# Patient Record
Sex: Male | Born: 1965
Health system: Southern US, Community
[De-identification: ages and names within clinical notes are randomized; demographics above are authoritative.]

## PROBLEM LIST (undated history)

## (undated) DIAGNOSIS — Z972 Presence of dental prosthetic device (complete) (partial): Secondary | ICD-10-CM

## (undated) DIAGNOSIS — E119 Type 2 diabetes mellitus without complications: Secondary | ICD-10-CM

## (undated) DIAGNOSIS — M7501 Adhesive capsulitis of right shoulder: Secondary | ICD-10-CM

## (undated) DIAGNOSIS — E785 Hyperlipidemia, unspecified: Secondary | ICD-10-CM

## (undated) DIAGNOSIS — I10 Essential (primary) hypertension: Secondary | ICD-10-CM

## (undated) HISTORY — PX: TONSILLECTOMY: SUR1361

---

## 1999-01-25 ENCOUNTER — Emergency Department (HOSPITAL_COMMUNITY): Admission: EM | Admit: 1999-01-25 | Discharge: 1999-01-25 | Payer: Self-pay | Admitting: Emergency Medicine

## 2013-12-18 ENCOUNTER — Other Ambulatory Visit: Payer: Self-pay | Admitting: Orthopedic Surgery

## 2014-01-14 ENCOUNTER — Encounter (HOSPITAL_BASED_OUTPATIENT_CLINIC_OR_DEPARTMENT_OTHER): Payer: Self-pay | Admitting: *Deleted

## 2014-01-14 NOTE — Progress Notes (Signed)
To come in for labs ekg-diabetic

## 2014-01-16 ENCOUNTER — Encounter (HOSPITAL_BASED_OUTPATIENT_CLINIC_OR_DEPARTMENT_OTHER)
Admission: RE | Admit: 2014-01-16 | Discharge: 2014-01-16 | Disposition: A | Discharge status: Home / Self Care | Payer: 59 | Source: Ambulatory Visit | Attending: Orthopedic Surgery | Admitting: Orthopedic Surgery

## 2014-01-16 ENCOUNTER — Other Ambulatory Visit: Payer: Self-pay

## 2014-01-16 DIAGNOSIS — I10 Essential (primary) hypertension: Secondary | ICD-10-CM | POA: Diagnosis not present

## 2014-01-16 DIAGNOSIS — M7501 Adhesive capsulitis of right shoulder: Secondary | ICD-10-CM | POA: Diagnosis not present

## 2014-01-16 DIAGNOSIS — Z880 Allergy status to penicillin: Secondary | ICD-10-CM | POA: Diagnosis not present

## 2014-01-16 DIAGNOSIS — Z794 Long term (current) use of insulin: Secondary | ICD-10-CM | POA: Diagnosis not present

## 2014-01-16 DIAGNOSIS — E785 Hyperlipidemia, unspecified: Secondary | ICD-10-CM | POA: Diagnosis not present

## 2014-01-16 DIAGNOSIS — E119 Type 2 diabetes mellitus without complications: Secondary | ICD-10-CM | POA: Diagnosis not present

## 2014-01-16 DIAGNOSIS — F17210 Nicotine dependence, cigarettes, uncomplicated: Secondary | ICD-10-CM | POA: Diagnosis not present

## 2014-01-16 LAB — BASIC METABOLIC PANEL
ANION GAP: 14 (ref 5–15)
BUN: 12 mg/dL (ref 6–23)
CALCIUM: 9.8 mg/dL (ref 8.4–10.5)
CO2: 25 meq/L (ref 19–32)
Chloride: 102 mEq/L (ref 96–112)
Creatinine, Ser: 0.85 mg/dL (ref 0.50–1.35)
GFR calc Af Amer: >90 mL/min (ref >90)
GFR calc non Af Amer: >90 mL/min (ref >90)
Glucose, Bld: 66 mg/dL — ABNORMAL LOW (ref 70–99)
POTASSIUM: 4.2 meq/L (ref 3.7–5.3)
Sodium: 141 mEq/L (ref 137–147)

## 2014-01-16 NOTE — Progress Notes (Deleted)
Dr Al Corpus spoke with pt concerning abnormal ekg.  Dr Al Corpus wants cardiac work up prior to surgery. Pt is very agreeable and voices understanding.  Pt requested Dr Pernell Dupre, unable to get appointment with him till Jan so pt accepted appointment with Dr Jenkins Rouge at 400 pm on the 11-13 15.  Dr. Louanne Skye notified of cardiac work up before surgery performed, understands. He will contact us concerning surgery if postponement is needed.

## 2014-01-17 ENCOUNTER — Ambulatory Visit (HOSPITAL_BASED_OUTPATIENT_CLINIC_OR_DEPARTMENT_OTHER): Payer: 59 | Admitting: Certified Registered"

## 2014-01-17 ENCOUNTER — Ambulatory Visit (HOSPITAL_BASED_OUTPATIENT_CLINIC_OR_DEPARTMENT_OTHER)
Admission: RE | Admit: 2014-01-17 | Discharge: 2014-01-17 | Disposition: A | Discharge status: Home / Self Care | Payer: 59 | Source: Ambulatory Visit | Attending: Orthopedic Surgery | Admitting: Orthopedic Surgery

## 2014-01-17 ENCOUNTER — Encounter (HOSPITAL_BASED_OUTPATIENT_CLINIC_OR_DEPARTMENT_OTHER)
Admission: RE | Disposition: A | Discharge status: Home / Self Care | Payer: Self-pay | Source: Ambulatory Visit | Attending: Orthopedic Surgery

## 2014-01-17 ENCOUNTER — Encounter (HOSPITAL_BASED_OUTPATIENT_CLINIC_OR_DEPARTMENT_OTHER): Payer: Self-pay | Admitting: Certified Registered"

## 2014-01-17 DIAGNOSIS — Z880 Allergy status to penicillin: Secondary | ICD-10-CM | POA: Insufficient documentation

## 2014-01-17 DIAGNOSIS — E785 Hyperlipidemia, unspecified: Secondary | ICD-10-CM | POA: Insufficient documentation

## 2014-01-17 DIAGNOSIS — F17210 Nicotine dependence, cigarettes, uncomplicated: Secondary | ICD-10-CM | POA: Insufficient documentation

## 2014-01-17 DIAGNOSIS — M7501 Adhesive capsulitis of right shoulder: Secondary | ICD-10-CM

## 2014-01-17 DIAGNOSIS — Z794 Long term (current) use of insulin: Secondary | ICD-10-CM | POA: Insufficient documentation

## 2014-01-17 DIAGNOSIS — I10 Essential (primary) hypertension: Secondary | ICD-10-CM | POA: Insufficient documentation

## 2014-01-17 DIAGNOSIS — E119 Type 2 diabetes mellitus without complications: Secondary | ICD-10-CM | POA: Insufficient documentation

## 2014-01-17 HISTORY — DX: Type 2 diabetes mellitus without complications: E11.9

## 2014-01-17 HISTORY — DX: Adhesive capsulitis of right shoulder: M75.01

## 2014-01-17 HISTORY — DX: Essential (primary) hypertension: I10

## 2014-01-17 HISTORY — DX: Hyperlipidemia, unspecified: E78.5

## 2014-01-17 HISTORY — DX: Presence of dental prosthetic device (complete) (partial): Z97.2

## 2014-01-17 HISTORY — PX: CLOSED MANIPULATION SHOULDER WITH STERIOD INJECTION: SHX5611

## 2014-01-17 LAB — GLUCOSE, CAPILLARY
GLUCOSE-CAPILLARY: 224 mg/dL — AB (ref 70–99)
Glucose-Capillary: 224 mg/dL — ABNORMAL HIGH (ref 70–99)

## 2014-01-17 LAB — POCT HEMOGLOBIN-HEMACUE: Hemoglobin: 16.9 g/dL (ref 13.0–17.0)

## 2014-01-17 SURGERY — CLOSED MANIPULATION SHOULDER WITH STEROID INJECTION
Anesthesia: Monitor Anesthesia Care | Site: Shoulder | Laterality: Right

## 2014-01-17 MED ORDER — MIDAZOLAM HCL 2 MG/2ML IJ SOLN
1.0000 mg | INTRAMUSCULAR | Status: DC | PRN
  Administered 2014-01-17: 2 mg via INTRAVENOUS

## 2014-01-17 MED ORDER — PROPOFOL 10 MG/ML IV BOLUS
INTRAVENOUS | Status: DC | PRN
  Administered 2014-01-17: 200 mg via INTRAVENOUS

## 2014-01-17 MED ORDER — HYDROMORPHONE HCL 1 MG/ML IJ SOLN: 0.2500 mg | INTRAMUSCULAR | Status: DC | PRN

## 2014-01-17 MED ORDER — METHYLPREDNISOLONE ACETATE 40 MG/ML IJ SUSP
INTRAMUSCULAR | Status: AC
  Filled 2014-01-17: qty 1

## 2014-01-17 MED ORDER — LIDOCAINE HCL (CARDIAC) 20 MG/ML IV SOLN
INTRAVENOUS | Status: DC | PRN
  Administered 2014-01-17: 30 mg via INTRAVENOUS

## 2014-01-17 MED ORDER — MIDAZOLAM HCL 2 MG/2ML IJ SOLN
INTRAMUSCULAR | Status: AC
  Filled 2014-01-17: qty 2

## 2014-01-17 MED ORDER — BUPIVACAINE HCL (PF) 0.5 % IJ SOLN
INTRAMUSCULAR | Status: DC | PRN
  Administered 2014-01-17: 4 mL

## 2014-01-17 MED ORDER — ONDANSETRON HCL 4 MG/2ML IJ SOLN: 4.0000 mg | Freq: Once | INTRAMUSCULAR | Status: DC | PRN

## 2014-01-17 MED ORDER — OXYCODONE HCL 5 MG/5ML PO SOLN: 5.0000 mg | Freq: Once | ORAL | Status: DC | PRN

## 2014-01-17 MED ORDER — BUPIVACAINE HCL (PF) 0.5 % IJ SOLN
INTRAMUSCULAR | Status: AC
  Filled 2014-01-17: qty 30

## 2014-01-17 MED ORDER — MIDAZOLAM HCL 5 MG/5ML IJ SOLN
INTRAMUSCULAR | Status: DC | PRN
  Administered 2014-01-17: 2 mg via INTRAVENOUS

## 2014-01-17 MED ORDER — ONDANSETRON HCL 4 MG PO TABS: 4.0000 mg | ORAL_TABLET | Freq: Three times a day (TID) | ORAL | Status: DC | PRN

## 2014-01-17 MED ORDER — FENTANYL CITRATE 0.05 MG/ML IJ SOLN
INTRAMUSCULAR | Status: AC
  Filled 2014-01-17: qty 2

## 2014-01-17 MED ORDER — FENTANYL CITRATE 0.05 MG/ML IJ SOLN
INTRAMUSCULAR | Status: AC
Start: 2014-01-17 — End: 2014-01-17
  Filled 2014-01-17: qty 2

## 2014-01-17 MED ORDER — FENTANYL CITRATE 0.05 MG/ML IJ SOLN
50.0000 ug | INTRAMUSCULAR | Status: DC | PRN
  Administered 2014-01-17: 100 ug via INTRAVENOUS

## 2014-01-17 MED ORDER — OXYCODONE-ACETAMINOPHEN 5-325 MG PO TABS: 1.0000 | ORAL_TABLET | Freq: Four times a day (QID) | ORAL | Status: DC | PRN

## 2014-01-17 MED ORDER — METHYLPREDNISOLONE ACETATE 80 MG/ML IJ SUSP
INTRAMUSCULAR | Status: DC | PRN
  Administered 2014-01-17: 80 mg

## 2014-01-17 MED ORDER — ONDANSETRON HCL 4 MG/2ML IJ SOLN
INTRAMUSCULAR | Status: DC | PRN
  Administered 2014-01-17: 4 mg via INTRAVENOUS

## 2014-01-17 MED ORDER — BACLOFEN 10 MG PO TABS: 10.0000 mg | ORAL_TABLET | Freq: Three times a day (TID) | ORAL | Status: DC

## 2014-01-17 MED ORDER — OXYCODONE HCL 5 MG PO TABS: 5.0000 mg | ORAL_TABLET | Freq: Once | ORAL | Status: DC | PRN

## 2014-01-17 MED ORDER — SENNA-DOCUSATE SODIUM 8.6-50 MG PO TABS: 2.0000 | ORAL_TABLET | Freq: Every day | ORAL | Status: DC

## 2014-01-17 MED ORDER — LACTATED RINGERS IV SOLN
INTRAVENOUS | Status: DC
  Administered 2014-01-17: 10:00:00 via INTRAVENOUS

## 2014-01-17 MED ORDER — METHYLPREDNISOLONE ACETATE 80 MG/ML IJ SUSP
INTRAMUSCULAR | Status: AC
  Filled 2014-01-17: qty 1

## 2014-01-17 SURGICAL SUPPLY — 17 items
BLADE 11 SAFETY STRL DISP (BLADE) ×1 IMPLANT
BLADE VORTEX 6.0 (BLADE) IMPLANT
GLOVE BIOGEL PI IND STRL 8 (GLOVE) ×1 IMPLANT
GLOVE BIOGEL PI INDICATOR 8 (GLOVE)
GLOVE ORTHO TXT STRL SZ7.5 (GLOVE) ×3 IMPLANT
GOWN STRL REUS W/ TWL LRG LVL3 (GOWN DISPOSABLE) ×1 IMPLANT
GOWN STRL REUS W/ TWL XL LVL3 (GOWN DISPOSABLE) ×2 IMPLANT
GOWN STRL REUS W/TWL LRG LVL3 (GOWN DISPOSABLE)
GOWN STRL REUS W/TWL XL LVL3 (GOWN DISPOSABLE)
NDL SAFETY ECLIPSE 18X1.5 (NEEDLE) ×1 IMPLANT
NDL SPNL 22GX3.5 QUINCKE BK (NEEDLE) ×1 IMPLANT
NEEDLE HYPO 18GX1.5 SHARP (NEEDLE) ×3
NEEDLE HYPO 22GX1.5 SAFETY (NEEDLE) ×2 IMPLANT
NEEDLE SPNL 22GX3.5 QUINCKE BK (NEEDLE) IMPLANT
PAD ALCOHOL SWAB (MISCELLANEOUS) ×6 IMPLANT
SYR 20CC LL (SYRINGE) ×1 IMPLANT
SYR CONTROL 10ML LL (SYRINGE) ×2 IMPLANT

## 2014-01-17 NOTE — Transfer of Care (Signed)
Immediate Anesthesia Transfer of Care Note  Patient: Michael Fritz  Procedure(s) Performed: Procedure(s): RIGHT SHOULDER MANIPULATION WITH CORTISONE INJECTION (Right)  Patient Location: PACU  Anesthesia Type:MAC  Level of Consciousness: awake, alert , oriented and patient cooperative  Airway & Oxygen Therapy: Patient Spontanous Breathing and Patient connected to face mask oxygen  Post-op Assessment: Report given to PACU RN and Post -op Vital signs reviewed and stable  Post vital signs: Reviewed and stable  Complications: No apparent anesthesia complications

## 2014-01-17 NOTE — Anesthesia Procedure Notes (Addendum)
Anesthesia Regional Block:  Interscalene brachial plexus block  Pre-Anesthetic Checklist: ,, timeout performed, Correct Patient, Correct Site, Correct Laterality, Correct Procedure, Correct Position, site marked, Risks and benefits discussed,  Surgical consent,  Pre-op evaluation,  At surgeon's request and post-op pain management  Laterality: Right  Prep: chloraprep       Needles:  Injection technique: Single-shot  Needle Type: Echogenic Stimulator Needle     Needle Length: 9cm 9 cm Needle Gauge: 22 and 22 G    Additional Needles:  Procedures: ultrasound guided (picture in chart) Interscalene brachial plexus block Narrative:  Start time: 01/17/2014 9:55 AM End time: 01/17/2014 10:00 AM Injection made incrementally with aspirations every 5 mL.  Performed by: Personally   Additional Notes: 25 cc 0.5% marcaine with 1:200 Epi injected easily   Procedure Name: MAC Date/Time: 01/17/2014 10:00 AM Performed by: BLOCKER, TIMOTHY Pre-anesthesia Checklist: Patient identified, Emergency Drugs available, Suction available and Timeout performed Patient Re-evaluated:Patient Re-evaluated prior to inductionOxygen Delivery Method: Circle system utilized

## 2014-01-17 NOTE — Anesthesia Preprocedure Evaluation (Addendum)
Anesthesia Evaluation  Patient identified by MRN, date of birth, ID band Patient awake    Reviewed: Allergy & Precautions, NPO status   Airway Mallampati: II  TM Distance: >3 FB Neck ROM: Full    Dental  (+) Edentulous Upper   Pulmonary Current Smoker,  breath sounds clear to auscultation        Cardiovascular hypertension, Pt. on medications Rhythm:Regular Rate:Normal     Neuro/Psych    GI/Hepatic   Endo/Other  diabetes  Renal/GU      Musculoskeletal   Abdominal   Peds  Hematology   Anesthesia Other Findings   Reproductive/Obstetrics                            Anesthesia Physical Anesthesia Plan  ASA: III  Anesthesia Plan: MAC   Post-op Pain Management: MAC Combined w/ Regional for Post-op pain   Induction:   Airway Management Planned: Natural Airway and Simple Face Mask  Additional Equipment:   Intra-op Plan:   Post-operative Plan:   Informed Consent: I have reviewed the patients History and Physical, chart, labs and discussed the procedure including the risks, benefits and alternatives for the proposed anesthesia with the patient or authorized representative who has indicated his/her understanding and acceptance.     Plan Discussed with: CRNA and Anesthesiologist  Anesthesia Plan Comments:         Anesthesia Quick Evaluation

## 2014-01-17 NOTE — Op Note (Signed)
01/17/2014  10:23 AM  PATIENT:  Michael Fritz    PRE-OPERATIVE DIAGNOSIS:  adhesive capsulitis right shoulder  POST-OPERATIVE DIAGNOSIS:  Same  PROCEDURE:  RIGHT SHOULDER MANIPULATION WITH CORTISONE INJECTION  SURGEON:  Johnny Bridge, MD  PHYSICIAN ASSISTANT: Joya Gaskins, OPA-C  ANESTHESIA:   General  PREOPERATIVE INDICATIONS:  Michael Fritz is a  48 y.o. male with a diagnosis of adhesive capsulitis right shoulder who failed conservative measures and elected for surgical management.    The risks benefits and alternatives were discussed with the patient preoperatively including but not limited to the risks of infection, bleeding, nerve injury, cardiopulmonary complications, the need for revision surgery, among others, and the patient was willing to proceed.  OPERATIVE IMPLANTS: none  OPERATIVE FINDINGS: ROM 0-90 FF, Abd 0-60, ER 0.  Post op 0-170, ER 60, Abd 120  OPERATIVE PROCEDURE: The patient was brought to the OR under general anesthesia and a time out performed, manipulation performed yielding significant lysis of adhesions.  The above named motion was achieved, and the patient was injected with 80mg  depo and 4 mg marcaine from anterior portal.  Tolerated well.  No complications.

## 2014-01-17 NOTE — Discharge Instructions (Signed)
Diet: As you were doing prior to hospitalization   Shower:  Ok to shower.  Activity:  Increase activity slowly as tolerated, but follow the weight bearing instructions below.  OK to use arm to tolerance.  Weight Bearing:   As tolerated.    To prevent constipation: you may use a stool softener such as -  Colace (over the counter) 100 mg by mouth twice a day  Drink plenty of fluids (prune juice may be helpful) and high fiber foods Miralax (over the counter) for constipation as needed.    Itching:  If you experience itching with your medications, try taking only a single pain pill, or even half a pain pill at a time.  You may take up to 10 pain pills per day, and you can also use benadryl over the counter for itching or also to help with sleep.   Precautions:  If you experience chest pain or shortness of breath - call 911 immediately for transfer to the hospital emergency department!!  If you develop a fever greater that 101 F, purulent drainage from wound, increased redness or drainage from wound, or calf pain -- Call the office at [phone]                                                Follow- Up Appointment:  Please call for an appointment to be seen in 2 weeks [place] - [phone]   Regional Anesthesia Blocks  1. Numbness or the inability to move the "blocked" extremity may last from 3-48 hours after placement. The length of time depends on the medication injected and your individual response to the medication. If the numbness is not going away after 48 hours, call your surgeon.  2. The extremity that is blocked will need to be protected until the numbness is gone and the  Strength has returned. Because you cannot feel it, you will need to take extra care to avoid injury. Because it may be weak, you may have difficulty moving it or using it. You may not know what position it is in without looking at it while the block is in effect.  3. For blocks in the legs and feet, returning  to weight bearing and walking needs to be done carefully. You will need to wait until the numbness is entirely gone and the strength has returned. You should be able to move your leg and foot normally before you try and bear weight or walk. You will need someone to be with you when you first try to ensure you do not fall and possibly risk injury.  4. Bruising and tenderness at the needle site are common side effects and will resolve in a few days.  5. Persistent numbness or new problems with movement should be communicated to the surgeon or the [place] [phone] [place] [phone]).    Post Anesthesia Home Care Instructions  Activity: Get plenty of rest for the remainder of the day. A responsible adult should stay with you for 24 hours following the procedure.  For the next 24 hours, DO NOT: -Drive a car -Paediatric nurse -Drink alcoholic beverages -Take any medication unless instructed by your physician -Make any legal decisions or sign important papers.  Meals: Start with liquid foods such as gelatin or soup. Progress to regular foods as tolerated. Avoid greasy, spicy,  heavy foods. If nausea and/or vomiting occur, drink only clear liquids until the nausea and/or vomiting subsides. Call your physician if vomiting continues.  Special Instructions/Symptoms: Your throat may feel dry or sore from the anesthesia or the breathing tube placed in your throat during surgery. If this causes discomfort, gargle with warm salt water. The discomfort should disappear within 24 hours.

## 2014-01-17 NOTE — Anesthesia Postprocedure Evaluation (Signed)
  Anesthesia Post-op Note  Patient: Michael Fritz  Procedure(s) Performed: Procedure(s): RIGHT SHOULDER MANIPULATION WITH CORTISONE INJECTION (Right)  Patient Location: PACU  Anesthesia Type:MAC and MAC combined with regional for post-op pain  Level of Consciousness: awake, alert  and oriented  Airway and Oxygen Therapy: Patient Spontanous Breathing  Post-op Pain: none  Post-op Assessment: Post-op Vital signs reviewed, Patient's Cardiovascular Status Stable, Respiratory Function Stable, Patent Airway, No signs of Nausea or vomiting and Pain level controlled  Post-op Vital Signs: stable  Last Vitals:  Filed Vitals:   01/17/14 1152  BP: 131/57  Pulse: 78  Temp: 36.6 C  Resp: 16    Complications: No apparent anesthesia complications

## 2014-01-17 NOTE — Anesthesia Postprocedure Evaluation (Signed)
  Anesthesia Post-op Note  Patient: Michael Fritz  Procedure(s) Performed: Procedure(s): RIGHT SHOULDER MANIPULATION WITH CORTISONE INJECTION (Right)  Patient Location: PACU  Anesthesia Type: MAC with block  Level of Consciousness: awake and alert   Airway and Oxygen Therapy: Patient Spontanous Breathing  Post-op Pain: none  Post-op Assessment: Post-op Vital signs reviewed, Patient's Cardiovascular Status Stable and Respiratory Function Stable  Post-op Vital Signs: Reviewed  Filed Vitals:   01/17/14 1115  BP: 116/71  Pulse: 81  Temp:   Resp: 18    Complications: No apparent anesthesia complications

## 2014-01-17 NOTE — H&P (Signed)
PREOPERATIVE H&P  Chief Complaint:Right shoulder stiffness and pain  HPI: Michael Fritz is a 48 y.o. male who presents for preoperative history and physical with a diagnosis of ADHESIVE CAPSULITIS OF RIGHT SHOULDER. Symptoms are rated as moderate to severe, and have been worsening.  This is significantly impairing activities of daily living.  He has elected for surgical management. He has failed injections, physical therapy, activity modification, and extended time.  Past Medical History  Diagnosis Date  . Hypertension   . Diabetes mellitus without complication   . Hyperlipemia   . Wears dentures     top   Past Surgical History  Procedure Laterality Date  . Tonsillectomy     History   Social History  . Marital Status: Married    Spouse Name: N/A    Number of Children: N/A  . Years of Education: N/A   Social History Main Topics  . Smoking status: Current Every Day Smoker -- 1.00 packs/day  . Smokeless tobacco: None  . Alcohol Use: Yes     Comment: occ  . Drug Use: No  . Sexual Activity: None   Other Topics Concern  . None   Social History Narrative   History reviewed. No pertinent family history. Allergies  Allergen Reactions  . Penicillins    Prior to Admission medications   Medication Sig Start Date End Date Taking? Authorizing Provider  Canagliflozin-Metformin HCl (INVOKAMET) [phone] MG TABS Take by mouth every morning.   Yes Historical Provider, MD  cephALEXin (KEFLEX) 500 MG capsule Take 500 mg by mouth 2 (two) times daily. Started 01-15-2014  For dental   Yes Historical Provider, MD  HYDROcodone-acetaminophen (NORCO/VICODIN) 5-325 MG per tablet Take 1 tablet by mouth every 6 (six) hours as needed for moderate pain.   Yes Historical Provider, MD  insulin detemir (LEVEMIR) 100 UNIT/ML injection Inject 50 Units into the skin daily.   Yes Historical Provider, MD  losartan (COZAAR) 50 MG tablet Take 50 mg by mouth daily.   Yes Historical Provider, MD   simvastatin (ZOCOR) 40 MG tablet Take 40 mg by mouth daily.   Yes Historical Provider, MD     Positive ROS: All other systems have been reviewed and were otherwise negative with the exception of those mentioned in the HPI and as above.  Physical Exam: General: Alert, no acute distress Cardiovascular: No pedal edema Respiratory: No cyanosis, no use of accessory musculature GI: No organomegaly, abdomen is soft and non-tender Skin: No lesions in the area of chief complaint Neurologic: Sensation intact distally Psychiatric: Patient is competent for consent with normal mood and affect Lymphatic: No axillary or cervical lymphadenopathy  MUSCULOSKELETAL: Right shoulder active forward flexion is 0-95. External rotation is to 0. Rotator cuff strength is limited secondary to pain.  MRI demonstrates evidence for normal rotator cuff musculature with adhesive capsulitis.  Assessment: ADHESIVE CAPSULITIS OF RIGHT SHOULDER/PRIMARY OSTEOARTHRITIS/RIGHT SHOULDER  Plan: Plan for Procedure(s): RIGHT SHOULDER MANIPULATION WITH CORTISONE INJECTION  The risks benefits and alternatives were discussed with the patient including but not limited to the risks of nonoperative treatment, versus surgical intervention including infection, bleeding, nerve injury,  blood clots, cardiopulmonary complications, morbidity, mortality, among others, and they were willing to proceed. We will see discussed the risks for incomplete relief of symptoms, persistent symptoms symptoms, persistent stiffness, as well as the need for future arthroscopic intervention if he does not respond.  Johnny Bridge, MD Cell [phone]   01/17/2014 8:11 AM

## 2014-01-17 NOTE — Progress Notes (Signed)
Assisted Dr. Linna Caprice with right, ultrasound guided, interscalene  block. Side rails up, monitors on throughout procedure. See vital signs in flow sheet. Tolerated Procedure well.

## 2014-01-20 ENCOUNTER — Encounter (HOSPITAL_BASED_OUTPATIENT_CLINIC_OR_DEPARTMENT_OTHER): Payer: Self-pay | Admitting: Orthopedic Surgery

## 2015-12-24 ENCOUNTER — Ambulatory Visit (INDEPENDENT_AMBULATORY_CARE_PROVIDER_SITE_OTHER): Payer: 59 | Admitting: Family Medicine

## 2015-12-24 ENCOUNTER — Encounter: Payer: Self-pay | Admitting: Family Medicine

## 2015-12-24 ENCOUNTER — Other Ambulatory Visit: Payer: Self-pay | Admitting: *Deleted

## 2015-12-24 ENCOUNTER — Ambulatory Visit: Payer: Self-pay | Admitting: Family Medicine

## 2015-12-24 VITALS — BP 138/80 | Ht 72.0 in | Wt 152.5 lb

## 2015-12-24 DIAGNOSIS — E119 Type 2 diabetes mellitus without complications: Secondary | ICD-10-CM

## 2015-12-24 DIAGNOSIS — N528 Other male erectile dysfunction: Secondary | ICD-10-CM | POA: Insufficient documentation

## 2015-12-24 DIAGNOSIS — E7849 Other hyperlipidemia: Secondary | ICD-10-CM

## 2015-12-24 DIAGNOSIS — Z720 Tobacco use: Secondary | ICD-10-CM | POA: Insufficient documentation

## 2015-12-24 DIAGNOSIS — F17200 Nicotine dependence, unspecified, uncomplicated: Secondary | ICD-10-CM | POA: Diagnosis not present

## 2015-12-24 DIAGNOSIS — E784 Other hyperlipidemia: Secondary | ICD-10-CM

## 2015-12-24 DIAGNOSIS — E1169 Type 2 diabetes mellitus with other specified complication: Secondary | ICD-10-CM | POA: Insufficient documentation

## 2015-12-24 DIAGNOSIS — E785 Hyperlipidemia, unspecified: Secondary | ICD-10-CM | POA: Insufficient documentation

## 2015-12-24 LAB — POCT GLYCOSYLATED HEMOGLOBIN (HGB A1C): HEMOGLOBIN A1C: 8.7

## 2015-12-24 MED ORDER — INSULIN DETEMIR 100 UNIT/ML ~~LOC~~ SOLN: SUBCUTANEOUS | 6 refills | Status: DC

## 2015-12-24 MED ORDER — VARENICLINE TARTRATE 1 MG PO TABS: 1.0000 mg | ORAL_TABLET | Freq: Two times a day (BID) | ORAL | 1 refills | Status: DC

## 2015-12-24 MED ORDER — LOSARTAN POTASSIUM 50 MG PO TABS: 50.0000 mg | ORAL_TABLET | Freq: Every day | ORAL | 6 refills | Status: DC

## 2015-12-24 MED ORDER — EMPAGLIFLOZIN 25 MG PO TABS: 25.0000 mg | ORAL_TABLET | Freq: Every day | ORAL | 6 refills | Status: DC

## 2015-12-24 MED ORDER — SIMVASTATIN 40 MG PO TABS: 40.0000 mg | ORAL_TABLET | Freq: Every day | ORAL | 6 refills | Status: DC

## 2015-12-24 MED ORDER — SILDENAFIL CITRATE 20 MG PO TABS: ORAL_TABLET | ORAL | 12 refills | Status: DC

## 2015-12-24 MED ORDER — METFORMIN HCL 500 MG PO TABS: 500.0000 mg | ORAL_TABLET | Freq: Two times a day (BID) | ORAL | 6 refills | Status: DC

## 2015-12-24 MED ORDER — VARENICLINE TARTRATE 0.5 MG X 11 & 1 MG X 42 PO MISC: ORAL | 0 refills | Status: DC

## 2015-12-24 NOTE — Progress Notes (Signed)
   Subjective:    Patient ID: Michael Fritz, male    DOB: [birth year], 50 y.o.   MRN: [id number]  Diabetes  He presents for his follow-up diabetic visit. He has type 2 diabetes mellitus. There are no hypoglycemic associated symptoms. Pertinent negatives for hypoglycemia include no confusion. There are no diabetic associated symptoms. Pertinent negatives for diabetes include no chest pain, no fatigue, no polydipsia, no polyphagia and no weakness. There are no hypoglycemic complications. There are no diabetic complications. There are no known risk factors for coronary artery disease. Current diabetic treatment includes insulin injections. He is compliant with treatment all of the time.   Results for orders placed or performed in visit on 12/24/15  POCT glycosylated hemoglobin (Hb A1C)  Result Value Ref Range   Hemoglobin A1C 8.7    Patient had a diabetic eye exam about 1 1/2 years ago. Patient does take close are tender help protect his kidneys He does smoke we talked at length about the importance of quitting smoking He has tried Wellbutrin in the past one time it did not work another time it did work Patient states that he has no concerns at this time.  Patient states he's had 2 separate times he been admitted into the hospital because his sugars were really high he thinks he was in DKA but he does not know he currently is already on Invokana on metformin combination states it's done well for him he has been on short acting insulin before but he had some low spells so he stopped taking it he is only on left long-acting currently he does take cholesterol medicine on a regular basis.  Review of Systems  Constitutional: Negative for activity change, appetite change and fatigue.  HENT: Negative for congestion.   Respiratory: Negative for cough.   Cardiovascular: Negative for chest pain.  Gastrointestinal: Negative for abdominal pain.  Endocrine: Negative for polydipsia and polyphagia.    Neurological: Negative for weakness.  Psychiatric/Behavioral: Negative for confusion.       Objective:   Physical Exam  Constitutional: He appears well-nourished. No distress.  Cardiovascular: Normal rate, regular rhythm and normal heart sounds.   No murmur heard. Pulmonary/Chest: Effort normal and breath sounds normal. No respiratory distress.  Musculoskeletal: He exhibits no edema.  Lymphadenopathy:    He has no cervical adenopathy.  Neurological: He is alert.  Psychiatric: His behavior is normal.  Vitals reviewed.   Diabetic foot exam completed without difficulty normal Warning signs regarding Chantix side effects discussed with the patient he understands he will stop the medicine if depressed severe dreams or suicidal ideation he will immediately seek help     Assessment & Plan:  Diabetes-this patient more than likely has a pancreas that is no longer producing insulin we will check a C-peptide level. He will continue long-acting insulin. I recommend that he check his sugars more often send [place] some readings every few weeks and we can make adjustments  Hyperlipidemia according to the patient had lab work done about a month ago and it was good we will get the results of that continue current medication  Blood pressure good control  Smoking-I've encouraged patient to quit smoking. We will send in a prescription for Chantix for him  Erectile dysfunction sildenafil prescribed he gets this through a local pharmacy  Stop Invokana. We will use Jardiance

## 2015-12-24 NOTE — Patient Instructions (Signed)
Diabetes Mellitus and Food It is important for you to manage your blood sugar (glucose) level. Your blood glucose level can be greatly affected by what you eat. Eating healthier foods in the appropriate amounts throughout the day at about the same time each day will help you control your blood glucose level. It can also help slow or prevent worsening of your diabetes mellitus. Healthy eating may even help you improve the level of your blood pressure and reach or maintain a healthy weight.  General recommendations for healthful eating and cooking habits include:  Eating meals and snacks regularly. Avoid going long periods of time without eating to lose weight.  Eating a diet that consists mainly of plant-based foods, such as fruits, vegetables, nuts, legumes, and whole grains.  Using low-heat cooking methods, such as baking, instead of high-heat cooking methods, such as deep frying. Work with your dietitian to make sure you understand how to use the Nutrition Facts information on food labels. HOW CAN FOOD AFFECT ME? Carbohydrates Carbohydrates affect your blood glucose level more than any other type of food. Your dietitian will help you determine how many carbohydrates to eat at each meal and teach you how to count carbohydrates. Counting carbohydrates is important to keep your blood glucose at a healthy level, especially if you are using insulin or taking certain medicines for diabetes mellitus. Alcohol Alcohol can cause sudden decreases in blood glucose (hypoglycemia), especially if you use insulin or take certain medicines for diabetes mellitus. Hypoglycemia can be a life-threatening condition. Symptoms of hypoglycemia (sleepiness, dizziness, and disorientation) are similar to symptoms of having too much alcohol.  If your health care provider has given you approval to drink alcohol, do so in moderation and use the following guidelines:  Women should not have more than one drink per day, and men  should not have more than two drinks per day. One drink is equal to:  12 oz of beer.  5 oz of wine.  1 oz of hard liquor.  Do not drink on an empty stomach.  Keep yourself hydrated. Have water, diet soda, or unsweetened iced tea.  Regular soda, juice, and other mixers might contain a lot of carbohydrates and should be counted. WHAT FOODS ARE NOT RECOMMENDED? As you make food choices, it is important to remember that all foods are not the same. Some foods have fewer nutrients per serving than other foods, even though they might have the same number of calories or carbohydrates. It is difficult to get your body what it needs when you eat foods with fewer nutrients. Examples of foods that you should avoid that are high in calories and carbohydrates but low in nutrients include:  Trans fats (most processed foods list trans fats on the Nutrition Facts label).  Regular soda.  Juice.  Candy.  Sweets, such as cake, pie, doughnuts, and cookies.  Fried foods. WHAT FOODS CAN I EAT? Eat nutrient-rich foods, which will nourish your body and keep you healthy. The food you should eat also will depend on several factors, including:  The calories you need.  The medicines you take.  Your weight.  Your blood glucose level.  Your blood pressure level.  Your cholesterol level. You should eat a variety of foods, including:  Protein.  Lean cuts of meat.  Proteins low in saturated fats, such as fish, egg whites, and beans. Avoid processed meats.  Fruits and vegetables.  Fruits and vegetables that may help control blood glucose levels, such as apples, mangoes, and   yams.  Dairy products.  Choose fat-free or low-fat dairy products, such as milk, yogurt, and cheese.  Grains, bread, pasta, and rice.  Choose whole grain products, such as multigrain bread, whole oats, and brown rice. These foods may help control blood pressure.  Fats.  Foods containing healthful fats, such as nuts,  avocado, olive oil, canola oil, and fish. DOES EVERYONE WITH DIABETES MELLITUS HAVE THE SAME MEAL PLAN? Because every person with diabetes mellitus is different, there is not one meal plan that works for everyone. It is very important that you meet with a dietitian who will help you create a meal plan that is just right for you.   This information is not intended to replace advice given to you by your health care provider. Make sure you discuss any questions you have with your health care provider.   Document Released: 11/18/2004 Document Revised: 03/14/2014 Document Reviewed: 01/18/2013 Elsevier Interactive Patient Education 2016 [employer]  

## 2015-12-29 ENCOUNTER — Encounter: Payer: Self-pay | Admitting: Family Medicine

## 2016-01-01 ENCOUNTER — Other Ambulatory Visit: Payer: Self-pay | Admitting: *Deleted

## 2016-01-01 ENCOUNTER — Telehealth: Payer: Self-pay | Admitting: *Deleted

## 2016-01-01 MED ORDER — INSULIN DETEMIR 100 UNIT/ML FLEXPEN: 64.0000 [IU] | PEN_INJECTOR | Freq: Every day | SUBCUTANEOUS | 5 refills | Status: DC

## 2016-01-01 NOTE — Telephone Encounter (Signed)
Discussed with pt

## 2016-01-01 NOTE — Telephone Encounter (Signed)
Left message to return call 

## 2016-01-01 NOTE — Telephone Encounter (Signed)
Left message to return call. Working on [place] for sildenafil. Insurance will not cover any ED meds. If pt wants he needs to pay out of pocket.   Still working on [employer].

## 2016-01-01 NOTE — Telephone Encounter (Signed)
Per Dr Nicki Reaper: Please call patient to remind him to have his lab work drawn that was ordered at his recent office visit

## 2016-01-01 NOTE — Telephone Encounter (Signed)
Pt states he will do bloodwork on tues

## 2016-01-04 ENCOUNTER — Telehealth: Payer: Self-pay | Admitting: Family Medicine

## 2016-01-04 NOTE — Telephone Encounter (Signed)
Left message to return call 

## 2016-01-04 NOTE — Telephone Encounter (Signed)
The patient will have to choose if he once to pay for this out of pocket or not

## 2016-01-04 NOTE — Telephone Encounter (Signed)
Patient's Chantix was denied through patients insurance. Please advise?

## 2016-01-06 NOTE — Telephone Encounter (Signed)
Discussed with pt. Pt states med is over $400 and he cannot afford

## 2016-04-13 ENCOUNTER — Other Ambulatory Visit: Payer: Self-pay | Admitting: Family Medicine

## 2016-06-28 ENCOUNTER — Other Ambulatory Visit: Payer: Self-pay | Admitting: Family Medicine

## 2016-09-22 ENCOUNTER — Telehealth: Payer: Self-pay | Admitting: Family Medicine

## 2016-09-22 DIAGNOSIS — E7849 Other hyperlipidemia: Secondary | ICD-10-CM

## 2016-09-22 DIAGNOSIS — Z79899 Other long term (current) drug therapy: Secondary | ICD-10-CM

## 2016-09-22 DIAGNOSIS — E119 Type 2 diabetes mellitus without complications: Secondary | ICD-10-CM

## 2016-09-22 DIAGNOSIS — Z125 Encounter for screening for malignant neoplasm of prostate: Secondary | ICD-10-CM

## 2016-09-22 NOTE — Telephone Encounter (Signed)
Blood work ordered in EPIC. Patient notified. 

## 2016-09-22 NOTE — Telephone Encounter (Signed)
c-peptide and hgb A1c 12/2015- no other labs

## 2016-09-22 NOTE — Telephone Encounter (Signed)
Lip liv m7 A1c psa urine protein

## 2016-09-22 NOTE — Telephone Encounter (Signed)
Patient has an appointment on 10/07/16 with Dr. Nicki Reaper.  He wants to know if he needs to do labs beforehand?

## 2016-10-01 DIAGNOSIS — E784 Other hyperlipidemia: Secondary | ICD-10-CM | POA: Diagnosis not present

## 2016-10-01 DIAGNOSIS — Z79899 Other long term (current) drug therapy: Secondary | ICD-10-CM | POA: Diagnosis not present

## 2016-10-01 DIAGNOSIS — E119 Type 2 diabetes mellitus without complications: Secondary | ICD-10-CM | POA: Diagnosis not present

## 2016-10-03 LAB — HEPATIC FUNCTION PANEL
ALK PHOS: 73 IU/L (ref 39–117)
ALT: 27 IU/L (ref 0–44)
AST: 32 IU/L (ref 0–40)
Albumin: 4.4 g/dL (ref 3.5–5.5)
Bilirubin Total: 0.5 mg/dL (ref 0.0–1.2)
Bilirubin, Direct: 0.15 mg/dL (ref 0.00–0.40)
Total Protein: 6.8 g/dL (ref 6.0–8.5)

## 2016-10-03 LAB — BASIC METABOLIC PANEL
BUN / CREAT RATIO: 18 (ref 9–20)
BUN: 21 mg/dL (ref 6–24)
CHLORIDE: 100 mmol/L (ref 96–106)
CO2: 19 mmol/L — ABNORMAL LOW (ref 20–29)
Calcium: 9.6 mg/dL (ref 8.7–10.2)
Creatinine, Ser: 1.14 mg/dL (ref 0.76–1.27)
GFR calc Af Amer: 85 mL/min/{1.73_m2} (ref >59)
GFR, EST NON AFRICAN AMERICAN: 74 mL/min/{1.73_m2} (ref >59)
GLUCOSE: 428 mg/dL — AB (ref 65–99)
Potassium: 5.8 mmol/L — ABNORMAL HIGH (ref 3.5–5.2)
Sodium: 139 mmol/L (ref 134–144)

## 2016-10-03 LAB — MICROALBUMIN / CREATININE URINE RATIO
Creatinine, Urine: 20.5 mg/dL
Microalb/Creat Ratio: <14.6 mg/g creat (ref 0.0–30.0)
Microalbumin, Urine: <3 ug/mL

## 2016-10-03 LAB — LIPID PANEL
CHOLESTEROL TOTAL: 200 mg/dL — AB (ref 100–199)
Chol/HDL Ratio: 3.2 ratio (ref 0.0–5.0)
HDL: 62 mg/dL (ref >39)
LDL Calculated: 84 mg/dL (ref 0–99)
Triglycerides: 268 mg/dL — ABNORMAL HIGH (ref 0–149)
VLDL CHOLESTEROL CAL: 54 mg/dL — AB (ref 5–40)

## 2016-10-03 LAB — HEMOGLOBIN A1C
Est. average glucose Bld gHb Est-mCnc: 280 mg/dL
HEMOGLOBIN A1C: 11.4 % — AB (ref 4.8–5.6)

## 2016-10-03 LAB — PSA: Prostate Specific Ag, Serum: 0.2 ng/mL (ref 0.0–4.0)

## 2016-10-07 ENCOUNTER — Encounter: Payer: Self-pay | Admitting: Family Medicine

## 2016-10-07 ENCOUNTER — Ambulatory Visit (INDEPENDENT_AMBULATORY_CARE_PROVIDER_SITE_OTHER): Payer: 59 | Admitting: Family Medicine

## 2016-10-07 VITALS — BP 130/78 | Ht 72.0 in | Wt 152.0 lb

## 2016-10-07 DIAGNOSIS — Z79899 Other long term (current) drug therapy: Secondary | ICD-10-CM | POA: Diagnosis not present

## 2016-10-07 DIAGNOSIS — E784 Other hyperlipidemia: Secondary | ICD-10-CM

## 2016-10-07 DIAGNOSIS — E7849 Other hyperlipidemia: Secondary | ICD-10-CM

## 2016-10-07 DIAGNOSIS — E1165 Type 2 diabetes mellitus with hyperglycemia: Secondary | ICD-10-CM

## 2016-10-07 MED ORDER — SIMVASTATIN 40 MG PO TABS: 40.0000 mg | ORAL_TABLET | Freq: Every day | ORAL | 6 refills | Status: DC

## 2016-10-07 MED ORDER — INSULIN ASPART 100 UNIT/ML FLEXPEN: PEN_INJECTOR | SUBCUTANEOUS | 5 refills | Status: DC

## 2016-10-07 MED ORDER — METFORMIN HCL 500 MG PO TABS: 500.0000 mg | ORAL_TABLET | Freq: Two times a day (BID) | ORAL | 6 refills | Status: DC

## 2016-10-07 MED ORDER — LOSARTAN POTASSIUM 50 MG PO TABS: 50.0000 mg | ORAL_TABLET | Freq: Every day | ORAL | 6 refills | Status: DC

## 2016-10-07 MED ORDER — EMPAGLIFLOZIN 25 MG PO TABS: 25.0000 mg | ORAL_TABLET | Freq: Every day | ORAL | 6 refills | Status: DC

## 2016-10-07 MED ORDER — BUPROPION HCL ER (SR) 150 MG PO TB12: 150.0000 mg | ORAL_TABLET | Freq: Two times a day (BID) | ORAL | 4 refills | Status: DC

## 2016-10-07 NOTE — Progress Notes (Signed)
   Subjective:    Patient ID: Michael Fritz, male    DOB: Nov 16, 1965, 51 y.o.   MRN: [id number]  Diabetes  He presents for his follow-up diabetic visit. He has type 2 diabetes mellitus. Pertinent negatives for hypoglycemia include no confusion. Pertinent negatives for diabetes include no chest pain, no fatigue, no polydipsia, no polyphagia and no weakness. He is following a diabetic diet. Exercise: walking. Home blood sugar record trend: up and down. He does not see a podiatrist.Eye exam is not current.  A1C done on bloodwork 6 days ago. 11.4 Patient smokes he knows he needs to quit he's been counseled to do so Patient does relate feeling stressed anxious at times denies being depressed not suicidal. Pay states he has taken Wellbutrin in the past to help with anxiety Pt states no concerns today.  Patient does take his cholesterol medicine watch diet Takes blood pressure medicine watch his diet Uses his diabetes medicine but states he has not titrated up on his long-acting insulin He states he failed to follow-up as planned   Review of Systems  Constitutional: Negative for activity change, appetite change and fatigue.  HENT: Negative for congestion.   Respiratory: Negative for cough.   Cardiovascular: Negative for chest pain.  Gastrointestinal: Negative for abdominal pain.  Endocrine: Negative for polydipsia and polyphagia.  Neurological: Negative for weakness.  Psychiatric/Behavioral: Negative for confusion.       Objective:   Physical Exam  Constitutional: He appears well-nourished. No distress.  Cardiovascular: Normal rate, regular rhythm and normal heart sounds.   No murmur heard. Pulmonary/Chest: Effort normal and breath sounds normal. No respiratory distress.  Musculoskeletal: He exhibits no edema.  Lymphadenopathy:    He has no cervical adenopathy.  Neurological: He is alert.  Psychiatric: His behavior is normal.  Vitals reviewed.   Patient does not want to be on  control nerve pills      Assessment & Plan:  HTN good control Diabetes poor control continue medications titrate accordingly start short acting insulin Monitor diet stay active Patient encouraged quit smoking Mild anxiety stress-related issues try Wellbutrin 150 mg twice a day for the next few months E how this does   Wellness exam along with diabetes on next visit

## 2016-10-07 NOTE — Patient Instructions (Signed)
Diabetes Mellitus and Food It is important for you to manage your blood sugar (glucose) level. Your blood glucose level can be greatly affected by what you eat. Eating healthier foods in the appropriate amounts throughout the day at about the same time each day will help you control your blood glucose level. It can also help slow or prevent worsening of your diabetes mellitus. Healthy eating may even help you improve the level of your blood pressure and reach or maintain a healthy weight. General recommendations for healthful eating and cooking habits include:  Eating meals and snacks regularly. Avoid going long periods of time without eating to lose weight.  Eating a diet that consists mainly of plant-based foods, such as fruits, vegetables, nuts, legumes, and whole grains.  Using low-heat cooking methods, such as baking, instead of high-heat cooking methods, such as deep frying.  Work with your dietitian to make sure you understand how to use the Nutrition Facts information on food labels. How can food affect me? Carbohydrates Carbohydrates affect your blood glucose level more than any other type of food. Your dietitian will help you determine how many carbohydrates to eat at each meal and teach you how to count carbohydrates. Counting carbohydrates is important to keep your blood glucose at a healthy level, especially if you are using insulin or taking certain medicines for diabetes mellitus. Alcohol Alcohol can cause sudden decreases in blood glucose (hypoglycemia), especially if you use insulin or take certain medicines for diabetes mellitus. Hypoglycemia can be a life-threatening condition. Symptoms of hypoglycemia (sleepiness, dizziness, and disorientation) are similar to symptoms of having too much alcohol. If your health care provider has given you approval to drink alcohol, do so in moderation and use the following guidelines:  Women should not have more than one drink per day, and men  should not have more than two drinks per day. One drink is equal to: ? 12 oz of beer. ? 5 oz of wine. ? 1 oz of hard liquor.  Do not drink on an empty stomach.  Keep yourself hydrated. Have water, diet soda, or unsweetened iced tea.  Regular soda, juice, and other mixers might contain a lot of carbohydrates and should be counted.  What foods are not recommended? As you make food choices, it is important to remember that all foods are not the same. Some foods have fewer nutrients per serving than other foods, even though they might have the same number of calories or carbohydrates. It is difficult to get your body what it needs when you eat foods with fewer nutrients. Examples of foods that you should avoid that are high in calories and carbohydrates but low in nutrients include:  Trans fats (most processed foods list trans fats on the Nutrition Facts label).  Regular soda.  Juice.  Candy.  Sweets, such as cake, pie, doughnuts, and cookies.  Fried foods.  What foods can I eat? Eat nutrient-rich foods, which will nourish your body and keep you healthy. The food you should eat also will depend on several factors, including:  The calories you need.  The medicines you take.  Your weight.  Your blood glucose level.  Your blood pressure level.  Your cholesterol level.  You should eat a variety of foods, including:  Protein. ? Lean cuts of meat. ? Proteins low in saturated fats, such as fish, egg whites, and beans. Avoid processed meats.  Fruits and vegetables. ? Fruits and vegetables that may help control blood glucose levels, such as apples,   mangoes, and yams.  Dairy products. ? Choose fat-free or low-fat dairy products, such as milk, yogurt, and cheese.  Grains, bread, pasta, and rice. ? Choose whole grain products, such as multigrain bread, whole oats, and brown rice. These foods may help control blood pressure.  Fats. ? Foods containing healthful fats, such as  nuts, avocado, olive oil, canola oil, and fish.  Does everyone with diabetes mellitus have the same meal plan? Because every person with diabetes mellitus is different, there is not one meal plan that works for everyone. It is very important that you meet with a dietitian who will help you create a meal plan that is just right for you. This information is not intended to replace advice given to you by your health care provider. Make sure you discuss any questions you have with your health care provider. Document Released: 11/18/2004 Document Revised: 07/30/2015 Document Reviewed: 01/18/2013 Elsevier Interactive Patient Education  2017 Elsevier Inc.  

## 2016-10-13 ENCOUNTER — Telehealth: Payer: Self-pay | Admitting: Family Medicine

## 2016-10-13 NOTE — Telephone Encounter (Signed)
Left message to return call 

## 2016-10-13 NOTE — Telephone Encounter (Signed)
Clarification - is the med Jardiance?

## 2016-10-13 NOTE — Telephone Encounter (Signed)
Nurse's-I had called the patient this past weekend left a message on his phone recommending stopping Giardia and's. This is because I don't feel it will benefit him plus also there are other medicines we can use for his diabetes if necessary. Combination of Girardi and's with short acting and long-acting insulin increases her risk of diabetic ketoacidosis. Therefore stop Girardi and's. DC it from medicine list. The patient should do a follow-up met 7 nonfasting through [facility] in approximately 2-3 weeks. Also please mail the patient glucose sheets that he can fill in some of his readings and send back to me in several weeks so we can adjust insulin

## 2016-10-13 NOTE — Telephone Encounter (Signed)
Yes Jardiance

## 2016-10-17 NOTE — Telephone Encounter (Signed)
Spoke with patient and informed him per Dr.Scott Luking- Need to stop Jaridance and repeat a BMET in 2-3 weeks. Patient verbalized understanding.

## 2016-10-17 NOTE — Telephone Encounter (Signed)
Left message return call 10/17/16

## 2016-10-20 ENCOUNTER — Other Ambulatory Visit: Payer: Self-pay | Admitting: Family Medicine

## 2016-11-14 DIAGNOSIS — E1165 Type 2 diabetes mellitus with hyperglycemia: Secondary | ICD-10-CM | POA: Diagnosis not present

## 2016-11-14 DIAGNOSIS — Z79899 Other long term (current) drug therapy: Secondary | ICD-10-CM | POA: Diagnosis not present

## 2016-11-15 ENCOUNTER — Other Ambulatory Visit: Payer: Self-pay | Admitting: Family Medicine

## 2016-11-15 LAB — BASIC METABOLIC PANEL
BUN / CREAT RATIO: 16 (ref 9–20)
BUN: 17 mg/dL (ref 6–24)
CO2: 24 mmol/L (ref 20–29)
Calcium: 9.4 mg/dL (ref 8.7–10.2)
Chloride: 98 mmol/L (ref 96–106)
Creatinine, Ser: 1.06 mg/dL (ref 0.76–1.27)
GFR calc Af Amer: 93 mL/min/{1.73_m2} (ref >59)
GFR, EST NON AFRICAN AMERICAN: 80 mL/min/{1.73_m2} (ref >59)
GLUCOSE: 421 mg/dL — AB (ref 65–99)
Potassium: 4.8 mmol/L (ref 3.5–5.2)
Sodium: 138 mmol/L (ref 134–144)

## 2016-11-15 LAB — HEMOGLOBIN A1C
Est. average glucose Bld gHb Est-mCnc: 269 mg/dL
Hgb A1c MFr Bld: 11 % — ABNORMAL HIGH (ref 4.8–5.6)

## 2016-12-01 ENCOUNTER — Other Ambulatory Visit: Payer: Self-pay | Admitting: Family Medicine

## 2016-12-01 ENCOUNTER — Telehealth: Payer: Self-pay | Admitting: Family Medicine

## 2016-12-01 NOTE — Telephone Encounter (Signed)
Needs test strips for One Touch Ulta Glucose Meter.  [facility] on [address].

## 2016-12-01 NOTE — Telephone Encounter (Signed)
Left message to return call. Need to ask pt how many times a day he is testing and write it on the script.

## 2016-12-01 NOTE — Telephone Encounter (Signed)
Patient called back stating that he test blood sugars 3 times per day. Script faxed to pharmacy.

## 2016-12-06 ENCOUNTER — Other Ambulatory Visit: Payer: Self-pay | Admitting: Family Medicine

## 2016-12-06 NOTE — Telephone Encounter (Signed)
Patient last seen 10/07/16

## 2017-02-09 ENCOUNTER — Ambulatory Visit: Payer: 59 | Admitting: Family Medicine

## 2017-02-09 ENCOUNTER — Encounter: Payer: Self-pay | Admitting: Family Medicine

## 2017-02-09 VITALS — BP 162/82 | Ht 72.0 in | Wt 163.0 lb

## 2017-02-09 DIAGNOSIS — E7849 Other hyperlipidemia: Secondary | ICD-10-CM

## 2017-02-09 DIAGNOSIS — Z1159 Encounter for screening for other viral diseases: Secondary | ICD-10-CM | POA: Diagnosis not present

## 2017-02-09 DIAGNOSIS — Z114 Encounter for screening for human immunodeficiency virus [HIV]: Secondary | ICD-10-CM | POA: Diagnosis not present

## 2017-02-09 DIAGNOSIS — Z23 Encounter for immunization: Secondary | ICD-10-CM | POA: Diagnosis not present

## 2017-02-09 DIAGNOSIS — E119 Type 2 diabetes mellitus without complications: Secondary | ICD-10-CM | POA: Diagnosis not present

## 2017-02-09 LAB — POCT GLYCOSYLATED HEMOGLOBIN (HGB A1C): Hemoglobin A1C: 8.8

## 2017-02-09 NOTE — Progress Notes (Signed)
   Subjective:    Patient ID: Michael Fritz, male    DOB: [birth year], 51 y.o.   MRN: [id number]  HPI Patient is here today to follow up on Dm. He Is currently on Levemir 56 u QD, Novolog prn,Metformin 500 mg one BID. He does not eat very healthy and does get some exercise at work.He does not see a podiatrist and is due to see his eye soon and plans on giving them a call to set up. Patient with significant diabetes he has been using his long-acting insulin but not using a short acting insulin his morning numbers are very high during the day they are in the mid 100s he denies any low spells tries to eat fair healthy but states he does eat a lot of other stuff  Review of Systems  Constitutional: Negative for activity change, appetite change and fever.  HENT: Negative for congestion and rhinorrhea.   Eyes: Negative for discharge.  Respiratory: Negative for cough and wheezing.   Cardiovascular: Negative for chest pain.  Gastrointestinal: Negative for abdominal pain, blood in stool and vomiting.  Genitourinary: Negative for difficulty urinating and frequency.  Musculoskeletal: Negative for neck pain.  Skin: Negative for rash.  Allergic/Immunologic: Negative for environmental allergies and food allergies.  Neurological: Negative for weakness and headaches.  Psychiatric/Behavioral: Negative for agitation.       Results for orders placed or performed in visit on 02/09/17  POCT glycosylated hemoglobin (Hb A1C)  Result Value Ref Range   Hemoglobin A1C 8.8     Objective:   Physical Exam  Constitutional: He appears well-nourished. No distress.  Cardiovascular: Normal rate, regular rhythm and normal heart sounds.  No murmur heard. Pulmonary/Chest: Effort normal and breath sounds normal. No respiratory distress.  Musculoskeletal: He exhibits no edema.  Lymphadenopathy:    He has no cervical adenopathy.  Neurological: He is alert.  Psychiatric: His behavior is normal.  Vitals  reviewed.  Diabetic foot exam normal patient was encouraged to get his eyes checked with ophthalmology       Assessment & Plan:  Diabetes some improvement is noted compared to where we have been encouraged patient to titrate long-acting insulin to get morning sugars down around 100 also encourage patient to use short acting insulin to keep his numbers from going high but it is likely he will not use a short acting insulin during the day we will do comprehensive lab work before the next visit in 4 months

## 2017-02-09 NOTE — Patient Instructions (Signed)
Diabetes Mellitus and Food It is important for you to manage your blood sugar (glucose) level. Your blood glucose level can be greatly affected by what you eat. Eating healthier foods in the appropriate amounts throughout the day at about the same time each day will help you control your blood glucose level. It can also help slow or prevent worsening of your diabetes mellitus. Healthy eating may even help you improve the level of your blood pressure and reach or maintain a healthy weight. General recommendations for healthful eating and cooking habits include:  Eating meals and snacks regularly. Avoid going long periods of time without eating to lose weight.  Eating a diet that consists mainly of plant-based foods, such as fruits, vegetables, nuts, legumes, and whole grains.  Using low-heat cooking methods, such as baking, instead of high-heat cooking methods, such as deep frying.  Work with your dietitian to make sure you understand how to use the Nutrition Facts information on food labels. How can food affect me? Carbohydrates Carbohydrates affect your blood glucose level more than any other type of food. Your dietitian will help you determine how many carbohydrates to eat at each meal and teach you how to count carbohydrates. Counting carbohydrates is important to keep your blood glucose at a healthy level, especially if you are using insulin or taking certain medicines for diabetes mellitus. Alcohol Alcohol can cause sudden decreases in blood glucose (hypoglycemia), especially if you use insulin or take certain medicines for diabetes mellitus. Hypoglycemia can be a life-threatening condition. Symptoms of hypoglycemia (sleepiness, dizziness, and disorientation) are similar to symptoms of having too much alcohol. If your health care provider has given you approval to drink alcohol, do so in moderation and use the following guidelines:  Women should not have more than one drink per day, and men  should not have more than two drinks per day. One drink is equal to: ? 12 oz of beer. ? 5 oz of wine. ? 1 oz of hard liquor.  Do not drink on an empty stomach.  Keep yourself hydrated. Have water, diet soda, or unsweetened iced tea.  Regular soda, juice, and other mixers might contain a lot of carbohydrates and should be counted.  What foods are not recommended? As you make food choices, it is important to remember that all foods are not the same. Some foods have fewer nutrients per serving than other foods, even though they might have the same number of calories or carbohydrates. It is difficult to get your body what it needs when you eat foods with fewer nutrients. Examples of foods that you should avoid that are high in calories and carbohydrates but low in nutrients include:  Trans fats (most processed foods list trans fats on the Nutrition Facts label).  Regular soda.  Juice.  Candy.  Sweets, such as cake, pie, doughnuts, and cookies.  Fried foods.  What foods can I eat? Eat nutrient-rich foods, which will nourish your body and keep you healthy. The food you should eat also will depend on several factors, including:  The calories you need.  The medicines you take.  Your weight.  Your blood glucose level.  Your blood pressure level.  Your cholesterol level.  You should eat a variety of foods, including:  Protein. ? Lean cuts of meat. ? Proteins low in saturated fats, such as fish, egg whites, and beans. Avoid processed meats.  Fruits and vegetables. ? Fruits and vegetables that may help control blood glucose levels, such as apples,   mangoes, and yams.  Dairy products. ? Choose fat-free or low-fat dairy products, such as milk, yogurt, and cheese.  Grains, bread, pasta, and rice. ? Choose whole grain products, such as multigrain bread, whole oats, and brown rice. These foods may help control blood pressure.  Fats. ? Foods containing healthful fats, such as  nuts, avocado, olive oil, canola oil, and fish.  Does everyone with diabetes mellitus have the same meal plan? Because every person with diabetes mellitus is different, there is not one meal plan that works for everyone. It is very important that you meet with a dietitian who will help you create a meal plan that is just right for you. This information is not intended to replace advice given to you by your health care provider. Make sure you discuss any questions you have with your health care provider. Document Released: 11/18/2004 Document Revised: 07/30/2015 Document Reviewed: 01/18/2013 Elsevier Interactive Patient Education  2017 Elsevier Inc.  

## 2017-04-11 ENCOUNTER — Telehealth: Payer: Self-pay | Admitting: Family Medicine

## 2017-04-11 NOTE — Telephone Encounter (Signed)
Please advise.Thanks,CS

## 2017-04-11 NOTE — Telephone Encounter (Signed)
Pt's Levemir copay went way up ($230.00/month)  Pharmacist called to ask if we would be willing to change pt to Basaglar ($25.00/month copay)  Please advise    [facility]

## 2017-04-12 MED ORDER — BASAGLAR KWIKPEN 100 UNIT/ML ~~LOC~~ SOPN: 64.0000 [IU] | PEN_INJECTOR | Freq: Every day | SUBCUTANEOUS | 5 refills | Status: DC

## 2017-04-12 NOTE — Telephone Encounter (Signed)
Prescription was sent electronically to pharmacy. Patient notified and verbalized understanding.

## 2017-04-12 NOTE — Telephone Encounter (Signed)
It would be fine to transition over to [place] please have the pharmacist educate the patient to utilize the same dosing.  Also I assume this comes in a pen form-please make sure pharmacy also gives the patient any other necessary equipment that may be needed for this insulin.  Cancel the other insulin.  He may have 5 refills on the new insulin.  Please have the pharmacy tell the patient the following-the patient should report to [place] if any difficulties with his sugar readings

## 2017-04-12 NOTE — Telephone Encounter (Signed)
Per Estill Bamberg Pharmacist with [facility] is a generic form of Lantus. The pt was previously on Levemir 64 u QHS. She wants to know if you want the pt to take 64 units per night of this (Bagsalar).Please advise of dose and frequency as she will not take the instructions previously given Needs more detail. Thank you.

## 2017-04-12 NOTE — Telephone Encounter (Signed)
Bagsalar64 u qhs may tittrate up to 75 units, 1 month supply 5 refills

## 2017-05-05 ENCOUNTER — Other Ambulatory Visit: Payer: Self-pay | Admitting: Family Medicine

## 2017-06-06 ENCOUNTER — Encounter: Payer: Self-pay | Admitting: Family Medicine

## 2017-06-07 ENCOUNTER — Other Ambulatory Visit: Payer: Self-pay | Admitting: Family Medicine

## 2017-06-07 DIAGNOSIS — Z1159 Encounter for screening for other viral diseases: Secondary | ICD-10-CM

## 2017-06-07 DIAGNOSIS — Z125 Encounter for screening for malignant neoplasm of prostate: Secondary | ICD-10-CM

## 2017-06-07 DIAGNOSIS — E119 Type 2 diabetes mellitus without complications: Secondary | ICD-10-CM

## 2017-06-07 DIAGNOSIS — I10 Essential (primary) hypertension: Secondary | ICD-10-CM

## 2017-06-07 DIAGNOSIS — Z0000 Encounter for general adult medical examination without abnormal findings: Secondary | ICD-10-CM

## 2017-06-07 DIAGNOSIS — E7849 Other hyperlipidemia: Secondary | ICD-10-CM

## 2017-06-07 NOTE — Telephone Encounter (Signed)
Lipid, liver, metabolic 7, hemoglobin U3B, urine ACR, PSA, CBC, TSH, hepatitis C antibody, HIV antibody-hypertension hyperlipidemia diabetes screening

## 2017-06-07 NOTE — Telephone Encounter (Signed)
Labs placed; left message on patients voicemail

## 2017-06-10 DIAGNOSIS — Z0000 Encounter for general adult medical examination without abnormal findings: Secondary | ICD-10-CM | POA: Diagnosis not present

## 2017-06-10 DIAGNOSIS — E119 Type 2 diabetes mellitus without complications: Secondary | ICD-10-CM | POA: Diagnosis not present

## 2017-06-10 DIAGNOSIS — Z1159 Encounter for screening for other viral diseases: Secondary | ICD-10-CM | POA: Diagnosis not present

## 2017-06-10 DIAGNOSIS — I10 Essential (primary) hypertension: Secondary | ICD-10-CM | POA: Diagnosis not present

## 2017-06-12 LAB — PSA: Prostate Specific Ag, Serum: 0.3 ng/mL (ref 0.0–4.0)

## 2017-06-12 LAB — BASIC METABOLIC PANEL
BUN/Creatinine Ratio: 11 (ref 9–20)
BUN: 13 mg/dL (ref 6–24)
CO2: 23 mmol/L (ref 20–29)
CREATININE: 1.19 mg/dL (ref 0.76–1.27)
Calcium: 9.5 mg/dL (ref 8.7–10.2)
Chloride: 103 mmol/L (ref 96–106)
GFR calc Af Amer: 80 mL/min/{1.73_m2} (ref >59)
GFR calc non Af Amer: 69 mL/min/{1.73_m2} (ref >59)
GLUCOSE: 163 mg/dL — AB (ref 65–99)
Potassium: 4.9 mmol/L (ref 3.5–5.2)
SODIUM: 139 mmol/L (ref 134–144)

## 2017-06-12 LAB — HEPATITIS C ANTIBODY

## 2017-06-12 LAB — HEPATIC FUNCTION PANEL
ALBUMIN: 4.1 g/dL (ref 3.5–5.5)
ALK PHOS: 52 IU/L (ref 39–117)
ALT: 23 IU/L (ref 0–44)
AST: 20 IU/L (ref 0–40)
BILIRUBIN, DIRECT: 0.12 mg/dL (ref 0.00–0.40)
Bilirubin Total: 0.3 mg/dL (ref 0.0–1.2)
TOTAL PROTEIN: 6 g/dL (ref 6.0–8.5)

## 2017-06-12 LAB — CBC WITH DIFFERENTIAL/PLATELET
BASOS: 1 %
Basophils Absolute: 0.1 10*3/uL (ref 0.0–0.2)
EOS (ABSOLUTE): 0.2 10*3/uL (ref 0.0–0.4)
Eos: 3 %
Hematocrit: 41.7 % (ref 37.5–51.0)
Hemoglobin: 13.7 g/dL (ref 13.0–17.7)
Immature Grans (Abs): 0 10*3/uL (ref 0.0–0.1)
Immature Granulocytes: 0 %
Lymphocytes Absolute: 1.5 10*3/uL (ref 0.7–3.1)
Lymphs: 25 %
MCH: 31.7 pg (ref 26.6–33.0)
MCHC: 32.9 g/dL (ref 31.5–35.7)
MCV: 97 fL (ref 79–97)
MONOS ABS: 0.7 10*3/uL (ref 0.1–0.9)
Monocytes: 11 %
NEUTROS ABS: 3.6 10*3/uL (ref 1.4–7.0)
Neutrophils: 60 %
PLATELETS: 267 10*3/uL (ref 150–379)
RBC: 4.32 x10E6/uL (ref 4.14–5.80)
RDW: 13.8 % (ref 12.3–15.4)
WBC: 6 10*3/uL (ref 3.4–10.8)

## 2017-06-12 LAB — MICROALBUMIN / CREATININE URINE RATIO
Creatinine, Urine: 65.9 mg/dL
Microalb/Creat Ratio: 10.2 mg/g creat (ref 0.0–30.0)
Microalbumin, Urine: 6.7 ug/mL

## 2017-06-12 LAB — LIPID PANEL
CHOL/HDL RATIO: 2.5 ratio (ref 0.0–5.0)
Cholesterol, Total: 125 mg/dL (ref 100–199)
HDL: 50 mg/dL (ref >39)
LDL CALC: 59 mg/dL (ref 0–99)
TRIGLYCERIDES: 78 mg/dL (ref 0–149)
VLDL Cholesterol Cal: 16 mg/dL (ref 5–40)

## 2017-06-12 LAB — TSH: TSH: 1.36 u[IU]/mL (ref 0.450–4.500)

## 2017-06-12 LAB — HEMOGLOBIN A1C
Est. average glucose Bld gHb Est-mCnc: 235 mg/dL
Hgb A1c MFr Bld: 9.8 % — ABNORMAL HIGH (ref 4.8–5.6)

## 2017-06-12 LAB — HIV ANTIBODY (ROUTINE TESTING W REFLEX): HIV SCREEN 4TH GENERATION: NONREACTIVE

## 2017-06-13 ENCOUNTER — Encounter: Payer: Self-pay | Admitting: Family Medicine

## 2017-06-13 ENCOUNTER — Ambulatory Visit (INDEPENDENT_AMBULATORY_CARE_PROVIDER_SITE_OTHER): Payer: 59 | Admitting: Family Medicine

## 2017-06-13 VITALS — BP 130/78 | Ht 72.0 in | Wt 175.2 lb

## 2017-06-13 DIAGNOSIS — E119 Type 2 diabetes mellitus without complications: Secondary | ICD-10-CM | POA: Diagnosis not present

## 2017-06-13 DIAGNOSIS — F17200 Nicotine dependence, unspecified, uncomplicated: Secondary | ICD-10-CM

## 2017-06-13 DIAGNOSIS — E7849 Other hyperlipidemia: Secondary | ICD-10-CM

## 2017-06-13 MED ORDER — LOSARTAN POTASSIUM 50 MG PO TABS: 50.0000 mg | ORAL_TABLET | Freq: Every day | ORAL | 6 refills | Status: DC

## 2017-06-13 MED ORDER — SIMVASTATIN 40 MG PO TABS: 40.0000 mg | ORAL_TABLET | Freq: Every day | ORAL | 6 refills | Status: DC

## 2017-06-13 MED ORDER — BUPROPION HCL ER (SR) 150 MG PO TB12: 150.0000 mg | ORAL_TABLET | Freq: Two times a day (BID) | ORAL | 5 refills | Status: DC

## 2017-06-13 MED ORDER — INSULIN ASPART 100 UNIT/ML FLEXPEN: PEN_INJECTOR | SUBCUTANEOUS | 5 refills | Status: DC

## 2017-06-13 MED ORDER — METFORMIN HCL 500 MG PO TABS: 500.0000 mg | ORAL_TABLET | Freq: Two times a day (BID) | ORAL | 6 refills | Status: DC

## 2017-06-13 NOTE — Progress Notes (Signed)
Subjective:    Patient ID: Michael Fritz, male    DOB: [birth year], 52 y.o.   MRN: [id number]  Diabetes  He presents for his follow-up diabetic visit. He has type 2 diabetes mellitus. Pertinent negatives for hypoglycemia include no confusion or headaches. Pertinent negatives for diabetes include no chest pain, no fatigue, no polydipsia, no polyphagia and no weakness. Risk factors for coronary artery disease include diabetes mellitus, hypertension and dyslipidemia. Current diabetic treatment includes insulin injections and oral agent (monotherapy). He is compliant with treatment all of the time. His weight is stable. He is following a diabetic diet. He has not had a previous visit with a dietitian. He does not see a podiatrist.Eye exam is not current.   Results for orders placed or performed in visit on 06/07/17  HIV antibody (with reflex)  Result Value Ref Range   HIV Screen 4th Generation wRfx Non Reactive Non Reactive  Hepatitis C Antibody  Result Value Ref Range   Hep C Virus Ab <0.1 0.0 - 0.9 s/co ratio  TSH  Result Value Ref Range   TSH 1.360 0.450 - 4.500 uIU/mL  CBC with Differential  Result Value Ref Range   WBC 6.0 3.4 - 10.8 x10E3/uL   RBC 4.32 4.14 - 5.80 x10E6/uL   Hemoglobin 13.7 13.0 - 17.7 g/dL   Hematocrit 41.7 37.5 - 51.0 %   MCV 97 79 - 97 fL   MCH 31.7 26.6 - 33.0 pg   MCHC 32.9 31.5 - 35.7 g/dL   RDW 13.8 12.3 - 15.4 %   Platelets 267 150 - 379 x10E3/uL   Neutrophils 60 Not Estab. %   Lymphs 25 Not Estab. %   Monocytes 11 Not Estab. %   Eos 3 Not Estab. %   Basos 1 Not Estab. %   Neutrophils Absolute 3.6 1.4 - 7.0 x10E3/uL   Lymphocytes Absolute 1.5 0.7 - 3.1 x10E3/uL   Monocytes Absolute 0.7 0.1 - 0.9 x10E3/uL   EOS (ABSOLUTE) 0.2 0.0 - 0.4 x10E3/uL   Basophils Absolute 0.1 0.0 - 0.2 x10E3/uL   Immature Granulocytes 0 Not Estab. %   Immature Grans (Abs) 0.0 0.0 - 0.1 x10E3/uL  PSA  Result Value Ref Range   Prostate Specific Ag, Serum 0.3 0.0 - 4.0  ng/mL  Urine Microalbumin w/creat. ratio  Result Value Ref Range   Creatinine, Urine 65.9 Not Estab. mg/dL   Microalbumin, Urine 6.7 Not Estab. ug/mL   Microalb/Creat Ratio 10.2 0.0 - 30.0 mg/g creat  HgB A1c  Result Value Ref Range   Hgb A1c MFr Bld 9.8 (H) 4.8 - 5.6 %   Est. average glucose Bld gHb Est-mCnc 235 mg/dL  Basic Metabolic Panel (BMET)  Result Value Ref Range   Glucose 163 (H) 65 - 99 mg/dL   BUN 13 6 - 24 mg/dL   Creatinine, Ser 1.19 0.76 - 1.27 mg/dL   GFR calc non Af Amer 69 >59 mL/min/1.73   GFR calc Af Amer 80 >59 mL/min/1.73   BUN/Creatinine Ratio 11 9 - 20   Sodium 139 134 - 144 mmol/L   Potassium 4.9 3.5 - 5.2 mmol/L   Chloride 103 96 - 106 mmol/L   CO2 23 20 - 29 mmol/L   Calcium 9.5 8.7 - 10.2 mg/dL  Hepatic function panel  Result Value Ref Range   Total Protein 6.0 6.0 - 8.5 g/dL   Albumin 4.1 3.5 - 5.5 g/dL   Bilirubin Total 0.3 0.0 - 1.2 mg/dL   Bilirubin, Direct  0.12 0.00 - 0.40 mg/dL   Alkaline Phosphatase 52 39 - 117 IU/L   AST 20 0 - 40 IU/L   ALT 23 0 - 44 IU/L  Lipid Profile  Result Value Ref Range   Cholesterol, Total 125 100 - 199 mg/dL   Triglycerides 78 0 - 149 mg/dL   HDL 50 >39 mg/dL   VLDL Cholesterol Cal 16 5 - 40 mg/dL   LDL Calculated 59 0 - 99 mg/dL   Chol/HDL Ratio 2.5 0.0 - 5.0 ratio   Patient here for follow-up regarding cholesterol.  Patient does try to maintain a reasonable diet.  Patient does take the medication on a regular basis.  Denies missing a dose.  The patient denies any obvious side effects.  Prior blood work results reviewed with the patient.  The patient is aware of his cholesterol goals and the need to keep it under good control to lessen the risk of disease.  Poorly controlled diabetes A1c above where we want to be Patient states when sugar levels are down in the 100 range he does not tolerated He states he feels best when his sugars are running mildly elevated.  It should be noted that his A1c is significantly  improved compared to where it was Patient does not want to start on any additional medications currently he would like to work hard on diet he does agree to trying low-dose regular insulin 1 meal per day in addition to his long-acting insulin  We discussed at length about the importance of quitting smoking unfortunately it is unlikely he will quit but he is willing to try it again Patient does use tobacco products.  Patient knows they should quit.  Patient is aware that smoking/in use of tobacco products increases their risk of heart disease, cancer, and COPD- lung issues.  Patient has been counseled to quit smoking/tobacco products.  Mild erectile dysfunction uses sildenafil denies any problems with it  Patient under a lot of stress at work but denies being depressed states Wellbutrin does help relates compliance with taking medicine no side effects not suicidal    Review of Systems  Constitutional: Negative for activity change, appetite change and fatigue.  HENT: Negative for congestion and rhinorrhea.   Respiratory: Negative for cough, chest tightness and shortness of breath.   Cardiovascular: Negative for chest pain and leg swelling.  Gastrointestinal: Negative for abdominal pain, diarrhea and nausea.  Endocrine: Negative for polydipsia and polyphagia.  Genitourinary: Negative for dysuria and hematuria.  Neurological: Negative for weakness and headaches.  Psychiatric/Behavioral: Negative for confusion and dysphoric mood.       Objective:   Physical Exam  Constitutional: He appears well-nourished. No distress.  HENT:  Head: Normocephalic and atraumatic.  Eyes: Right eye exhibits no discharge. Left eye exhibits no discharge.  Neck: No tracheal deviation present.  Cardiovascular: Normal rate, regular rhythm and normal heart sounds.  No murmur heard. Pulmonary/Chest: Effort normal and breath sounds normal. No respiratory distress. He has no wheezes.  Musculoskeletal: He exhibits no  edema.  Lymphadenopathy:    He has no cervical adenopathy.  Neurological: He is alert.  Skin: Skin is warm. No rash noted.  Psychiatric: His behavior is normal.  Vitals reviewed.  Diabetic foot exam normal  25 minutes was spent with the patient.  This statement verifies that 25 minutes was indeed spent with the patient. Greater than half the time was spent in discussion, counseling and answering questions  regarding the issues that the patient came in  for today as reflected in the diagnosis (s) please refer to documentation for further details. Part of this was education regarding diet as well as education regarding insulin shots along with target zones to try to reach     Assessment & Plan:  Subpar controlled diabetes he is at risk of complications he understands this he will initiate short acting insulin 1 meal per day otherwise he will do the long-acting in the evening he will send [place] readings periodically  Patient will follow-up after some summer vacations he will follow-up in September lab work before this  Patient is a smoker he has been counseled to quit smoking cut back  Hyperlipidemia under decent control continue current medications watch diet  Patient should do colonoscopy sometime this year

## 2017-06-14 ENCOUNTER — Telehealth: Payer: Self-pay | Admitting: *Deleted

## 2017-06-14 NOTE — Telephone Encounter (Signed)
Called [employer] and they state only short acting covered was humalog vial or [employer]

## 2017-06-14 NOTE — Telephone Encounter (Signed)
I am more interested in what does [employer] cover for short acting insulin for their patients?  Any choices?

## 2017-06-14 NOTE — Telephone Encounter (Signed)
Fax from [employer] [facility]. [employer] [facility] cannot perform prior auth on novolog inj flexpen because there is no coverage criteria to review and apply. If the treating prhysician would like to discuss this coverage decision with the physician reviewer please call [phone].

## 2017-06-18 NOTE — Telephone Encounter (Signed)
Kwikpen 2 to 4 units with largest meal of the day, 1 month supply, 6 refills

## 2017-06-19 ENCOUNTER — Other Ambulatory Visit: Payer: Self-pay | Admitting: *Deleted

## 2017-06-19 NOTE — Telephone Encounter (Signed)
Left message to return call to let pt know of change to and clarify [facility]. Form was from [employer] but [employer] is not in pt's chart. [facility].

## 2017-06-20 ENCOUNTER — Other Ambulatory Visit: Payer: Self-pay | Admitting: *Deleted

## 2017-06-20 MED ORDER — INSULIN LISPRO 100 UNIT/ML (KWIKPEN): PEN_INJECTOR | SUBCUTANEOUS | 5 refills | Status: DC

## 2017-06-20 NOTE — Telephone Encounter (Signed)
Discussed with pt. Pt verbalized understanding. Pt wants med sent to [facility]. Med sent to pharm.

## 2017-06-20 NOTE — Telephone Encounter (Signed)
Let message to return call 

## 2017-11-21 ENCOUNTER — Ambulatory Visit: Payer: 59 | Admitting: Family Medicine

## 2017-11-27 ENCOUNTER — Encounter: Payer: Self-pay | Admitting: Family Medicine

## 2017-11-27 ENCOUNTER — Ambulatory Visit: Payer: 59 | Admitting: Family Medicine

## 2017-11-27 VITALS — BP 142/86 | Ht 72.0 in | Wt 166.0 lb

## 2017-11-27 DIAGNOSIS — E119 Type 2 diabetes mellitus without complications: Secondary | ICD-10-CM | POA: Diagnosis not present

## 2017-11-27 DIAGNOSIS — Z79899 Other long term (current) drug therapy: Secondary | ICD-10-CM

## 2017-11-27 DIAGNOSIS — E7849 Other hyperlipidemia: Secondary | ICD-10-CM | POA: Diagnosis not present

## 2017-11-27 DIAGNOSIS — I10 Essential (primary) hypertension: Secondary | ICD-10-CM | POA: Diagnosis not present

## 2017-11-27 DIAGNOSIS — Z23 Encounter for immunization: Secondary | ICD-10-CM | POA: Diagnosis not present

## 2017-11-27 DIAGNOSIS — Z1211 Encounter for screening for malignant neoplasm of colon: Secondary | ICD-10-CM

## 2017-11-27 LAB — POCT GLYCOSYLATED HEMOGLOBIN (HGB A1C): Hemoglobin A1C: 8 % — AB (ref 4.0–5.6)

## 2017-11-27 MED ORDER — LOSARTAN POTASSIUM 100 MG PO TABS: 100.0000 mg | ORAL_TABLET | Freq: Every day | ORAL | 6 refills | Status: DC

## 2017-11-27 MED ORDER — SIMVASTATIN 40 MG PO TABS: 40.0000 mg | ORAL_TABLET | Freq: Every day | ORAL | 6 refills | Status: DC

## 2017-11-27 MED ORDER — METFORMIN HCL 500 MG PO TABS: 500.0000 mg | ORAL_TABLET | Freq: Two times a day (BID) | ORAL | 6 refills | Status: DC

## 2017-11-27 NOTE — Progress Notes (Signed)
   Subjective:    Patient ID: Michael Fritz, male    DOB: 01/04/1966, 52 y.o.   MRN: [id number]  Diabetes  He presents for his follow-up diabetic visit. He has type 2 diabetes mellitus. Pertinent negatives for hypoglycemia include no confusion, dizziness or headaches. Pertinent negatives for diabetes include no chest pain and no fatigue. Risk factors for coronary artery disease include dyslipidemia and hypertension. Current diabetic treatment includes insulin injections and oral agent (monotherapy). He is compliant with treatment all of the time. His weight is stable. He is following a diabetic diet. He has not had a previous visit with a dietitian. He does not see a podiatrist.Eye exam is not current.   Patient for blood pressure check up.  The patient does have hypertension.  The patient is on medication.  Patient relates compliance with meds. Todays BP reviewed with the patient. Patient denies issues with medication. Patient relates reasonable diet. Patient tries to minimize salt. Patient aware of BP goals.  Patient here for follow-up regarding cholesterol.  The patient does have hyperlipidemia.  Patient does try to maintain a reasonable diet.  Patient does take the medication on a regular basis.  Denies missing a dose.  The patient denies any obvious side effects.  Prior blood work results reviewed with the patient.  The patient is aware of his cholesterol goals and the need to keep it under good control to lessen the risk of disease.    Review of Systems  Constitutional: Negative for diaphoresis and fatigue.  HENT: Negative for congestion and rhinorrhea.   Respiratory: Negative for cough and shortness of breath.   Cardiovascular: Negative for chest pain and leg swelling.  Gastrointestinal: Negative for abdominal pain and diarrhea.  Skin: Negative for color change and rash.  Neurological: Negative for dizziness and headaches.  Psychiatric/Behavioral: Negative for behavioral problems and  confusion.       Objective:   Physical Exam  Constitutional: He appears well-nourished. No distress.  HENT:  Head: Normocephalic and atraumatic.  Eyes: Right eye exhibits no discharge. Left eye exhibits no discharge.  Neck: No tracheal deviation present.  Cardiovascular: Normal rate, regular rhythm and normal heart sounds.  No murmur heard. Pulmonary/Chest: Effort normal and breath sounds normal. No respiratory distress.  Musculoskeletal: He exhibits no edema.  Lymphadenopathy:    He has no cervical adenopathy.  Neurological: He is alert. Coordination normal.  Skin: Skin is warm and dry.  Psychiatric: He has a normal mood and affect. His behavior is normal.  Vitals reviewed.         Assessment & Plan:  The patient was seen today as part of a comprehensive visit for diabetes. The importance of keeping her A1c at or below 7 was discussed.  Importance of regular physical activity was discussed.   The importance of adherence to medication as well as a controlled low starch/sugar diet was also discussed.  Standard follow-up visit recommended.  Also patient aware failure to keep diabetes under control increases the risk of complications. A1c not at goal but patient can work harder on diet has had some low sugar spells at this point time does not want to be on additional dosing of medication patient to follow-up in 6 months he will send [place] readings intermittently  Hyperlipidemia good control with medication previous labs looked at more labs before next visit  HTN good control overall watch diet continue medication

## 2017-11-27 NOTE — Addendum Note (Signed)
Addended by: Karle Barr on: 11/27/2017 12:59 PM   Modules accepted: Orders

## 2017-11-27 NOTE — Progress Notes (Signed)
Referral placed in Epic. I called and left a message to r/c.

## 2017-11-28 ENCOUNTER — Encounter: Payer: Self-pay | Admitting: Family Medicine

## 2017-11-28 ENCOUNTER — Encounter (INDEPENDENT_AMBULATORY_CARE_PROVIDER_SITE_OTHER): Payer: Self-pay | Admitting: *Deleted

## 2017-11-29 NOTE — Progress Notes (Signed)
Left message to return call 

## 2018-01-08 ENCOUNTER — Telehealth: Payer: Self-pay | Admitting: Family Medicine

## 2018-01-08 NOTE — Telephone Encounter (Signed)
Script written and awaiting signature. Sensors need to switched out ever 14 days; put #24 to be dispensed for 12 months.

## 2018-01-08 NOTE — Telephone Encounter (Signed)
Prescription signed and faxed to [facility] on [address]. Left message for patient to return call.

## 2018-01-08 NOTE — Telephone Encounter (Signed)
Please go ahead and give a prescription that the patient may have refills for 12 months plus also the patient is a insulin dependent diabetic and has to check his sugars at least 4 times daily  If the pharmacy needs something else to be documented please let me know

## 2018-01-08 NOTE — Telephone Encounter (Signed)
Pharmacy fax stating that patient brought a prescription for just the freestyle libre reader with no accompanying prescription for the sensors with quanitity/refills. Please advise.

## 2018-01-15 NOTE — Telephone Encounter (Signed)
I called and left a message to r/c. 

## 2018-01-18 NOTE — Telephone Encounter (Signed)
Pharmacy stated patient picked up the meter and sensor last week.

## 2018-03-20 ENCOUNTER — Encounter: Payer: Self-pay | Admitting: Family Medicine

## 2018-03-20 DIAGNOSIS — N529 Male erectile dysfunction, unspecified: Secondary | ICD-10-CM

## 2018-03-21 NOTE — Telephone Encounter (Signed)
Please help set up the patient with [facility] erectile dysfunction  Please send the patient notification that you have started the referral based upon my recommendation

## 2018-03-21 NOTE — Addendum Note (Signed)
Addended by: Carmelina Noun on: 03/21/2018 03:16 PM   Modules accepted: Orders

## 2018-03-24 ENCOUNTER — Other Ambulatory Visit: Payer: Self-pay | Admitting: Family Medicine

## 2018-03-30 ENCOUNTER — Encounter: Payer: Self-pay | Admitting: Family Medicine

## 2018-04-10 ENCOUNTER — Encounter: Payer: Self-pay | Admitting: Family Medicine

## 2018-04-10 NOTE — Telephone Encounter (Signed)
Please send in refills on this may have as needed refills enough for a whole year

## 2018-04-12 ENCOUNTER — Other Ambulatory Visit: Payer: Self-pay

## 2018-04-12 MED ORDER — BASAGLAR KWIKPEN 100 UNIT/ML ~~LOC~~ SOPN: PEN_INJECTOR | SUBCUTANEOUS | 1 refills | Status: DC

## 2018-05-05 ENCOUNTER — Other Ambulatory Visit: Payer: Self-pay | Admitting: Family Medicine

## 2018-05-07 NOTE — Telephone Encounter (Signed)
This +2 refills needs office visit

## 2018-06-11 DIAGNOSIS — E7849 Other hyperlipidemia: Secondary | ICD-10-CM | POA: Diagnosis not present

## 2018-06-11 DIAGNOSIS — E119 Type 2 diabetes mellitus without complications: Secondary | ICD-10-CM | POA: Diagnosis not present

## 2018-06-11 DIAGNOSIS — Z79899 Other long term (current) drug therapy: Secondary | ICD-10-CM | POA: Diagnosis not present

## 2018-06-12 LAB — HEPATIC FUNCTION PANEL
ALT: 22 IU/L (ref 0–44)
AST: 18 IU/L (ref 0–40)
Albumin: 4.4 g/dL (ref 3.8–4.9)
Alkaline Phosphatase: 63 IU/L (ref 39–117)
BILIRUBIN, DIRECT: 0.1 mg/dL (ref 0.00–0.40)
Bilirubin Total: 0.4 mg/dL (ref 0.0–1.2)
TOTAL PROTEIN: 6.9 g/dL (ref 6.0–8.5)

## 2018-06-12 LAB — BASIC METABOLIC PANEL
BUN / CREAT RATIO: 12 (ref 9–20)
BUN: 15 mg/dL (ref 6–24)
CALCIUM: 10.4 mg/dL — AB (ref 8.7–10.2)
CO2: 25 mmol/L (ref 20–29)
CREATININE: 1.22 mg/dL (ref 0.76–1.27)
Chloride: 100 mmol/L (ref 96–106)
GFR calc Af Amer: 77 mL/min/{1.73_m2} (ref >59)
GFR calc non Af Amer: 67 mL/min/{1.73_m2} (ref >59)
GLUCOSE: 63 mg/dL — AB (ref 65–99)
Potassium: 5.3 mmol/L — ABNORMAL HIGH (ref 3.5–5.2)
Sodium: 141 mmol/L (ref 134–144)

## 2018-06-12 LAB — LIPID PANEL
CHOL/HDL RATIO: 3.5 ratio (ref 0.0–5.0)
Cholesterol, Total: 202 mg/dL — ABNORMAL HIGH (ref 100–199)
HDL: 57 mg/dL (ref >39)
LDL CALC: 116 mg/dL — AB (ref 0–99)
Triglycerides: 146 mg/dL (ref 0–149)
VLDL Cholesterol Cal: 29 mg/dL (ref 5–40)

## 2018-06-15 ENCOUNTER — Encounter: Payer: Self-pay | Admitting: Family Medicine

## 2018-06-15 NOTE — Telephone Encounter (Signed)
Virtual visit was scheduled with Dr Nicki Reaper next week

## 2018-06-19 ENCOUNTER — Encounter: Payer: Self-pay | Admitting: Family Medicine

## 2018-06-19 ENCOUNTER — Ambulatory Visit (INDEPENDENT_AMBULATORY_CARE_PROVIDER_SITE_OTHER): Payer: 59 | Admitting: Family Medicine

## 2018-06-19 ENCOUNTER — Other Ambulatory Visit: Payer: Self-pay | Admitting: Family Medicine

## 2018-06-19 ENCOUNTER — Other Ambulatory Visit: Payer: Self-pay

## 2018-06-19 VITALS — Wt 170.0 lb

## 2018-06-19 DIAGNOSIS — E119 Type 2 diabetes mellitus without complications: Secondary | ICD-10-CM

## 2018-06-19 DIAGNOSIS — E7849 Other hyperlipidemia: Secondary | ICD-10-CM | POA: Diagnosis not present

## 2018-06-19 DIAGNOSIS — Z79899 Other long term (current) drug therapy: Secondary | ICD-10-CM

## 2018-06-19 DIAGNOSIS — I10 Essential (primary) hypertension: Secondary | ICD-10-CM

## 2018-06-19 MED ORDER — ATORVASTATIN CALCIUM 40 MG PO TABS: 40.0000 mg | ORAL_TABLET | Freq: Every day | ORAL | 1 refills | Status: DC

## 2018-06-19 NOTE — Progress Notes (Signed)
   Subjective:    Patient ID: Michael Fritz, male    DOB: [birth year], 53 y.o.   MRN: [id number] Video visit Diabetes Coronavirus outbreak  HPI Pt here for med check and to discuss labs. Pt states everything has been going ok. Pt states no other concerns at this time.  Patient states he has been doing well taking his medications on a regular basis denies any major setbacks.  States his sugars have been all over the place but he feels like it is doing a little bit better than what was He is currently on metformin and insulin He does try to follow diet as best he can Not having to work on a regular basis He still smokes he knows he needs to quit smoking.  Denies any major setbacks  Virtual Visit via Video Note  I connected with Michael Fritz on 06/19/18 at 11:00 AM EDT by a video enabled telemedicine application and verified that I am speaking with the correct person using two identifiers.   I discussed the limitations of evaluation and management by telemedicine and the availability of in person appointments. The patient expressed understanding and agreed to proceed.  History of Present Illness:    Observations/Objective:   Assessment and Plan:   Follow Up Instructions:    I discussed the assessment and treatment plan with the patient. The patient was provided an opportunity to ask questions and all were answered. The patient agreed with the plan and demonstrated an understanding of the instructions.   The patient was advised to call back or seek an in-person evaluation if the symptoms worsen or if the condition fails to improve as anticipated.  I provided 15 minutes of non-face-to-face time during this encounter.   Vicente Males, LPN    Review of Systems     Objective:   Physical Exam        Assessment & Plan:  This patient will come by to check an A1c here follow-up later this week we will do this as an outpatient  Hyperlipidemia subpar control work  toward getting it under better control change medications to Lipitor 40 mg daily previous labs reviewed new labs will be ordered in approximately 5 to 6 months  Diabetes fair control continue current measures.  Follow-up if progressive troubles or worse  Hyperlipidemia we will change over to Lipitor 40 mg daily to get things under better control lab work by follow-up in late summer early fall

## 2018-06-19 NOTE — Progress Notes (Signed)
Labs mailed to patient with instructions to have labs done in September or early October

## 2018-06-20 ENCOUNTER — Ambulatory Visit: Payer: 59 | Admitting: Family Medicine

## 2018-06-25 NOTE — Progress Notes (Signed)
Pt did not come by and have A1C done on 06/20/2018. Please advise. Thank you

## 2018-07-02 ENCOUNTER — Telehealth: Payer: Self-pay | Admitting: Family Medicine

## 2018-07-02 NOTE — Telephone Encounter (Signed)
Please advise. Thank you

## 2018-07-02 NOTE — Telephone Encounter (Signed)
He can do the A1c as a nurse visit either Tuesday Wednesday or Thursday in the afternoon before he goes to work

## 2018-07-02 NOTE — Telephone Encounter (Signed)
Patient states forgot appointment last with and wanting  To get his A1C done. He is on second shift this week,he has to be at work at 1 pm. Please advise

## 2018-07-03 NOTE — Telephone Encounter (Signed)
LVM to return call and schedule A1C

## 2018-07-19 DIAGNOSIS — N486 Induration penis plastica: Secondary | ICD-10-CM | POA: Diagnosis not present

## 2018-07-27 ENCOUNTER — Other Ambulatory Visit: Payer: Self-pay | Admitting: Family Medicine

## 2018-08-06 ENCOUNTER — Other Ambulatory Visit: Payer: Self-pay | Admitting: Family Medicine

## 2018-09-02 ENCOUNTER — Other Ambulatory Visit: Payer: Self-pay | Admitting: Family Medicine

## 2018-09-03 ENCOUNTER — Other Ambulatory Visit: Payer: Self-pay | Admitting: Family Medicine

## 2018-09-04 NOTE — Telephone Encounter (Signed)
This +3 refills

## 2018-09-04 NOTE — Telephone Encounter (Signed)
This +3 refills of each

## 2018-10-13 ENCOUNTER — Other Ambulatory Visit: Payer: Self-pay | Admitting: Family Medicine

## 2018-10-15 NOTE — Telephone Encounter (Signed)
Pt is completely out of metformin. Wife would like to know if we can send to [facility] in [place] so she can pick up when she gets off work.

## 2018-10-15 NOTE — Telephone Encounter (Signed)
Pt's wife calling back and would like to just keep it at [facility].

## 2018-11-25 ENCOUNTER — Other Ambulatory Visit: Payer: Self-pay | Admitting: Family Medicine

## 2018-12-30 ENCOUNTER — Other Ambulatory Visit: Payer: Self-pay | Admitting: Family Medicine

## 2019-01-21 ENCOUNTER — Other Ambulatory Visit: Payer: Self-pay | Admitting: Family Medicine

## 2019-01-21 NOTE — Telephone Encounter (Signed)
Please send patient my chart message needs to do follow-up visit within 30 days

## 2019-01-22 NOTE — Telephone Encounter (Signed)
MyChart message sent to patient.

## 2019-03-25 ENCOUNTER — Other Ambulatory Visit: Payer: Self-pay | Admitting: Family Medicine

## 2019-03-25 NOTE — Telephone Encounter (Signed)
Please contact patient to set up appt; then may route back to nurses. Thank you

## 2019-03-25 NOTE — Telephone Encounter (Signed)
Let message to schedule follow up

## 2019-03-27 NOTE — Telephone Encounter (Signed)
lvm to schedule virtual   

## 2019-04-05 NOTE — Telephone Encounter (Signed)
Patient made appointment for 2/8 for his medication follow up. Please send in refill today completely out of medication per  patient

## 2019-04-15 ENCOUNTER — Ambulatory Visit (INDEPENDENT_AMBULATORY_CARE_PROVIDER_SITE_OTHER): Payer: 59 | Admitting: Family Medicine

## 2019-04-15 ENCOUNTER — Other Ambulatory Visit: Payer: Self-pay

## 2019-04-15 ENCOUNTER — Encounter: Payer: Self-pay | Admitting: Family Medicine

## 2019-04-15 DIAGNOSIS — Z125 Encounter for screening for malignant neoplasm of prostate: Secondary | ICD-10-CM

## 2019-04-15 DIAGNOSIS — E119 Type 2 diabetes mellitus without complications: Secondary | ICD-10-CM | POA: Diagnosis not present

## 2019-04-15 DIAGNOSIS — F17200 Nicotine dependence, unspecified, uncomplicated: Secondary | ICD-10-CM

## 2019-04-15 DIAGNOSIS — Z1211 Encounter for screening for malignant neoplasm of colon: Secondary | ICD-10-CM

## 2019-04-15 DIAGNOSIS — I10 Essential (primary) hypertension: Secondary | ICD-10-CM | POA: Diagnosis not present

## 2019-04-15 DIAGNOSIS — E7849 Other hyperlipidemia: Secondary | ICD-10-CM | POA: Diagnosis not present

## 2019-04-15 DIAGNOSIS — N486 Induration penis plastica: Secondary | ICD-10-CM

## 2019-04-15 HISTORY — DX: Induration penis plastica: N48.6

## 2019-04-15 MED ORDER — METFORMIN HCL 500 MG PO TABS: ORAL_TABLET | ORAL | 5 refills | Status: DC

## 2019-04-15 MED ORDER — LOSARTAN POTASSIUM 100 MG PO TABS: 100.0000 mg | ORAL_TABLET | Freq: Every day | ORAL | 5 refills | Status: DC

## 2019-04-15 MED ORDER — LANTUS SOLOSTAR 100 UNIT/ML ~~LOC~~ SOPN: PEN_INJECTOR | SUBCUTANEOUS | 5 refills | Status: DC

## 2019-04-15 MED ORDER — ATORVASTATIN CALCIUM 40 MG PO TABS: 40.0000 mg | ORAL_TABLET | Freq: Every day | ORAL | 1 refills | Status: DC

## 2019-04-15 NOTE — Progress Notes (Signed)
Subjective:    Patient ID: Michael Fritz, male    DOB: [birth year], 54 y.o.   MRN: [id number]  Diabetes He presents for his follow-up diabetic visit. He has type 2 diabetes mellitus. His disease course has been improving. Pertinent negatives for hypoglycemia include no confusion, dizziness or headaches. Pertinent negatives for diabetes include no chest pain and no fatigue. He is compliant with treatment all of the time (metformin, lantus. takes every day but works swing shifts so has to switch the times he takes meds). He rarely (only work, plays golf here and there) participates in exercise. Home blood sugar record trend: sugar readings for last 30 days are over 200. Eye exam is not current.   Pt had atorvastatin and simvastatin on med list. He states he is taking simvastatin. There was a note on med list to stop simvastatin when atorvastatin was sent over in April. Then simvastatin was refilled in June. Pt wants to clarify which one he is suppose to take.   Virtual Visit via Telephone Note  I connected with Michael Fritz on 04/15/19 at  9:00 AM EST by telephone and verified that I am speaking with the correct person using two identifiers.  Location: Patient: home Provider: office   I discussed the limitations, risks, security and privacy concerns of performing an evaluation and management service by telephone and the availability of in person appointments. I also discussed with the patient that there may be a patient responsible charge related to this service. The patient expressed understanding and agreed to proceed.   History of Present Illness:    Observations/Objective:   Assessment and Plan:   Follow Up Instructions:    I discussed the assessment and treatment plan with the patient. The patient was provided an opportunity to ask questions and all were answered. The patient agreed with the plan and demonstrated an understanding of the instructions.   The patient was  advised to call back or seek an in-person evaluation if the symptoms worsen or if the condition fails to improve as anticipated.  I provided 17 minutes of non-face-to-face time during this encounter.       Virtual Visit via Telephone Note  I connected with Michael Fritz on 04/15/19 at  9:00 AM EST by telephone and verified that I am speaking with the correct person using two identifiers.  Location: Patient: home Provider: office   I discussed the limitations, risks, security and privacy concerns of performing an evaluation and management service by telephone and the availability of in person appointments. I also discussed with the patient that there may be a patient responsible charge related to this service. The patient expressed understanding and agreed to proceed.   History of Present Illness:    Observations/Objective:   Assessment and Plan:   Follow Up Instructions:    I discussed the assessment and treatment plan with the patient. The patient was provided an opportunity to ask questions and all were answered. The patient agreed with the plan and demonstrated an understanding of the instructions.   The patient was advised to call back or seek an in-person evaluation if the symptoms worsen or if the condition fails to improve as anticipated.  I provided 18 minutes of non-face-to-face time during this encounter.     Review of Systems  Constitutional: Negative for diaphoresis and fatigue.  HENT: Negative for congestion and rhinorrhea.   Respiratory: Negative for cough and shortness of breath.   Cardiovascular: Negative for chest pain and  leg swelling.  Gastrointestinal: Negative for abdominal pain and diarrhea.  Skin: Negative for color change and rash.  Neurological: Negative for dizziness and headaches.  Psychiatric/Behavioral: Negative for behavioral problems and confusion.       Objective:   Physical Exam   Today's visit was via telephone Physical  exam was not possible for this visit      Assessment & Plan:  1. Other hyperlipidemia Extremely important for patient doing good job of healthy eating regular activity and take a atorvastatin daily - Lipid panel - Hepatic function panel  2. Type 2 diabetes mellitus without complication, unspecified whether long term insulin use ([place]) He states his sugars have been doing well he will do lab work in the near future.  He does not do short acting insulin he states it causes him to have hypoglycemia he only does long-acting currently doing 55 units of Lantus daily - Hepatic function panel - Microalbumin / creatinine urine ratio - Hemoglobin A1c  3. Essential hypertension He relates blood pressure doing okay takes his medicine watches diet - Hepatic function panel - Basic metabolic panel  4. Smoker He does smoke he knows he needs to quit I encouraged him strongly to quit - Hepatic function panel  5. Peyronie's disease Patient being followed by urology for this - Hepatic function panel  6. Screening for prostate cancer Screening tests ordered - PSA

## 2019-04-16 ENCOUNTER — Encounter: Payer: Self-pay | Admitting: Family Medicine

## 2019-04-16 NOTE — Addendum Note (Signed)
Addended by: Vicente Males on: 04/16/2019 11:53 AM   Modules accepted: Orders

## 2019-04-16 NOTE — Progress Notes (Signed)
Referral to Dr.Rehman placed in Epic

## 2019-04-17 ENCOUNTER — Encounter (INDEPENDENT_AMBULATORY_CARE_PROVIDER_SITE_OTHER): Payer: Self-pay | Admitting: *Deleted

## 2019-12-09 ENCOUNTER — Telehealth: Payer: Self-pay

## 2019-12-09 DIAGNOSIS — I10 Essential (primary) hypertension: Secondary | ICD-10-CM

## 2019-12-09 DIAGNOSIS — Z79899 Other long term (current) drug therapy: Secondary | ICD-10-CM

## 2019-12-09 DIAGNOSIS — Z125 Encounter for screening for malignant neoplasm of prostate: Secondary | ICD-10-CM

## 2019-12-09 DIAGNOSIS — E7849 Other hyperlipidemia: Secondary | ICD-10-CM

## 2019-12-09 DIAGNOSIS — E119 Type 2 diabetes mellitus without complications: Secondary | ICD-10-CM

## 2019-12-09 NOTE — Telephone Encounter (Signed)
Lipid, liver, metabolic 7, urine ACR, U4R, PSA Diabetes hyperlipidemia hypertension screening

## 2019-12-09 NOTE — Telephone Encounter (Signed)
Pt has follow up on 11/8 and would like lab work done before appt.

## 2019-12-09 NOTE — Telephone Encounter (Signed)
Last labs 06/2018: Lipid, liver, Met 7 HgbA1c, Urine Micro protien, PSA

## 2019-12-10 NOTE — Telephone Encounter (Signed)
Lab orders placed. Left message to return call  

## 2019-12-19 LAB — BASIC METABOLIC PANEL
BUN/Creatinine Ratio: 9 (ref 9–20)
BUN: 11 mg/dL (ref 6–24)
CO2: 24 mmol/L (ref 20–29)
Calcium: 9.6 mg/dL (ref 8.7–10.2)
Chloride: 102 mmol/L (ref 96–106)
Creatinine, Ser: 1.17 mg/dL (ref 0.76–1.27)
GFR calc Af Amer: 81 mL/min/{1.73_m2} (ref >59)
GFR calc non Af Amer: 70 mL/min/{1.73_m2} (ref >59)
Glucose: 137 mg/dL — ABNORMAL HIGH (ref 65–99)
Potassium: 5 mmol/L (ref 3.5–5.2)
Sodium: 139 mmol/L (ref 134–144)

## 2019-12-19 LAB — LIPID PANEL
Chol/HDL Ratio: 2.2 ratio (ref 0.0–5.0)
Cholesterol, Total: 154 mg/dL (ref 100–199)
HDL: 70 mg/dL (ref >39)
LDL Chol Calc (NIH): 74 mg/dL (ref 0–99)
Triglycerides: 45 mg/dL (ref 0–149)
VLDL Cholesterol Cal: 10 mg/dL (ref 5–40)

## 2019-12-19 LAB — HEMOGLOBIN A1C
Est. average glucose Bld gHb Est-mCnc: 255 mg/dL
Hgb A1c MFr Bld: 10.5 % — ABNORMAL HIGH (ref 4.8–5.6)

## 2019-12-19 LAB — MICROALBUMIN / CREATININE URINE RATIO
Creatinine, Urine: 136.2 mg/dL
Microalb/Creat Ratio: 367 mg/g creat — ABNORMAL HIGH (ref 0–29)
Microalbumin, Urine: 500.5 ug/mL

## 2019-12-19 LAB — HEPATIC FUNCTION PANEL
ALT: 15 IU/L (ref 0–44)
AST: 17 IU/L (ref 0–40)
Albumin: 4.3 g/dL (ref 3.8–4.9)
Alkaline Phosphatase: 60 IU/L (ref 44–121)
Bilirubin Total: 0.4 mg/dL (ref 0.0–1.2)
Bilirubin, Direct: 0.17 mg/dL (ref 0.00–0.40)
Total Protein: 6.6 g/dL (ref 6.0–8.5)

## 2019-12-19 LAB — PSA: Prostate Specific Ag, Serum: 0.3 ng/mL (ref 0.0–4.0)

## 2020-01-14 ENCOUNTER — Encounter: Payer: Self-pay | Admitting: Family Medicine

## 2020-01-14 ENCOUNTER — Ambulatory Visit (INDEPENDENT_AMBULATORY_CARE_PROVIDER_SITE_OTHER): Payer: 59 | Admitting: Family Medicine

## 2020-01-14 ENCOUNTER — Other Ambulatory Visit: Payer: Self-pay

## 2020-01-14 VITALS — BP 138/82 | HR 83 | Temp 94.9°F | Ht 72.0 in | Wt 174.4 lb

## 2020-01-14 DIAGNOSIS — R809 Proteinuria, unspecified: Secondary | ICD-10-CM | POA: Insufficient documentation

## 2020-01-14 DIAGNOSIS — I10 Essential (primary) hypertension: Secondary | ICD-10-CM

## 2020-01-14 DIAGNOSIS — Z23 Encounter for immunization: Secondary | ICD-10-CM | POA: Diagnosis not present

## 2020-01-14 DIAGNOSIS — E1129 Type 2 diabetes mellitus with other diabetic kidney complication: Secondary | ICD-10-CM

## 2020-01-14 DIAGNOSIS — E119 Type 2 diabetes mellitus without complications: Secondary | ICD-10-CM | POA: Diagnosis not present

## 2020-01-14 MED ORDER — METFORMIN HCL 500 MG PO TABS
ORAL_TABLET | ORAL | 1 refills | Status: DC
Start: 2020-01-14 — End: 2020-08-31

## 2020-01-14 MED ORDER — ATORVASTATIN CALCIUM 40 MG PO TABS
40.0000 mg | ORAL_TABLET | Freq: Every day | ORAL | 1 refills | Status: DC
Start: 2020-01-14 — End: 2020-12-22

## 2020-01-14 MED ORDER — LANTUS SOLOSTAR 100 UNIT/ML ~~LOC~~ SOPN: PEN_INJECTOR | SUBCUTANEOUS | 5 refills | Status: DC

## 2020-01-14 MED ORDER — LOSARTAN POTASSIUM 100 MG PO TABS
100.0000 mg | ORAL_TABLET | Freq: Every day | ORAL | 1 refills | Status: DC
Start: 2020-01-14 — End: 2020-12-22

## 2020-01-14 NOTE — Progress Notes (Signed)
Subjective:    Patient ID: Michael Fritz, male    DOB: 07-Jun-1965, 54 y.o.   MRN: [id number]  Diabetes He presents for his follow-up diabetic visit. He has type 2 diabetes mellitus. There are no hypoglycemic associated symptoms. Pertinent negatives for hypoglycemia include no confusion, dizziness or headaches. There are no diabetic associated symptoms. Pertinent negatives for diabetes include no chest pain and no fatigue. There are no hypoglycemic complications. There are no diabetic complications. He does not see a podiatrist.Eye exam current: HAS APPT THURSDAY.   Results for orders placed or performed in visit on 12/09/19  Lipid Profile  Result Value Ref Range   Cholesterol, Total 154 100 - 199 mg/dL   Triglycerides 45 0 - 149 mg/dL   HDL 70 >39 mg/dL   VLDL Cholesterol Cal 10 5 - 40 mg/dL   LDL Chol Calc (NIH) 74 0 - 99 mg/dL   Chol/HDL Ratio 2.2 0.0 - 5.0 ratio  Hepatic function panel  Result Value Ref Range   Total Protein 6.6 6.0 - 8.5 g/dL   Albumin 4.3 3.8 - 4.9 g/dL   Bilirubin Total 0.4 0.0 - 1.2 mg/dL   Bilirubin, Direct 0.17 0.00 - 0.40 mg/dL   Alkaline Phosphatase 60 44 - 121 IU/L   AST 17 0 - 40 IU/L   ALT 15 0 - 44 IU/L  Basic Metabolic Panel (BMET)  Result Value Ref Range   Glucose 137 (H) 65 - 99 mg/dL   BUN 11 6 - 24 mg/dL   Creatinine, Ser 1.17 0.76 - 1.27 mg/dL   GFR calc non Af Amer 70 >59 mL/min/1.73   GFR calc Af Amer 81 >59 mL/min/1.73   BUN/Creatinine Ratio 9 9 - 20   Sodium 139 134 - 144 mmol/L   Potassium 5.0 3.5 - 5.2 mmol/L   Chloride 102 96 - 106 mmol/L   CO2 24 20 - 29 mmol/L   Calcium 9.6 8.7 - 10.2 mg/dL  Urine Microalbumin w/creat. ratio  Result Value Ref Range   Creatinine, Urine 136.2 Not Estab. mg/dL   Microalbumin, Urine 500.5 Not Estab. ug/mL   Microalb/Creat Ratio 367 (H) 0 - 29 mg/g creat  Hemoglobin A1c  Result Value Ref Range   Hgb A1c MFr Bld 10.5 (H) 4.8 - 5.6 %   Est. average glucose Bld gHb Est-mCnc 255 mg/dL  PSA    Result Value Ref Range   Prostate Specific Ag, Serum 0.3 0.0 - 4.0 ng/mL    Need for vaccination - Plan: Flu Vaccine QUAD 6+ mos PF IM (Fluarix Quad PF)  Essential hypertension - Plan: Hemoglobin I9S, Basic Metabolic Panel (BMET)  Type 2 diabetes mellitus without complication, unspecified whether long term insulin use ([place]) - Plan: Hemoglobin W5I, Basic Metabolic Panel (BMET)  Proteinuria due to type 2 diabetes mellitus (H[facility])    Review of Systems  Constitutional: Negative for diaphoresis and fatigue.  HENT: Negative for congestion and rhinorrhea.   Respiratory: Negative for cough and shortness of breath.   Cardiovascular: Negative for chest pain and leg swelling.  Gastrointestinal: Negative for abdominal pain and diarrhea.  Skin: Negative for color change and rash.  Neurological: Negative for dizziness and headaches.  Psychiatric/Behavioral: Negative for behavioral problems and confusion.       Objective:   Physical Exam Vitals reviewed.  Constitutional:      General: He is not in acute distress. HENT:     Head: Normocephalic and atraumatic.  Eyes:     General:  Right eye: No discharge.        Left eye: No discharge.  Neck:     Trachea: No tracheal deviation.  Cardiovascular:     Rate and Rhythm: Normal rate and regular rhythm.     Heart sounds: Normal heart sounds. No murmur heard.   Pulmonary:     Effort: Pulmonary effort is normal. No respiratory distress.     Breath sounds: Normal breath sounds.  Lymphadenopathy:     Cervical: No cervical adenopathy.  Skin:    General: Skin is warm and dry.  Neurological:     Mental Status: He is alert.     Coordination: Coordination normal.  Psychiatric:        Behavior: Behavior normal.      Patient defers on prostate exam today     Assessment & Plan:  1. Need for vaccination Flu shot today - Flu Vaccine QUAD 6+ mos PF IM (Fluarix Quad PF)  2. Essential hypertension Blood pressure good control continue  current measures - Hemoglobin A3E - Basic Metabolic Panel (BMET)  3. Type 2 diabetes mellitus without complication, unspecified whether long term insulin use ([facility]) Diabetes subpar control recheck A1c in approximately 3 months patient is a send [place] some readings for [place] to review encourage patient to get the A1c down to 7 to lessen the risk of severe complications patient in the past states that mealtime insulin causes dysfunction so therefore we will try to manipulate the long-acting insulin to keep things under control - Hemoglobin N4M - Basic Metabolic Panel (BMET)  4. Proteinuria due to type 2 diabetes mellitus ([place]) Continue losartan watch diet monitor closely try to get the A1c down  Patient encouraged to quit smoking

## 2020-01-14 NOTE — Patient Instructions (Signed)
Steps to Quit Smoking Smoking tobacco is the leading cause of preventable death. It can affect almost every organ in the body. Smoking puts you and people around you at risk for many serious, long-lasting (chronic) diseases. Quitting smoking can be hard, but it is one of the best things that you can do for your health. It is never too late to quit. How do I get ready to quit? When you decide to quit smoking, make a plan to help you succeed. Before you quit:  Pick a date to quit. Set a date within the next 2 weeks to give you time to prepare.  Write down the reasons why you are quitting. Keep this list in places where you will see it often.  Tell your family, friends, and co-workers that you are quitting. Their support is important.  Talk with your doctor about the choices that may help you quit.  Find out if your health insurance will pay for these treatments.  Know the people, places, things, and activities that make you want to smoke (triggers). Avoid them. What first steps can I take to quit smoking?  Throw away all cigarettes at home, at work, and in your car.  Throw away the things that you use when you smoke, such as ashtrays and lighters.  Clean your car. Make sure to empty the ashtray.  Clean your home, including curtains and carpets. What can I do to help me quit smoking? Talk with your doctor about taking medicines and seeing a counselor at the same time. You are more likely to succeed when you do both.  If you are pregnant or breastfeeding, talk with your doctor about counseling or other ways to quit smoking. Do not take medicine to help you quit smoking unless your doctor tells you to do so. To quit smoking: Quit right away  Quit smoking totally, instead of slowly cutting back on how much you smoke over a period of time.  Go to counseling. You are more likely to quit if you go to counseling sessions regularly. Take medicine You may take medicines to help you quit. Some  medicines need a prescription, and some you can buy over-the-counter. Some medicines may contain a drug called nicotine to replace the nicotine in cigarettes. Medicines may:  Help you to stop having the desire to smoke (cravings).  Help to stop the problems that come when you stop smoking (withdrawal symptoms). Your doctor may ask you to use:  Nicotine patches, gum, or lozenges.  Nicotine inhalers or sprays.  Non-nicotine medicine that is taken by mouth. Find resources Find resources and other ways to help you quit smoking and remain smoke-free after you quit. These resources are most helpful when you use them often. They include:  Online chats with a Social worker.  Phone quitlines.  Printed Furniture conservator/restorer.  Support groups or group counseling.  Text messaging programs.  Mobile phone apps. Use apps on your mobile phone or tablet that can help you stick to your quit plan. There are many free apps for mobile phones and tablets as well as websites. Examples include Quit Guide from the [employer] and smokefree.gov  What things can I do to make it easier to quit?   Talk to your family and friends. Ask them to support and encourage you.  Call a phone quitline (1-800-QUIT-NOW), reach out to support groups, or work with a Social worker.  Ask people who smoke to not smoke around you.  Avoid places that make you want to smoke,  such as: ? Bars. ? Parties. ? Smoke-break areas at work.  Spend time with people who do not smoke.  Lower the stress in your life. Stress can make you want to smoke. Try these things to help your stress: ? Getting regular exercise. ? Doing deep-breathing exercises. ? Doing yoga. ? Meditating. ? Doing a body scan. To do this, close your eyes, focus on one area of your body at a time from head to toe. Notice which parts of your body are tense. Try to relax the muscles in those areas. How will I feel when I quit smoking? Day 1 to 3 weeks Within the first 24 hours,  you may start to have some problems that come from quitting tobacco. These problems are very bad 2-3 days after you quit, but they do not often last for more than 2-3 weeks. You may get these symptoms:  Mood swings.  Feeling restless, nervous, angry, or annoyed.  Trouble concentrating.  Dizziness.  Strong desire for high-sugar foods and nicotine.  Weight gain.  Trouble pooping (constipation).  Feeling like you may vomit (nausea).  Coughing or a sore throat.  Changes in how the medicines that you take for other issues work in your body.  Depression.  Trouble sleeping (insomnia). Week 3 and afterward After the first 2-3 weeks of quitting, you may start to notice more positive results, such as:  Better sense of smell and taste.  Less coughing and sore throat.  Slower heart rate.  Lower blood pressure.  Clearer skin.  Better breathing.  Fewer sick days. Quitting smoking can be hard. Do not give up if you fail the first time. Some people need to try a few times before they succeed. Do your best to stick to your quit plan, and talk with your doctor if you have any questions or concerns. Summary  Smoking tobacco is the leading cause of preventable death. Quitting smoking can be hard, but it is one of the best things that you can do for your health.  When you decide to quit smoking, make a plan to help you succeed.  Quit smoking right away, not slowly over a period of time.  When you start quitting, seek help from your doctor, family, or friends. This information is not intended to replace advice given to you by your health care provider. Make sure you discuss any questions you have with your health care provider. Document Revised: 11/16/2018 Document Reviewed: 05/12/2018 Elsevier Patient Education  [place]. Diabetes Mellitus and Nutrition, Adult When you have diabetes (diabetes mellitus), it is very important to have healthy eating habits because your blood  sugar (glucose) levels are greatly affected by what you eat and drink. Eating healthy foods in the appropriate amounts, at about the same times every day, can help you:  Control your blood glucose.  Lower your risk of heart disease.  Improve your blood pressure.  Reach or maintain a healthy weight. Every person with diabetes is different, and each person has different needs for a meal plan. Your health care provider may recommend that you work with a diet and nutrition specialist (dietitian) to make a meal plan that is best for you. Your meal plan may vary depending on factors such as:  The calories you need.  The medicines you take.  Your weight.  Your blood glucose, blood pressure, and cholesterol levels.  Your activity level.  Other health conditions you have, such as heart or kidney disease. How do carbohydrates affect me?  Carbohydrates, also called carbs, affect your blood glucose level more than any other type of food. Eating carbs naturally raises the amount of glucose in your blood. Carb counting is a method for keeping track of how many carbs you eat. Counting carbs is important to keep your blood glucose at a healthy level, especially if you use insulin or take certain oral diabetes medicines. It is important to know how many carbs you can safely have in each meal. This is different for every person. Your dietitian can help you calculate how many carbs you should have at each meal and for each snack. Foods that contain carbs include:  Bread, cereal, rice, pasta, and crackers.  Potatoes and corn.  Peas, beans, and lentils.  Milk and yogurt.  Fruit and juice.  Desserts, such as cakes, cookies, ice cream, and candy. How does alcohol affect me? Alcohol can cause a sudden decrease in blood glucose (hypoglycemia), especially if you use insulin or take certain oral diabetes medicines. Hypoglycemia can be a life-threatening condition. Symptoms of hypoglycemia (sleepiness,  dizziness, and confusion) are similar to symptoms of having too much alcohol. If your health care provider says that alcohol is safe for you, follow these guidelines:  Limit alcohol intake to no more than 1 drink per day for nonpregnant women and 2 drinks per day for men. One drink equals 12 oz of beer, 5 oz of wine, or 1 oz of hard liquor.  Do not drink on an empty stomach.  Keep yourself hydrated with water, diet soda, or unsweetened iced tea.  Keep in mind that regular soda, juice, and other mixers may contain a lot of sugar and must be counted as carbs. What are tips for following this plan?  Reading food labels  Start by checking the serving size on the "Nutrition Facts" label of packaged foods and drinks. The amount of calories, carbs, fats, and other nutrients listed on the label is based on one serving of the item. Many items contain more than one serving per package.  Check the total grams (g) of carbs in one serving. You can calculate the number of servings of carbs in one serving by dividing the total carbs by 15. For example, if a food has 30 g of total carbs, it would be equal to 2 servings of carbs.  Check the number of grams (g) of saturated and trans fats in one serving. Choose foods that have low or no amount of these fats.  Check the number of milligrams (mg) of salt (sodium) in one serving. Most people should limit total sodium intake to less than 2,300 mg per day.  Always check the nutrition information of foods labeled as "low-fat" or "nonfat". These foods may be higher in added sugar or refined carbs and should be avoided.  Talk to your dietitian to identify your daily goals for nutrients listed on the label. Shopping  Avoid buying canned, premade, or processed foods. These foods tend to be high in fat, sodium, and added sugar.  Shop around the outside edge of the grocery store. This includes fresh fruits and vegetables, bulk grains, fresh meats, and fresh  dairy. Cooking  Use low-heat cooking methods, such as baking, instead of high-heat cooking methods like deep frying.  Cook using healthy oils, such as olive, canola, or sunflower oil.  Avoid cooking with butter, cream, or high-fat meats. Meal planning  Eat meals and snacks regularly, preferably at the same times every day. Avoid going long periods of time without  eating.  Eat foods high in fiber, such as fresh fruits, vegetables, beans, and whole grains. Talk to your dietitian about how many servings of carbs you can eat at each meal.  Eat 4-6 ounces (oz) of lean protein each day, such as lean meat, chicken, fish, eggs, or tofu. One oz of lean protein is equal to: ? 1 oz of meat, chicken, or fish. ? 1 egg. ?  cup of tofu.  Eat some foods each day that contain healthy fats, such as avocado, nuts, seeds, and fish. Lifestyle  Check your blood glucose regularly.  Exercise regularly as told by your health care provider. This may include: ? 150 minutes of moderate-intensity or vigorous-intensity exercise each week. This could be brisk walking, biking, or water aerobics. ? Stretching and doing strength exercises, such as yoga or weightlifting, at least 2 times a week.  Take medicines as told by your health care provider.  Do not use any products that contain nicotine or tobacco, such as cigarettes and e-cigarettes. If you need help quitting, ask your health care provider.  Work with a Social worker or diabetes educator to identify strategies to manage stress and any emotional and social challenges. Questions to ask a health care provider  Do I need to meet with a diabetes educator?  Do I need to meet with a dietitian?  What number can I call if I have questions?  When are the best times to check my blood glucose? Where to find more information:  American Diabetes Association: diabetes.org  Academy of Nutrition and Dietetics: www.eatright.[employer] of Diabetes and  Digestive and Kidney Diseases (NIH): [employer] Summary  A healthy meal plan will help you control your blood glucose and maintain a healthy lifestyle.  Working with a diet and nutrition specialist (dietitian) can help you make a meal plan that is best for you.  Keep in mind that carbohydrates (carbs) and alcohol have immediate effects on your blood glucose levels. It is important to count carbs and to use alcohol carefully. This information is not intended to replace advice given to you by your health care provider. Make sure you discuss any questions you have with your health care provider. Document Revised: 02/03/2017 Document Reviewed: 03/28/2016 Elsevier Patient Education  2020 [employer].

## 2020-01-15 ENCOUNTER — Other Ambulatory Visit: Payer: Self-pay | Admitting: *Deleted

## 2020-01-15 DIAGNOSIS — Z1211 Encounter for screening for malignant neoplasm of colon: Secondary | ICD-10-CM

## 2020-01-16 ENCOUNTER — Encounter (INDEPENDENT_AMBULATORY_CARE_PROVIDER_SITE_OTHER): Payer: Self-pay | Admitting: *Deleted

## 2020-01-16 LAB — HM DIABETES EYE EXAM

## 2020-03-06 ENCOUNTER — Other Ambulatory Visit: Payer: Self-pay | Admitting: Family Medicine

## 2020-08-05 ENCOUNTER — Other Ambulatory Visit: Payer: Self-pay

## 2020-08-05 ENCOUNTER — Telehealth: Payer: Self-pay

## 2020-08-05 DIAGNOSIS — E119 Type 2 diabetes mellitus without complications: Secondary | ICD-10-CM

## 2020-08-05 DIAGNOSIS — E1129 Type 2 diabetes mellitus with other diabetic kidney complication: Secondary | ICD-10-CM

## 2020-08-05 DIAGNOSIS — I10 Essential (primary) hypertension: Secondary | ICD-10-CM

## 2020-08-05 NOTE — Telephone Encounter (Signed)
A1c, urine micro protein, CMP, lipid  It is not time for the patient to get PSA at this point-not due on PSA until in October

## 2020-08-05 NOTE — Telephone Encounter (Signed)
Pt needs blood work before June 27 and wants to know if it can be ordered by Monday the 6th   Pt call back [phone]

## 2020-08-05 NOTE — Telephone Encounter (Signed)
Last labs 10/21: Lipid, Liver, Met 7, urine micro, HgbA1c, PSA

## 2020-08-05 NOTE — Telephone Encounter (Signed)
Spoke with patient in regards to lab orders.

## 2020-08-11 LAB — LIPID PANEL
Chol/HDL Ratio: 2.6 ratio (ref 0.0–5.0)
Cholesterol, Total: 192 mg/dL (ref 100–199)
HDL: 75 mg/dL (ref >39)
LDL Chol Calc (NIH): 103 mg/dL — ABNORMAL HIGH (ref 0–99)
Triglycerides: 80 mg/dL (ref 0–149)
VLDL Cholesterol Cal: 14 mg/dL (ref 5–40)

## 2020-08-11 LAB — COMPREHENSIVE METABOLIC PANEL
ALT: 14 IU/L (ref 0–44)
AST: 12 IU/L (ref 0–40)
Albumin/Globulin Ratio: 1.5 (ref 1.2–2.2)
Albumin: 4.1 g/dL (ref 3.8–4.9)
Alkaline Phosphatase: 76 IU/L (ref 44–121)
BUN/Creatinine Ratio: 13 (ref 9–20)
BUN: 16 mg/dL (ref 6–24)
Bilirubin Total: 0.3 mg/dL (ref 0.0–1.2)
CO2: 25 mmol/L (ref 20–29)
Calcium: 9.8 mg/dL (ref 8.7–10.2)
Chloride: 102 mmol/L (ref 96–106)
Creatinine, Ser: 1.25 mg/dL (ref 0.76–1.27)
Globulin, Total: 2.7 g/dL (ref 1.5–4.5)
Glucose: 66 mg/dL (ref 65–99)
Potassium: 5.6 mmol/L — ABNORMAL HIGH (ref 3.5–5.2)
Sodium: 140 mmol/L (ref 134–144)
Total Protein: 6.8 g/dL (ref 6.0–8.5)
eGFR: 68 mL/min/{1.73_m2} (ref >59)

## 2020-08-11 LAB — MICROALBUMIN / CREATININE URINE RATIO
Creatinine, Urine: 73.3 mg/dL
Microalb/Creat Ratio: 603 mg/g creat — ABNORMAL HIGH (ref 0–29)
Microalbumin, Urine: 441.8 ug/mL

## 2020-08-11 LAB — HEMOGLOBIN A1C
Est. average glucose Bld gHb Est-mCnc: 280 mg/dL
Hgb A1c MFr Bld: 11.4 % — ABNORMAL HIGH (ref 4.8–5.6)

## 2020-08-31 ENCOUNTER — Ambulatory Visit: Payer: 59 | Admitting: Family Medicine

## 2020-08-31 ENCOUNTER — Encounter: Payer: Self-pay | Admitting: Family Medicine

## 2020-08-31 ENCOUNTER — Other Ambulatory Visit (HOSPITAL_COMMUNITY): Payer: Self-pay | Admitting: Family Medicine

## 2020-08-31 ENCOUNTER — Other Ambulatory Visit: Payer: Self-pay

## 2020-08-31 VITALS — BP 140/68 | HR 89 | Temp 97.7°F | Ht 72.0 in | Wt 167.0 lb

## 2020-08-31 DIAGNOSIS — E7849 Other hyperlipidemia: Secondary | ICD-10-CM

## 2020-08-31 DIAGNOSIS — R809 Proteinuria, unspecified: Secondary | ICD-10-CM

## 2020-08-31 DIAGNOSIS — Z79899 Other long term (current) drug therapy: Secondary | ICD-10-CM

## 2020-08-31 DIAGNOSIS — M79671 Pain in right foot: Secondary | ICD-10-CM

## 2020-08-31 DIAGNOSIS — E1129 Type 2 diabetes mellitus with other diabetic kidney complication: Secondary | ICD-10-CM | POA: Diagnosis not present

## 2020-08-31 DIAGNOSIS — E119 Type 2 diabetes mellitus without complications: Secondary | ICD-10-CM | POA: Diagnosis not present

## 2020-08-31 DIAGNOSIS — Z125 Encounter for screening for malignant neoplasm of prostate: Secondary | ICD-10-CM

## 2020-08-31 DIAGNOSIS — I10 Essential (primary) hypertension: Secondary | ICD-10-CM

## 2020-08-31 MED ORDER — METFORMIN HCL ER 500 MG PO TB24: ORAL_TABLET | ORAL | 5 refills | Status: DC

## 2020-08-31 NOTE — Patient Instructions (Signed)
Steps to Quit Smoking Smoking tobacco is the leading cause of preventable death. It can affect almost every organ in the body. Smoking puts you and people around you at risk for many serious, long-lasting (chronic) diseases. Quitting smoking can be hard, but it is one of the best things that you can do for your health. It is never too late to quit. How do I get ready to quit? When you decide to quit smoking, make a plan to help you succeed. Before you quit: Pick a date to quit. Set a date within the next 2 weeks to give you time to prepare. Write down the reasons why you are quitting. Keep this list in places where you will see it often. Tell your family, friends, and co-workers that you are quitting. Their support is important. Talk with your doctor about the choices that may help you quit. Find out if your health insurance will pay for these treatments. Know the people, places, things, and activities that make you want to smoke (triggers). Avoid them. What first steps can I take to quit smoking? Throw away all cigarettes at home, at work, and in your car. Throw away the things that you use when you smoke, such as ashtrays and lighters. Clean your car. Make sure to empty the ashtray. Clean your home, including curtains and carpets. What can I do to help me quit smoking? Talk with your doctor about taking medicines and seeing a counselor at the same time. You are more likely to succeed when you do both. If you are pregnant or breastfeeding, talk with your doctor about counseling or other ways to quit smoking. Do not take medicine to help you quit smoking unless your doctor tells you to do so. To quit smoking: Quit right away Quit smoking totally, instead of slowly cutting back on how much you smoke over a period of time. Go to counseling. You are more likely to quit if you go to counseling sessions regularly. Take medicine You may take medicines to help you quit. Some medicines need a  prescription, and some you can buy over-the-counter. Some medicines may contain a drug called nicotine to replace the nicotine in cigarettes. Medicines may: Help you to stop having the desire to smoke (cravings). Help to stop the problems that come when you stop smoking (withdrawal symptoms). Your doctor may ask you to use: Nicotine patches, gum, or lozenges. Nicotine inhalers or sprays. Non-nicotine medicine that is taken by mouth. Find resources Find resources and other ways to help you quit smoking and remain smoke-free after you quit. These resources are most helpful when you use them often. They include: Online chats with a counselor. Phone quitlines. Printed self-help materials. Support groups or group counseling. Text messaging programs. Mobile phone apps. Use apps on your mobile phone or tablet that can help you stick to your quit plan. There are many free apps for mobile phones and tablets as well as websites. Examples include Quit Guide from the CDC and smokefree.gov  What things can I do to make it easier to quit?  Talk to your family and friends. Ask them to support and encourage you. Call a phone quitline (1-800-QUIT-NOW), reach out to support groups, or work with a counselor. Ask people who smoke to not smoke around you. Avoid places that make you want to smoke, such as: Bars. Parties. Smoke-break areas at work. Spend time with people who do not smoke. Lower the stress in your life. Stress can make you want to   smoke. Try these things to help your stress: Getting regular exercise. Doing deep-breathing exercises. Doing yoga. Meditating. Doing a body scan. To do this, close your eyes, focus on one area of your body at a time from head to toe. Notice which parts of your body are tense. Try to relax the muscles in those areas. How will I feel when I quit smoking? Day 1 to 3 weeks Within the first 24 hours, you may start to have some problems that come from quitting tobacco.  These problems are very bad 2-3 days after you quit, but they do not often last for more than 2-3 weeks. You may get these symptoms: Mood swings. Feeling restless, nervous, angry, or annoyed. Trouble concentrating. Dizziness. Strong desire for high-sugar foods and nicotine. Weight gain. Trouble pooping (constipation). Feeling like you may vomit (nausea). Coughing or a sore throat. Changes in how the medicines that you take for other issues work in your body. Depression. Trouble sleeping (insomnia). Week 3 and afterward After the first 2-3 weeks of quitting, you may start to notice more positive results, such as: Better sense of smell and taste. Less coughing and sore throat. Slower heart rate. Lower blood pressure. Clearer skin. Better breathing. Fewer sick days. Quitting smoking can be hard. Do not give up if you fail the first time. Some people need to try a few times before they succeed. Do your best to stick to your quit plan, and talk with your doctor if you have any questions or concerns. Summary Smoking tobacco is the leading cause of preventable death. Quitting smoking can be hard, but it is one of the best things that you can do for your health. When you decide to quit smoking, make a plan to help you succeed. Quit smoking right away, not slowly over a period of time. When you start quitting, seek help from your doctor, family, or friends. This information is not intended to replace advice given to you by your health care provider. Make sure you discuss any questions you have with your health care provider. Document Revised: 11/16/2018 Document Reviewed: 05/12/2018 Elsevier Patient Education  2022 Else[employer] Inc.  

## 2020-08-31 NOTE — Progress Notes (Signed)
   Subjective:    Patient ID: Michael Fritz, male    DOB: 1965/05/20, 55 y.o.   MRN: [id number]  Diabetes He has type 2 diabetes mellitus. Current diabetic treatments: lantus, metformin.  Patient states sugars run anywhere between 130 and all the way up to 200 currently just using long-acting insulin not using short acting insulin states his eating habits are erratic because of his work schedule  Callous on bottom R foot - worsened in last few months- referral to podiatry states this area has become more sore tender painful along with some blistering he does have significant neuropathy of the feet where he has burning and numbness in the lower portions of his ankle and feet Review of Systems     Objective:   Physical Exam General-in no acute distress Eyes-no discharge Lungs-respiratory rate normal, CTA CV-no murmurs,RRR Extremities skin warm dry no edema Neuro grossly normal Behavior normal, alert  Diabetic foot exam he has a large callus that is been undermined with bleeding on the right side we will refer to podiatry diminished pulses we will check ABI      Assessment & Plan:  Patient has been counseled to quit smoking currently not quitting but we may try tapering he states he cannot afford Chantix  1. Other hyperlipidemia Continue cholesterol medicine watch diet stay active  2. Proteinuria due to type 2 diabetes mellitus ([place]) Significant proteinuria very important to get diabetes under better control to get the A1c near 7 the best its been was 8 last year he states that he will work harder on diet I told him to bump up his long-acting insulin to 58 to 60 units patient does not want to be on short acting currently recheck A1c in approximately 3 to 4 months with follow-up visit  3. Right foot pain Referral to podiatry has what appears to be a developing callus with the possible ulcerative area underneath diminished pulses will check ABI - Ambulatory referral to Podiatry -  US ARTERIAL ABI (SCREENING LOWER EXTREMITY)  4. Type 2 diabetes mellitus without complication, unspecified whether long term insulin use (HCC) A1c way too high very important do better with diet and recheck A1c in several months increase insulin as stated above - Hemoglobin F2T - Basic metabolic panel  5. Essential hypertension Blood pressure fair continue medication eat healthy drink plenty of fluids - Basic metabolic panel  6. Screening for prostate cancer Screening PSA wellness on next visit with diabetic check - PSA  7. High risk medication use Wellness next visit diabetic check - Basic metabolic panel

## 2020-09-02 ENCOUNTER — Ambulatory Visit: Payer: 59 | Admitting: Podiatry

## 2020-09-02 ENCOUNTER — Other Ambulatory Visit: Payer: Self-pay

## 2020-09-02 ENCOUNTER — Ambulatory Visit (INDEPENDENT_AMBULATORY_CARE_PROVIDER_SITE_OTHER): Payer: 59

## 2020-09-02 DIAGNOSIS — L97512 Non-pressure chronic ulcer of other part of right foot with fat layer exposed: Secondary | ICD-10-CM

## 2020-09-02 DIAGNOSIS — E0843 Diabetes mellitus due to underlying condition with diabetic autonomic (poly)neuropathy: Secondary | ICD-10-CM | POA: Diagnosis not present

## 2020-09-02 DIAGNOSIS — M2041 Other hammer toe(s) (acquired), right foot: Secondary | ICD-10-CM

## 2020-09-02 MED ORDER — DOXYCYCLINE HYCLATE 100 MG PO TABS: 100.0000 mg | ORAL_TABLET | Freq: Two times a day (BID) | ORAL | 0 refills | Status: DC

## 2020-09-02 NOTE — Progress Notes (Signed)
Subjective:  55 y.o. 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  Patient states that he has had a recurrent ulcer and callus formed to the subsecond MTPJ for several years.  Approximately 15 years ago he was seen by podiatrist who recommended surgery, however he was not in a position to do so.  He would like to pursue surgery at this time to permanently alleviate the recurrent callus since he is diabetic and these recurrent wounds to this area have a high likelihood of complication.  He presents for further treatment and evaluation   Past Medical History:  Diagnosis Date   Adhesive capsulitis of right shoulder 01/17/2014   Diabetes mellitus without complication ([place])    Hyperlipemia    Hypertension    Peyronie's disease 04/15/2019   Dr. Alyson Ingles urology   Wears dentures    top      Objective/Physical Exam General: The patient is alert and oriented x3 in no acute distress.  Dermatology:  Wound #1 noted to the plantar aspect of the fifth MTPJ measuring 1.0 x 1.0 x 0.3 cm (LxWxD).   To the noted ulceration(s), there is no eschar. There is a moderate amount of slough, fibrin, and necrotic tissue noted. Granulation tissue and wound base is red. There is a minimal amount of serosanguineous drainage noted. There is no exposed bone muscle-tendon ligament or joint. There is no malodor. Periwound integrity is intact. Skin is warm, dry and supple bilateral lower extremities.  Vascular: Palpable pedal pulses bilaterally. No edema or erythema noted. Capillary refill within normal limits.  Neurological: Epicritic and protective threshold diminished bilaterally.   Musculoskeletal Exam: Range of motion within normal limits to all pedal and ankle joints bilateral. Muscle strength 5/5 in all groups bilateral.   Radiographic exam: Plantarflexed fifth metatarsal with hammertoe  deformity of the toes noted.  No fractures identified.  Normal osseous mineralization.  Joint spaces preserved.  No cortical erosion or concern for osteomyelitis  Assessment: 1.  Ulcer septic MTPJ right foot secondary to diabetes mellitus 2. diabetes mellitus w/ peripheral neuropathy 3.  Hammertoe deformity right foot   Plan of Care:  1. Patient was evaluated. 2. medically necessary excisional debridement including subcutaneous tissue was performed using a tissue nipper and a chisel blade. Excisional debridement of all the necrotic nonviable tissue down to healthy bleeding viable tissue was performed with post-debridement measurements same as pre-. 3. the wound was cleansed and dry sterile dressing applied. 4.  Cultures were taken and sent to pathology for culture and sensitivities  5.  Prescription for doxycycline 100 mg 2 times daily #20  6.  Explained to the patient that the underlying cause of the chronic callus and recurrent ulcer that is been present for several years is due to the plantar flexed fifth metatarsal bone and hammertoe deformity that is additionally plantar flexing the fifth metatarsal.  I do recommend surgery, just like the previous podiatrist 15 years ago suggested 7. Today we discussed the conservative versus surgical management of the presenting pathology. The patient opts for surgical management. All possible complications and details of the procedure were explained. All patient questions were answered. No guarantees were expressed or implied. 8. Authorization for surgery was initiated today. Surgery will consist of fifth metatarsal head resection right foot 9.  Return to clinic 1 week postop  *Works at [employer] 12-hour shifts x20 years  Edrick Kins, DPM [facility]  Dr. Edrick Kins, DPM    [address], [place] [address]                Office [phone]  Fax [phone]

## 2020-09-06 LAB — WOUND CULTURE
MICRO NUMBER:: 12064982
SPECIMEN QUALITY:: ADEQUATE

## 2020-09-10 DIAGNOSIS — M79676 Pain in unspecified toe(s): Secondary | ICD-10-CM

## 2020-09-17 ENCOUNTER — Ambulatory Visit (HOSPITAL_COMMUNITY): Payer: 59

## 2020-10-14 ENCOUNTER — Telehealth: Payer: Self-pay | Admitting: Urology

## 2020-10-14 NOTE — Telephone Encounter (Signed)
DOS - 10/29/20  MET HEAD RESECTION 5TH RIGHT --- [address]   [facility] EFFECTIVE DATE - 03/07/20   PLAN DEDUCTIBLE - $600.00 W/ $13.72 REMAINING OUT OF POCKET - $3,000.00 W/ $2,331.36 REMAINING COINSURANCE - 15% COPAY -  $200.00   PER [employer] WEB SITE FOR CPT CODE 26415 Notification or Prior Authorization is not required for the requested services   Decision ID #:[id number]

## 2020-10-29 ENCOUNTER — Encounter: Payer: Self-pay | Admitting: Podiatry

## 2020-10-29 ENCOUNTER — Other Ambulatory Visit: Payer: Self-pay | Admitting: Podiatry

## 2020-10-29 DIAGNOSIS — M21541 Acquired clubfoot, right foot: Secondary | ICD-10-CM | POA: Diagnosis not present

## 2020-10-29 MED ORDER — OXYCODONE-ACETAMINOPHEN 5-325 MG PO TABS: 1.0000 | ORAL_TABLET | ORAL | 0 refills | Status: DC | PRN

## 2020-10-29 MED ORDER — DOXYCYCLINE HYCLATE 100 MG PO TABS: 100.0000 mg | ORAL_TABLET | Freq: Two times a day (BID) | ORAL | 0 refills | Status: DC

## 2020-10-29 NOTE — Progress Notes (Signed)
PRN postop 

## 2020-11-04 ENCOUNTER — Ambulatory Visit (INDEPENDENT_AMBULATORY_CARE_PROVIDER_SITE_OTHER): Payer: 59 | Admitting: Podiatry

## 2020-11-04 ENCOUNTER — Other Ambulatory Visit: Payer: Self-pay

## 2020-11-04 ENCOUNTER — Ambulatory Visit (INDEPENDENT_AMBULATORY_CARE_PROVIDER_SITE_OTHER): Payer: 59

## 2020-11-04 DIAGNOSIS — Z9889 Other specified postprocedural states: Secondary | ICD-10-CM | POA: Diagnosis not present

## 2020-11-04 NOTE — Progress Notes (Signed)
   Subjective:  Patient presents today status post fifth metatarsal head resection right foot. DOS: 10/29/2020.  Patient states that he is feeling very well.  He has no pain associated to his foot.  He is kept the dressings clean dry and intact as instructed.  No new complaints at this time  Past Medical History:  Diagnosis Date   Adhesive capsulitis of right shoulder 01/17/2014   Diabetes mellitus without complication (HCC)    Hyperlipemia    Hypertension    Peyronie's disease 04/15/2019   Dr. Alyson Ingles urology   Wears dentures    top      Objective/Physical Exam Neurovascular status intact.  Skin incisions appear to be well coapted with staples intact. No sign of infectious process noted. No dehiscence. No active bleeding noted. Moderate edema noted to the surgical extremity.  Radiographic Exam:  Absence of the fifth metatarsal head noted.  Osteotomy site appears clean with routine healing  Assessment: 1. s/p fifth metatarsal head resection right foot. DOS: 10/29/2020   Plan of Care:  1. Patient was evaluated. X-rays reviewed 2.  Dressings changed.  Patient may begin washing and showering and getting the foot wet 3.  Postsurgical shoe dispensed.  Discontinue cam boot 4.  Return to clinic in 1 week for staple removal   Edrick Kins, DPM [facility]  Dr. Edrick Kins, DPM    [address], [place] [address]                Office [phone]  Fax [phone]

## 2020-11-11 ENCOUNTER — Ambulatory Visit (INDEPENDENT_AMBULATORY_CARE_PROVIDER_SITE_OTHER): Payer: 59 | Admitting: Podiatry

## 2020-11-11 ENCOUNTER — Other Ambulatory Visit: Payer: Self-pay

## 2020-11-11 DIAGNOSIS — Z9889 Other specified postprocedural states: Secondary | ICD-10-CM

## 2020-11-11 NOTE — Progress Notes (Signed)
   Subjective:  Patient presents today status post fifth metatarsal head resection right foot. DOS: 10/29/2020.  Patient states that he is feeling very well.  He has no pain associated to his foot.  Overall he is doing well in the surgical shoe.  No new complaints at this time  Past Medical History:  Diagnosis Date   Adhesive capsulitis of right shoulder 01/17/2014   Diabetes mellitus without complication (HCC)    Hyperlipemia    Hypertension    Peyronie's disease 04/15/2019   Dr. Alyson Ingles urology   Wears dentures    top      Objective/Physical Exam Neurovascular status intact.  Skin incisions appear to be well coapted with staples intact. No sign of infectious process noted. No dehiscence. No active bleeding noted.  Improved edema noted to the surgical extremity.   Assessment: 1. s/p fifth metatarsal head resection right foot. DOS: 10/29/2020   Plan of Care:  1. Patient was evaluated.  2.  Staples removed today.  Light dressing applied. 3.  Patient may begin to transition out of the postsurgical shoe into good supportive sneakers or stability shoes 4.  Return to clinic in 4 weeks for final follow-up x-ray and evaluation  Edrick Kins, DPM [facility]  Dr. Edrick Kins, DPM    [address], [place] [address]                Office [phone]  Fax [phone]

## 2020-11-23 ENCOUNTER — Other Ambulatory Visit: Payer: Self-pay

## 2020-11-23 ENCOUNTER — Encounter: Payer: 59 | Admitting: Podiatry

## 2020-11-23 ENCOUNTER — Telehealth: Payer: Self-pay | Admitting: Family Medicine

## 2020-11-23 ENCOUNTER — Ambulatory Visit (INDEPENDENT_AMBULATORY_CARE_PROVIDER_SITE_OTHER): Payer: 59 | Admitting: Family Medicine

## 2020-11-23 ENCOUNTER — Telehealth: Payer: 59 | Admitting: Physician Assistant

## 2020-11-23 DIAGNOSIS — Z20822 Contact with and (suspected) exposure to covid-19: Secondary | ICD-10-CM

## 2020-11-23 DIAGNOSIS — U071 COVID-19: Secondary | ICD-10-CM

## 2020-11-23 MED ORDER — BENZONATATE 100 MG PO CAPS: 100.0000 mg | ORAL_CAPSULE | Freq: Three times a day (TID) | ORAL | 0 refills | Status: DC | PRN

## 2020-11-23 MED ORDER — NIRMATRELVIR/RITONAVIR (PAXLOVID)TABLET: 3.0000 | ORAL_TABLET | Freq: Two times a day (BID) | ORAL | 0 refills | Status: AC

## 2020-11-23 MED ORDER — BD PEN NEEDLE NANO U/F 32G X 4 MM MISC: 2 refills | Status: AC

## 2020-11-23 NOTE — Telephone Encounter (Signed)
Prescription sent electronically to pharmacy. Patient notified. 

## 2020-11-23 NOTE — Telephone Encounter (Signed)
I did do a virtual visit with the patient thank you

## 2020-11-23 NOTE — Telephone Encounter (Signed)
Patient is requesting prescription for lantus needles to be sent in to [facility]

## 2020-11-23 NOTE — Telephone Encounter (Signed)
Wife stated she gave Michael Fritz an COVID test this afternoon and it was positive. He also had an eVisit earlier. She is requesting any meds to get him through this. They use [facility] on [address]   CB#  [phone]

## 2020-11-23 NOTE — Progress Notes (Signed)
We are sorry that you are not feeling well.  Here is how we plan to help!  Based on your presentation I believe you most likely have A cough due to a virus.  This is called viral bronchitis and is best treated by rest, plenty of fluids and control of the cough.  You may use Ibuprofen or Tylenol as directed to help your symptoms.     In addition you may use A prescription cough medication called Tessalon Perles 100mg . You may take 1-2 capsules every 8 hours as needed for your cough.  E-Visit for Corona Virus Screening  Your current symptoms could be consistent with the coronavirus.  Many health care providers can now test patients at their office but not all are.  [place] has multiple testing sites. For information on our [place] testing locations and hours go to [email or web address]  We are enrolling you in our [place] for [place] . Daily you will receive a questionnaire within the [place] website. Our COVID 19 response team will be monitoring your responses daily.  Testing Information: The COVID-19 Community Testing sites are testing BY APPOINTMENT ONLY.  You can schedule online at [email or web address]  If you do not have access to a smart phone or computer you may call [phone] for an appointment.  I would recommend for you to continue to test for Covid 19 once daily or every other day while symptoms persist since you have had a positive exposure.  If you are interested in flu testing as well, please seek care at your PCP or local UC for further testing.   Additional testing sites in the Community:  For [facility] Testing sites in [place]  FaceUpdate.uy  For Pop-up testing sites in [place]  BowlDirectory.[email or web address]  For [facility]  [email or web address]  For [facility] testing in [place] and [employer] [email or web address]  For [facility] testing in [place]   [email or web address]  For  more information about community testing call [phone]   Please quarantine yourself while awaiting your test results. Please stay home for a minimum of 10 days from the first day of illness with improving symptoms and you have had 24 hours of no fever (without the use of Tylenol (Acetaminophen) Motrin (Ibuprofen) or any fever reducing medication).  Also - Do not get tested prior to returning to work because once you have had a positive test the test can stay positive for more than a month in some cases.   You should wear a mask or cloth face covering over your nose and mouth if you must be around other people or animals, including pets (even at home). Try to stay at least 6 feet away from other people. This will protect the people around you.  Please continue good preventive care measures, including:  frequent hand-washing, avoid touching your face, cover coughs/sneezes, stay out of crowds and keep a 6 foot distance from others.  COVID-19 is a respiratory illness with symptoms that are similar to the flu. Symptoms are typically mild to moderate, but there have been cases of severe illness and death due to the virus.   The following symptoms may appear 2-14 days after exposure: Fever Cough Shortness of breath or difficulty breathing Chills Repeated shaking with chills Muscle pain Headache Sore throat New loss of taste or smell Fatigue Congestion or runny nose Nausea or vomiting Diarrhea  Go to the nearest hospital ED for assessment if fever/cough/breathlessness are severe or illness seems like a threat to life.  It is  vitally important that if you feel that you have an infection such as this virus or any other virus that you stay home and away from places where you may spread it to others.  You should avoid contact with people age 19 and older.   You may also take acetaminophen (Tylenol) as needed for fever.  Reduce your risk of any infection by using the same precautions used for avoiding the common cold or flu:  Wash your hands often with soap and warm water for at least 20 seconds.  If soap and water are not readily available, use an alcohol-based hand sanitizer with at least 60% alcohol.  If coughing or sneezing, cover your mouth and nose by coughing or sneezing into the elbow areas of your shirt or coat, into a tissue or into your sleeve (not your hands). Avoid shaking hands with others and consider head nods or verbal greetings only. Avoid touching your eyes, nose, or mouth with unwashed hands.  Avoid close contact with people who are sick. Avoid places or events with large numbers of people in one location, like concerts or sporting events. Carefully consider travel plans you have or are making. If you are planning any travel outside or inside the [place], visit the CDC's Travelers' Health webpage for the latest health notices. If you have some symptoms but not all symptoms, continue to monitor at home and seek medical attention if your symptoms worsen. If you are having a medical emergency, call 911.  HOME CARE Only take medications as instructed by your medical team. Drink plenty of fluids and get plenty of rest. A steam or ultrasonic humidifier can help if you have congestion.   GET HELP RIGHT AWAY IF YOU HAVE EMERGENCY WARNING SIGNS** FOR COVID-19. If you or someone is showing any of these signs seek emergency medical care immediately. Call 911 or proceed to your closest emergency facility if: You develop worsening high fever. Trouble breathing Bluish lips or face Persistent pain or pressure in the  chest New confusion Inability to wake or stay awake You cough up blood. Your symptoms become more severe  **This list is not all possible symptoms. Contact your medical provider for any symptoms that are sever or concerning to you.  MAKE SURE YOU  Understand these instructions. Will watch your condition. Will get help right away if you are not doing well or get worse.  From your responses in the eVisit questionnaire you describe inflammation in the upper respiratory tract which is causing a significant cough.  This is commonly called Bronchitis and has four common causes:   Allergies Viral Infections Acid Reflux Bacterial Infection Allergies, viruses and acid reflux are treated by controlling symptoms or eliminating the cause. An example might be a cough caused by taking certain blood pressure medications. You stop the cough by changing the medication. Another example might be a cough caused by acid reflux. Controlling the reflux helps control the cough.  USE OF BRONCHODILATOR ("RESCUE") INHALERS: There is a risk from using your bronchodilator too frequently.  The risk is that over-reliance on a medication which only relaxes the muscles surrounding the breathing tubes can reduce the effectiveness of medications prescribed to reduce swelling and congestion of the tubes themselves.  Although you feel brief relief from the bronchodilator inhaler, your asthma may actually be worsening with the tubes becoming more swollen and filled with mucus.  This can delay other crucial treatments, such as oral steroid medications. If you need to use  a bronchodilator inhaler daily, several times per day, you should discuss this with your provider.  There are probably better treatments that could be used to keep your asthma under control.     HOME CARE Only take medications as instructed by your medical team. Complete the entire course of an antibiotic. Drink plenty of fluids and get plenty of rest. Avoid  close contacts especially the very young and the elderly Cover your mouth if you cough or cough into your sleeve. Always remember to wash your hands A steam or ultrasonic humidifier can help congestion.   GET HELP RIGHT AWAY IF: You develop worsening fever. You become short of breath You cough up blood. Your symptoms persist after you have completed your treatment plan MAKE SURE YOU  Understand these instructions. Will watch your condition. Will get help right away if you are not doing well or get worse.    Thank you for choosing an e-visit.  Your e-visit answers were reviewed by a board certified advanced clinical practitioner to complete your personal care plan. Depending upon the condition, your plan could have included both over the counter or prescription medications.  Please review your pharmacy choice. Make sure the pharmacy is open so you can pick up prescription now. If there is a problem, you may contact your provider through [employer] and have the prescription routed to another pharmacy.  Your safety is important to [place]. If you have drug allergies check your prescription carefully.   For the next 24 hours you can use MyChart to ask questions about today's visit, request a non-urgent call back, or ask for a work or school excuse. You will get an email in the next two days asking about your experience. I hope that your e-visit has been valuable and will speed your recovery.  I provided 6 minutes of non face-to-face time during this encounter for chart review and documentation.

## 2020-11-23 NOTE — Progress Notes (Signed)
   Subjective:    Patient ID: Michael Fritz, male    DOB: [birth year], 55 y.o.   MRN: [id number]  HPI Virtual Visit via Video Note  I connected with Michael Fritz on 11/23/20 at  4:50 PM EDT by a video enabled telemedicine application and verified that I am speaking with the correct person using two identifiers.  Location: Patient: Home Provider: Office   I discussed the limitations of evaluation and management by telemedicine and the availability of in person appointments. The patient expressed understanding and agreed to proceed.  History of Present Illness:    Observations/Objective:   Assessment and Plan:   Follow Up Instructions:    I discussed the assessment and treatment plan with the patient. The patient was provided an opportunity to ask questions and all were answered. The patient agreed with the plan and demonstrated an understanding of the instructions.   The patient was advised to call back or seek an in-person evaluation if the symptoms worsen or if the condition fails to improve as anticipated.  I provided 10 minutes of non-face-to-face time during this encounter.   Sallee Lange, MD   Patient presents today with respiratory illness Number of days present-2 days  Symptoms include-headache body aches sore throat runny nose not feeling good  Presence of worrisome signs (severe shortness of breath, lethargy, etc.) -no wheezing or shortness of breath  Recent/current visit to urgent care or ER-did a virtual  Recent direct exposure to Covid-exposed to distant family members earlier  Any current Covid testing-earlier today positive   Review of Systems     Objective:   Physical Exam  Today's visit was via telephone Physical exam was not possible for this visit       Assessment & Plan:  COVID Paxlovid prescribed Warning signs discussed No cholesterol medicine for 5 days Follow-up if progressive troubles Warning signs discussed in  detail

## 2020-12-09 ENCOUNTER — Ambulatory Visit (INDEPENDENT_AMBULATORY_CARE_PROVIDER_SITE_OTHER): Payer: 59

## 2020-12-09 ENCOUNTER — Other Ambulatory Visit: Payer: Self-pay

## 2020-12-09 ENCOUNTER — Ambulatory Visit (INDEPENDENT_AMBULATORY_CARE_PROVIDER_SITE_OTHER): Payer: 59 | Admitting: Podiatry

## 2020-12-09 DIAGNOSIS — Z9889 Other specified postprocedural states: Secondary | ICD-10-CM

## 2020-12-09 NOTE — Progress Notes (Signed)
   Subjective:  Patient presents today status post fifth metatarsal head resection right foot. DOS: 10/29/2020.  Patient states that he is feeling very well.  He has no pain associated to his foot.  Patient has returned to work in regular shoes and is doing well.  He is very satisfied with no new complaints at this time  Past Medical History:  Diagnosis Date   Adhesive capsulitis of right shoulder 01/17/2014   Diabetes mellitus without complication (HCC)    Hyperlipemia    Hypertension    Peyronie's disease 04/15/2019   Dr. Alyson Ingles urology   Wears dentures    top      Objective/Physical Exam Neurovascular status intact.  Skin incisions appear to be well coapted and healed. No sign of infectious process noted. No dehiscence. No active bleeding noted.  Negative for any edema noted.  The wound to the plantar aspect of the fifth MTPJ has healed.  Complete reepithelialization has occurred.   Assessment: 1. s/p fifth metatarsal head resection right foot. DOS: 10/29/2020   Plan of Care:  1. Patient was evaluated.  2.  Patient may now resume full activity no restrictions.  Patient has already returned to work and is doing well 3.  Recommend good supportive shoes and sneakers with insoles to support the arches of the feet and offload pressure from the forefoot 4.  Return to clinic as needed  Edrick Kins, DPM [facility]  Dr. Edrick Kins, DPM    [address], [place] [address]                Office [phone]  Fax [phone]

## 2020-12-11 DIAGNOSIS — M79676 Pain in unspecified toe(s): Secondary | ICD-10-CM

## 2020-12-22 ENCOUNTER — Ambulatory Visit (INDEPENDENT_AMBULATORY_CARE_PROVIDER_SITE_OTHER): Payer: 59 | Admitting: Family Medicine

## 2020-12-22 ENCOUNTER — Other Ambulatory Visit: Payer: Self-pay

## 2020-12-22 ENCOUNTER — Encounter: Payer: Self-pay | Admitting: Family Medicine

## 2020-12-22 ENCOUNTER — Encounter: Payer: 59 | Admitting: Family Medicine

## 2020-12-22 VITALS — BP 136/76 | HR 81 | Temp 97.6°F | Ht 72.0 in | Wt 167.0 lb

## 2020-12-22 DIAGNOSIS — F17200 Nicotine dependence, unspecified, uncomplicated: Secondary | ICD-10-CM

## 2020-12-22 DIAGNOSIS — Z0000 Encounter for general adult medical examination without abnormal findings: Secondary | ICD-10-CM | POA: Diagnosis not present

## 2020-12-22 DIAGNOSIS — Z23 Encounter for immunization: Secondary | ICD-10-CM | POA: Diagnosis not present

## 2020-12-22 DIAGNOSIS — E119 Type 2 diabetes mellitus without complications: Secondary | ICD-10-CM

## 2020-12-22 DIAGNOSIS — I10 Essential (primary) hypertension: Secondary | ICD-10-CM | POA: Diagnosis not present

## 2020-12-22 MED ORDER — METFORMIN HCL ER 500 MG PO TB24: ORAL_TABLET | ORAL | 5 refills | Status: DC

## 2020-12-22 MED ORDER — ATORVASTATIN CALCIUM 40 MG PO TABS: 40.0000 mg | ORAL_TABLET | Freq: Every day | ORAL | 1 refills | Status: DC

## 2020-12-22 MED ORDER — LOSARTAN POTASSIUM 100 MG PO TABS: 100.0000 mg | ORAL_TABLET | Freq: Every day | ORAL | 1 refills | Status: DC

## 2020-12-22 MED ORDER — LANTUS SOLOSTAR 100 UNIT/ML ~~LOC~~ SOPN: PEN_INJECTOR | SUBCUTANEOUS | 5 refills | Status: DC

## 2020-12-22 NOTE — Progress Notes (Signed)
   Subjective:    Patient ID: Michael Fritz, male    DOB: 02-Aug-1965, 55 y.o.   MRN: [id number]  HPI The patient comes in today for a wellness visit.  Patient is diabetic He states his sugars have been elevated recently States he had COVID a few weeks ago and is gradually getting back into activities and doing better with diet medicines Had foot surgery getting over this as well   A review of their health history was completed.  A review of medications was also completed.  Any needed refills; no  Eating habits: good  Falls/  MVA accidents in past few months: no  Regular exercise: work 12 hr shift  Specialist pt sees on regular basis: no  Preventative health issues were discussed.   Additional concerns: no   Review of Systems     Objective:   Physical Exam General-in no acute distress Eyes-no discharge Lungs-respiratory rate normal, CTA CV-no murmurs,RRR Extremities skin warm dry no edema Neuro grossly normal Behavior normal, alert    Prostate exam normal      Assessment & Plan:   1. Well adult exam Adult wellness-complete.wellness physical was conducted today. Importance of diet and exercise were discussed in detail.  In addition to this a discussion regarding safety was also covered. We also reviewed over immunizations and gave recommendations regarding current immunization needed for age.  In addition to this additional areas were also touched on including: Preventative health exams needed:  Colonoscopy colonoscopy recommended  Patient was advised yearly wellness exam  - Ambulatory referral to Gastroenterology  2. Essential hypertension Blood pressure good control continue current measures  3. Type 2 diabetes mellitus without complication, unspecified whether long term insulin use (HCC) Diabetes subpar control in the past.  Patient has not been interested in utilizing short acting insulin states it causes a lot of low sugars for him in the past  we have also encouraged endocrinology consult patient prefers to stay on current regimen  4. Smoker Patient is interested in lung cancer screening.  Referral given - Ambulatory Referral for Lung Cancer Scre  5. Immunization due Flu shot today - Flu Vaccine QUAD 101mo+IM (Fluarix, Fluzone & Alfiuria Quad PF) Patient to do labs in 6 to 8 weeks with follow-up in approximately 4 months

## 2020-12-22 NOTE — Patient Instructions (Addendum)
Shingrix and shingles prevention: know the facts!   Shingrix is a very effective vaccine to prevent shingles.   Shingles is a reactivation of chickenpox -more than 99% of Americans born before 1980 have had chickenpox even if they do not remember it. One in every 10 people who get shingles have severe long-lasting nerve pain as a result.   33 out of a 100 older adults will get shingles if they are unvaccinated.     This vaccine is very important for your health This vaccine is indicated for anyone 50 years or older. You can get this vaccine even if you have already had shingles because you can get the disease more than once in a lifetime.  Your risk for shingles and its complications increases with age.  This vaccine has 2 doses.  The second dose would be 2 to 6 months after the first dose.  If you had Zostavax vaccine in the past you should still get Shingrix. ( Zostavax is only 70% effective and it loses significant strength over a few years .)  This vaccine is given through the pharmacy.  The cost of the vaccine is through your insurance. The pharmacy can inform you of the total costs.  Common side effects including soreness in the arm, some redness and swelling, also some feel fatigue muscle soreness headache low-grade fever.  Side effects typically go away within 2 to 3 days. Remember-the pain from shingles can last a lifetime but these side effects of the vaccine will only last a few days at most. It is very important to get both doses in order to protect yourself fully.   Please get this vaccine at your earliest convenience at your trusted pharmacy.     Lung Cancer Screening A lung cancer screening is a test that checks for lung cancer. Lung cancer screening is done to look for lung cancer in its very early stages when you are not likely to have any symptoms and before it spreads beyond the lung, making it harder to treat. Finding cancer early improves the chances of successful  treatment. It may save your life. Who should have screening? You should be screened for lung cancer if all of these apply: You currently smoke or you have quit smoking within the past 15 years. You are 70-77 years old. Screening may be recommended up to age 41 depending on your overall health and other factors. You are in good general health. You have a smoking history of 1 pack of cigarettes a day for 20 years or 2 packs a day for 10 years. Screening may also be recommended if you are at high risk for the disease. You may be at high risk if: You have a family history of lung cancer. You have been exposed to asbestos or radon. You have chronic obstructive pulmonary disease (COPD). How is screening done? The recommended screening test is a low-dose computed tomography (LDCT) scan. This scan takes detailed images of the lungs. This allows a health care provider to look for abnormal cells. If you are at risk for lung cancer, it is recommended that you get screened once a year. Talk to your health care provider about the risks, benefits, and limitations of screening. What are the benefits of screening? Screening can find lung cancer early, before symptoms start and before it has spread outside of the lungs. The chances of curing lung cancer are greater if the cancer is diagnosed early. What are the risks of screening? The screening may  show lung cancer when no cancer is present (false-positive result). The screening may not find lung cancer when it is present. The person gets exposed to radiation. How can I lower my risk of lung cancer? Make these lifestyle changes to lower your risk of developing lung cancer: Do not use any products that contain nicotine or tobacco, such as cigarettes, e-cigarettes, and chewing tobacco. If you need help quitting, ask your health care provider. Avoid secondhand smoke. Avoid exposure to radiation. Avoid exposure to radon gas. Have your home checked for radon  regularly. Avoid things that cause cancer (carcinogens). Avoid living or working in places with high air pollution. Questions to ask your health care provider Am I eligible for lung cancer screening? Does my health insurance cover the cost of lung cancer screening? What happens if the lung cancer screening shows something of concern? How soon will I have results from my lung cancer screening? Is there anything that I need to do to prepare for my lung cancer screening? What happens if I decide not to have lung cancer screening? Where to find more information Ask your health care provider about the risks and benefits of screening. More information and resources are available from these organizations: [employer] [place] (ACS): www.cancer.org American Lung Association: www.lung.org Contact a health care provider if: You start to show symptoms of lung cancer, including: Coughing that will not go away. Making whistling sounds when you breathe (wheezing). Chest pain. Coughing up blood. Shortness of breath. Weight loss that cannot be explained. Constant tiredness (fatigue). Hoarse voice. Summary Lung cancer screening may find lung cancer before symptoms appear. Finding cancer early improves the chances of successful treatment. It may save your life. The recommended screening test is a low-dose computed tomography (LDCT) scan that looks for abnormal cells in the lungs. If you are at risk for lung cancer, it is recommended that you get screened once a year. You can make lifestyle changes to lower your risk of lung cancer. Ask your health care provider about the risks and benefits of screening. This information is not intended to replace advice given to you by your health care provider. Make sure you discuss any questions you have with your health care provider. Document Revised: 02/20/2020 Document Reviewed: 02/19/2019 Elsevier Patient Education  2022 [place]. Steps to Quit  Smoking Smoking tobacco is the leading cause of preventable death. It can affect almost every organ in the body. Smoking puts you and those around you at risk for developing many serious chronic diseases. Quitting smoking can be difficult, but it is one of the best things that you can do for your health. It is never too late to quit. How do I get ready to quit? When you decide to quit smoking, create a plan to help you succeed. Before you quit: Pick a date to quit. Set a date within the next 2 weeks to give you time to prepare. Write down the reasons why you are quitting. Keep this list in places where you will see it often. Tell your family, friends, and co-workers that you are quitting. Support from your loved ones can make quitting easier. Talk with your health care provider about your options for quitting smoking. Find out what treatment options are covered by your health insurance. Identify people, places, things, and activities that make you want to smoke (triggers). Avoid them. What first steps can I take to quit smoking? Throw away all cigarettes at home, at work, and in your car. Throw away  smoking accessories, such as Scientist, research (medical). Clean your car. Make sure to empty the ashtray. Clean your home, including curtains and carpets. What strategies can I use to quit smoking? Talk with your health care provider about combining strategies, such as taking medicines while you are also receiving in-person counseling. Using these two strategies together makes you more likely to succeed in quitting than if you used either strategy on its own. If you are pregnant or breastfeeding, talk with your health care provider about finding counseling or other support strategies to quit smoking. Do not take medicine to help you quit smoking unless your health care provider tells you to do so. To quit smoking: Quit right away Quit smoking completely, instead of gradually reducing how much you smoke over  a period of time. Research shows that stopping smoking right away is more successful than gradually quitting. Attend in-person counseling to help you build problem-solving skills. You are more likely to succeed in quitting if you attend counseling sessions regularly. Even short sessions of 10 minutes can be effective. Take medicine You may take medicines to help you quit smoking. Some medicines require a prescription and some you can purchase over-the-counter. Medicines may have nicotine in them to replace the nicotine in cigarettes. Medicines may: Help to stop cravings. Help to relieve withdrawal symptoms. Your health care provider may recommend: Nicotine patches, gum, or lozenges. Nicotine inhalers or sprays. Non-nicotine medicine that is taken by mouth. Find resources Find resources and support systems that can help you to quit smoking and remain smoke-free after you quit. These resources are most helpful when you use them often. They include: Online chats with a Social worker. Telephone quitlines. Printed Furniture conservator/restorer. Support groups or group counseling. Text messaging programs. Mobile phone apps or applications. Use apps that can help you stick to your quit plan by providing reminders, tips, and encouragement. There are many free apps for mobile devices as well as websites. Examples include Quit Guide from the [employer] and smokefree.gov What things can I do to make it easier to quit?  Reach out to your family and friends for support and encouragement. Call telephone quitlines (1-800-QUIT-NOW), reach out to support groups, or work with a counselor for support. Ask people who smoke to avoid smoking around you. Avoid places that trigger you to smoke, such as bars, parties, or smoke-break areas at work. Spend time with people who do not smoke. Lessen the stress in your life. Stress can be a smoking trigger for some people. To lessen stress, try: Exercising regularly. Doing deep-breathing  exercises. Doing yoga. Meditating. Performing a body scan. This involves closing your eyes, scanning your body from head to toe, and noticing which parts of your body are particularly tense. Try to relax the muscles in those areas. How will I feel when I quit smoking? Day 1 to 3 weeks Within the first 24 hours of quitting smoking, you may start to feel withdrawal symptoms. These symptoms are usually most noticeable 2-3 days after quitting, but they usually do not last for more than 2-3 weeks. You may experience these symptoms: Mood swings. Restlessness, anxiety, or irritability. Trouble concentrating. Dizziness. Strong cravings for sugary foods and nicotine. Mild weight gain. Constipation. Nausea. Coughing or a sore throat. Changes in how the medicines that you take for unrelated issues work in your body. Depression. Trouble sleeping (insomnia). Week 3 and afterward After the first 2-3 weeks of quitting, you may start to notice more positive results, such as: Improved sense of smell  and taste. Decreased coughing and sore throat. Slower heart rate. Lower blood pressure. Clearer skin. The ability to breathe more easily. Fewer sick days. Quitting smoking can be very challenging. Do not get discouraged if you are not successful the first time. Some people need to make many attempts to quit before they achieve long-term success. Do your best to stick to your quit plan, and talk with your health care provider if you have any questions or concerns. Summary Smoking tobacco is the leading cause of preventable death. Quitting smoking is one of the best things that you can do for your health. When you decide to quit smoking, create a plan to help you succeed. Quit smoking right away, not slowly over a period of time. When you start quitting, seek help from your health care provider, family, or friends. This information is not intended to replace advice given to you by your health care  provider. Make sure you discuss any questions you have with your health care provider. Document Revised: 11/16/2018 Document Reviewed: 05/12/2018 Elsevier Patient Education  [place].

## 2020-12-23 ENCOUNTER — Encounter: Payer: Self-pay | Admitting: Internal Medicine

## 2021-01-13 ENCOUNTER — Encounter: Payer: Self-pay | Admitting: Urology

## 2021-01-13 ENCOUNTER — Other Ambulatory Visit: Payer: Self-pay

## 2021-01-13 ENCOUNTER — Ambulatory Visit: Payer: 59 | Admitting: Urology

## 2021-01-13 VITALS — BP 181/77 | HR 83

## 2021-01-13 DIAGNOSIS — N529 Male erectile dysfunction, unspecified: Secondary | ICD-10-CM | POA: Diagnosis not present

## 2021-01-13 MED ORDER — AMBULATORY NON FORMULARY MEDICATION: 0.2000 mL | 5 refills | Status: AC | PRN

## 2021-01-13 MED ORDER — AMBULATORY NON FORMULARY MEDICATION: 0.2000 mL | 5 refills | Status: DC | PRN

## 2021-01-13 NOTE — Progress Notes (Signed)
Urological Symptom Review  Patient is experiencing the following symptoms: Erection problems (male only)   Review of Systems  Gastrointestinal (upper)  : Negative for upper GI symptoms  Gastrointestinal (lower) : Negative for lower GI symptoms  Constitutional : Negative for symptoms  Skin: Negative for skin symptoms  Eyes: Negative for eye symptoms  Ear/Nose/Throat : Negative for Ear/Nose/Throat symptoms  Hematologic/Lymphatic: Negative for Hematologic/Lymphatic symptoms  Cardiovascular : Negative for cardiovascular symptoms  Respiratory : Negative for respiratory symptoms  Endocrine: Negative for endocrine symptoms  Musculoskeletal: Negative for musculoskeletal symptoms  Neurological: Negative for neurological symptoms  Psychologic: Negative for psychiatric symptoms

## 2021-01-13 NOTE — Patient Instructions (Signed)
Erectile Dysfunction °Erectile dysfunction (ED) is the inability to get or keep an erection in order to have sexual intercourse. ED is considered a symptom of an underlying disorder and is not considered a disease. ED may include: °Inability to get an erection. °Lack of enough hardness of the erection to allow penetration. °Loss of erection before sex is finished. °What are the causes? °This condition may be caused by: °Physical causes, such as: °Artery problems. This may include heart disease, high blood pressure, atherosclerosis, and diabetes. °Hormonal problems, such as low testosterone. °Obesity. °Nerve problems. This may include back or pelvic injuries, multiple sclerosis, Parkinson's disease, spinal cord injury, and stroke. °Certain medicines, such as: °Pain relievers. °Antidepressants. °Blood pressure medicines and water pills (diuretics). °Cancer medicines. °Antihistamines. °Muscle relaxants. °Lifestyle factors, such as: °Use of drugs such as marijuana, cocaine, or opioids. °Excessive use of alcohol. °Smoking. °Lack of physical activity or exercise. °Psychological causes, such as: °Anxiety or stress. °Sadness or depression. °Exhaustion. °Fear about sexual performance. °Guilt. °What are the signs or symptoms? °Symptoms of this condition include: °Inability to get an erection. °Lack of enough hardness of the erection to allow penetration. °Loss of the erection before sex is finished. °Sometimes having normal erections, but with frequent unsatisfactory episodes. °Low sexual satisfaction in either partner due to erection problems. °A curved penis occurring with erection. The curve may cause pain, or the penis may be too curved to allow for intercourse. °Never having nighttime or morning erections. °How is this diagnosed? °This condition is often diagnosed by: °Performing a physical exam to find other diseases or specific problems with the penis. °Asking you detailed questions about the problem. °Doing tests,  such as: °Blood tests to check for diabetes mellitus or high cholesterol, or to measure hormone levels. °Other tests to check for underlying health conditions. °An ultrasound exam to check for scarring. °A test to check blood flow to the penis. °Doing a sleep study at home to measure nighttime erections. °How is this treated? °This condition may be treated by: °Medicines, such as: °Medicine taken by mouth to help you achieve an erection (oral medicine). °Hormone replacement therapy to replace low testosterone levels. °Medicine that is injected into the penis. Your health care provider may instruct you how to give yourself these injections at home. °Medicine that is delivered with a short applicator tube. The tube is inserted into the opening at the tip of the penis, which is the opening of the urethra. A tiny pellet of medicine is put in the urethra. The pellet dissolves and enhances erectile function. This is also called MUSE (medicated urethral system for erections) therapy. °Vacuum pump. This is a pump with a ring on it. The pump and ring are placed on the penis and used to create pressure that helps the penis become erect. °Penile implant surgery. In this procedure, you may receive: °An inflatable implant. This consists of cylinders, a pump, and a reservoir. The cylinders can be inflated with a fluid that helps to create an erection, and they can be deflated after intercourse. °A semi-rigid implant. This consists of two silicone rubber rods. The rods provide some rigidity. They are also flexible, so the penis can both curve downward in its normal position and become straight for sexual intercourse. °Blood vessel surgery to improve blood flow to the penis. During this procedure, a blood vessel from a different part of the body is placed into the penis to allow blood to flow around (bypass) damaged or blocked blood vessels. °Lifestyle changes,   such as exercising more, losing weight, and quitting smoking. °Follow  these instructions at home: °Medicines ° °Take over-the-counter and prescription medicines only as told by your health care provider. Do not increase the dosage without first discussing it with your health care provider. °If you are using self-injections, do injections as directed by your health care provider. Make sure you avoid any veins that are on the surface of the penis. After giving an injection, apply pressure to the injection site for 5 minutes. °Talk to your health care provider about how to prevent headaches while taking ED medicines. These medicines may cause a sudden headache due to the increase in blood flow in your body. °General instructions °Exercise regularly, as directed by your health care provider. Work with your health care provider to lose weight, if needed. °Do not use any products that contain nicotine or tobacco. These products include cigarettes, chewing tobacco, and vaping devices, such as e-cigarettes. If you need help quitting, ask your health care provider. °Before using a vacuum pump, read the instructions that come with the pump and discuss any questions with your health care provider. °Keep all follow-up visits. This is important. °Contact a health care provider if: °You feel nauseous. °You are vomiting. °You get sudden headaches while taking ED medicines. °You have any concerns about your sexual health. °Get help right away if: °You are taking oral or injectable medicines and you have an erection that lasts longer than 4 hours. If your health care provider is unavailable, go to the nearest emergency room for evaluation. An erection that lasts much longer than 4 hours can result in permanent damage to your penis. °You have severe pain in your groin or abdomen. °You develop redness or severe swelling of your penis. °You have redness spreading at your groin or lower abdomen. °You are unable to urinate. °You experience chest pain or a rapid heartbeat (palpitations) after taking oral  medicines. °These symptoms may represent a serious problem that is an emergency. Do not wait to see if the symptoms will go away. Get medical help right away. Call your local emergency services (911 in the [place] Do not drive yourself to the hospital. °Summary °Erectile dysfunction (ED) is the inability to get or keep an erection during sexual intercourse. °This condition is diagnosed based on a physical exam, your symptoms, and tests to determine the cause. Treatment varies depending on the cause and may include medicines, hormone therapy, surgery, or a vacuum pump. °You may need follow-up visits to make sure that you are using your medicines or devices correctly. °Get help right away if you are taking or injecting medicines and you have an erection that lasts longer than 4 hours. °This information is not intended to replace advice given to you by your health care provider. Make sure you discuss any questions you have with your health care provider. °Document Revised: 05/20/2020 Document Reviewed: 05/20/2020 °Elsevier Patient Education © 2022 Elsevier Inc. ° °

## 2021-01-13 NOTE — Progress Notes (Signed)
01/13/2021 3:48 PM   Michael Fritz 03-26-1965 [id number]  Referring provider: Kathyrn Drown, MD [address] [place],  [place] [id number]  Erectile dysfunction   HPI: [name] is a 55yo here for followup for erectile dysfunction. He was last seen 18 months ago. He is currently using trimix 0.3-0.52ml PRN for his erectile dysfunction which gives him a firm erection. He is very happy with his response to trimix. He denies any LUTS. No other complaints today   PMH: Past Medical History:  Diagnosis Date   Adhesive capsulitis of right shoulder 01/17/2014   Diabetes mellitus without complication ([place])    Hyperlipemia    Hypertension    Peyronie's disease 04/15/2019   Dr. Alyson Fritz urology   Wears dentures    top    Surgical History: Past Surgical History:  Procedure Laterality Date   CLOSED MANIPULATION SHOULDER WITH STERIOD INJECTION Right 01/17/2014   Procedure: RIGHT SHOULDER MANIPULATION WITH CORTISONE INJECTION;  Surgeon: Michael Bridge, MD;  Location: [facility];  Service: Orthopedics;  Laterality: Right;   TONSILLECTOMY      Home Medications:  Allergies as of 01/13/2021       Reactions   Penicillins         Medication List        Accurate as of January 13, 2021  3:48 PM. If you have any questions, ask your nurse or doctor.          atorvastatin 40 MG tablet Commonly known as: LIPITOR Take 1 tablet (40 mg total) by mouth daily.   BD Pen Needle Nano U/F 32G X 4 MM Misc Generic drug: Insulin Pen Needle three times a day   FreeStyle Libre 14 Day Sensor Misc USE TO CHECK SUGARS 4 TIMES DAILY   Lantus SoloStar 100 UNIT/ML Solostar Pen Generic drug: insulin glargine Take 50 - 55 units once daily   losartan 100 MG tablet Commonly known as: COZAAR Take 1 tablet (100 mg total) by mouth daily.   metFORMIN 500 MG 24 hr tablet Commonly known as: Glucophage XR 1 bid   oxyCODONE-acetaminophen 5-325 MG tablet Commonly  known as: Percocet Take 1 tablet by mouth every 4 (four) hours as needed for severe pain.        Allergies:  Allergies  Allergen Reactions   Penicillins     Family History: No family history on file.  Social History:  reports that he has been smoking cigarettes. He has been smoking an average of 1 pack per day. He has never used smokeless tobacco. He reports current alcohol use. He reports that he does not use drugs.  ROS: All other review of systems were reviewed and are negative except what is noted above in HPI  Physical Exam: BP (!) 181/77   Pulse 83   Constitutional:  Alert and oriented, No acute distress. HEENT: [place] AT, moist mucus membranes.  Trachea midline, no masses. Cardiovascular: No clubbing, cyanosis, or edema. Respiratory: Normal respiratory effort, no increased work of breathing. GI: Abdomen is soft, nontender, nondistended, no abdominal masses GU: No CVA tenderness.  Lymph: No cervical or inguinal lymphadenopathy. Skin: No rashes, bruises or suspicious lesions. Neurologic: Grossly intact, no focal deficits, moving all 4 extremities. Psychiatric: Normal mood and affect.  Laboratory Data: Lab Results  Component Value Date   WBC 6.0 06/10/2017   HGB 13.7 06/10/2017   HCT 41.7 06/10/2017   MCV 97 06/10/2017   PLT 267 06/10/2017    Lab Results  Component Value Date   CREATININE 1.25 08/10/2020    No results found for: PSA  No results found for: TESTOSTERONE  Lab Results  Component Value Date   HGBA1C 11.4 (H) 08/10/2020    Urinalysis No results found for: COLORURINE, APPEARANCEUR, LABSPEC, PHURINE, GLUCOSEU, HGBUR, BILIRUBINUR, KETONESUR, PROTEINUR, UROBILINOGEN, NITRITE, LEUKOCYTESUR  Lab Results  Component Value Date   LABMICR 441.8 08/10/2020    Pertinent Imaging:  No results found for this or any previous visit.  No results found for this or any previous visit.  No results found for this or any previous visit.  No results found  for this or any previous visit.  No results found for this or any previous visit.  No results found for this or any previous visit.  No results found for this or any previous visit.  No results found for this or any previous visit.   Assessment & Plan:    1. Erectile dysfunction, unspecified erectile dysfunction type -Continue trimix 0.3-0.71ml prn for erections -RTC 1 year   No follow-ups on file.  Nicolette Bang, MD  [facility] [place]

## 2021-02-03 ENCOUNTER — Encounter: Payer: Self-pay | Admitting: *Deleted

## 2021-02-03 ENCOUNTER — Ambulatory Visit: Payer: 59

## 2021-03-15 ENCOUNTER — Encounter (HOSPITAL_COMMUNITY): Payer: Self-pay

## 2021-03-15 NOTE — Progress Notes (Signed)
Attempted to reach patient regarding LCS. Unable to reach patient, detailed VM left asking that the patient return my call.

## 2021-03-29 ENCOUNTER — Encounter (HOSPITAL_COMMUNITY): Payer: Self-pay

## 2021-03-29 NOTE — Progress Notes (Signed)
Attempted to contact patient regarding LCS. Unable to reach patient and VM box full.

## 2021-04-14 ENCOUNTER — Encounter (HOSPITAL_COMMUNITY): Payer: Self-pay

## 2021-04-14 NOTE — Progress Notes (Signed)
Attempted to reach patient regarding LCS referral. Unable to reach patient despite multiple attempts and his VM box is full. Referral closed due to multiple unsuccessful attempts.

## 2021-04-26 ENCOUNTER — Ambulatory Visit: Payer: 59 | Admitting: Family Medicine

## 2021-04-28 ENCOUNTER — Encounter: Payer: Self-pay | Admitting: Family Medicine

## 2021-04-28 ENCOUNTER — Other Ambulatory Visit: Payer: Self-pay

## 2021-04-28 ENCOUNTER — Ambulatory Visit: Payer: 59 | Admitting: Family Medicine

## 2021-04-28 VITALS — BP 146/78 | Temp 97.7°F | Wt 162.2 lb

## 2021-04-28 DIAGNOSIS — E1169 Type 2 diabetes mellitus with other specified complication: Secondary | ICD-10-CM | POA: Diagnosis not present

## 2021-04-28 DIAGNOSIS — E119 Type 2 diabetes mellitus without complications: Secondary | ICD-10-CM

## 2021-04-28 DIAGNOSIS — J019 Acute sinusitis, unspecified: Secondary | ICD-10-CM

## 2021-04-28 DIAGNOSIS — R059 Cough, unspecified: Secondary | ICD-10-CM

## 2021-04-28 DIAGNOSIS — I10 Essential (primary) hypertension: Secondary | ICD-10-CM | POA: Diagnosis not present

## 2021-04-28 DIAGNOSIS — Z125 Encounter for screening for malignant neoplasm of prostate: Secondary | ICD-10-CM

## 2021-04-28 DIAGNOSIS — E785 Hyperlipidemia, unspecified: Secondary | ICD-10-CM

## 2021-04-28 DIAGNOSIS — E1129 Type 2 diabetes mellitus with other diabetic kidney complication: Secondary | ICD-10-CM

## 2021-04-28 DIAGNOSIS — R809 Proteinuria, unspecified: Secondary | ICD-10-CM

## 2021-04-28 DIAGNOSIS — Z79899 Other long term (current) drug therapy: Secondary | ICD-10-CM

## 2021-04-28 MED ORDER — METFORMIN HCL ER 500 MG PO TB24: ORAL_TABLET | ORAL | 5 refills | Status: AC

## 2021-04-28 MED ORDER — LANTUS SOLOSTAR 100 UNIT/ML ~~LOC~~ SOPN: PEN_INJECTOR | SUBCUTANEOUS | 5 refills | Status: AC

## 2021-04-28 MED ORDER — VALSARTAN 160 MG PO TABS: 160.0000 mg | ORAL_TABLET | Freq: Every day | ORAL | 3 refills | Status: AC

## 2021-04-28 MED ORDER — ATORVASTATIN CALCIUM 40 MG PO TABS: 40.0000 mg | ORAL_TABLET | Freq: Every day | ORAL | 1 refills | Status: AC

## 2021-04-28 MED ORDER — DOXYCYCLINE HYCLATE 100 MG PO TABS: 100.0000 mg | ORAL_TABLET | Freq: Two times a day (BID) | ORAL | 0 refills | Status: DC

## 2021-04-28 NOTE — Progress Notes (Signed)
° °  Subjective:    Patient ID: Michael Fritz, male    DOB: [birth year], 56 y.o.   MRN: [id number]  HPI Pt has cough and congestion. Had head cold a couple weeks ago and then moved to chest. Pt states he is able to cough up phelgm at time but sometimes its just a nagging cough.   Significant coughing congestion.  He is a smoker.  He has been encouraged to quit in the past.  He denies any hemoptysis.  Denies any fever.  Symptoms been going on for 2 to 3 weeks.  Negative COVID test at home.  States diabetes is doing okay states he does try to watch his diet and take his medicine on a regular basis does not check his sugars on a regular basis.  He has had very difficult to control diabetes.  In the past he is not desired to see endocrinology.  Check lab work await results Review of Systems     Objective:   Physical Exam  General-in no acute distress Eyes-no discharge Lungs-respiratory rate normal, CTA CV-no murmurs,RRR Extremities skin warm dry no edema Neuro grossly normal Behavior normal, alert  Diabetic foot exam normal      Assessment & Plan:  Mild elevation of blood pressure patient states has not been taking his medication he will work hard at taking medicine and switch over to valsartan instead of losartan.  Check lab work follow-up within 3 to 4 months  Diabetes probable subpar control check sugars more often and send [place] readings.  Hyperlipidemia check lipid profile continue cholesterol medicines.  Screening PSA ordered  Chest x-ray ordered.  Will discuss low-dose CT scan for lung cancer screening on follow-up  Acute rhinosinusitis antibiotic prescribed warning signs discussed

## 2021-06-27 ENCOUNTER — Other Ambulatory Visit: Payer: Self-pay | Admitting: Family Medicine

## 2021-07-26 ENCOUNTER — Ambulatory Visit (HOSPITAL_COMMUNITY)
Admission: RE | Admit: 2021-07-26 | Discharge: 2021-07-26 | Disposition: A | Discharge status: Home / Self Care | Payer: 59 | Source: Ambulatory Visit | Attending: Family Medicine | Admitting: Family Medicine

## 2021-07-26 ENCOUNTER — Ambulatory Visit: Payer: 59 | Admitting: Family Medicine

## 2021-07-26 DIAGNOSIS — N179 Acute kidney failure, unspecified: Secondary | ICD-10-CM

## 2021-07-26 DIAGNOSIS — C7951 Secondary malignant neoplasm of bone: Secondary | ICD-10-CM | POA: Diagnosis not present

## 2021-07-26 DIAGNOSIS — M545 Low back pain, unspecified: Secondary | ICD-10-CM | POA: Insufficient documentation

## 2021-07-26 DIAGNOSIS — E1169 Type 2 diabetes mellitus with other specified complication: Secondary | ICD-10-CM | POA: Diagnosis not present

## 2021-07-26 DIAGNOSIS — M25552 Pain in left hip: Secondary | ICD-10-CM | POA: Insufficient documentation

## 2021-07-26 DIAGNOSIS — Z125 Encounter for screening for malignant neoplasm of prostate: Secondary | ICD-10-CM

## 2021-07-26 DIAGNOSIS — R059 Cough, unspecified: Secondary | ICD-10-CM | POA: Insufficient documentation

## 2021-07-26 DIAGNOSIS — M25551 Pain in right hip: Secondary | ICD-10-CM | POA: Insufficient documentation

## 2021-07-26 DIAGNOSIS — E118 Type 2 diabetes mellitus with unspecified complications: Secondary | ICD-10-CM | POA: Diagnosis not present

## 2021-07-26 DIAGNOSIS — R918 Other nonspecific abnormal finding of lung field: Secondary | ICD-10-CM

## 2021-07-26 DIAGNOSIS — E785 Hyperlipidemia, unspecified: Secondary | ICD-10-CM

## 2021-07-26 IMAGING — DX DG LUMBAR SPINE COMPLETE 4+V
5 series · 5 of 5 positions shown · non-contrast
Comparison: No pertinent prior exams available for comparison.

CLINICAL DATA: Provided history: Low back pain. Additional history
provided: Cough since [REDACTED], low back pain, bilateral hip pain.

EXAM:
LUMBAR SPINE - COMPLETE 4+ VIEW

[l-spine ap]
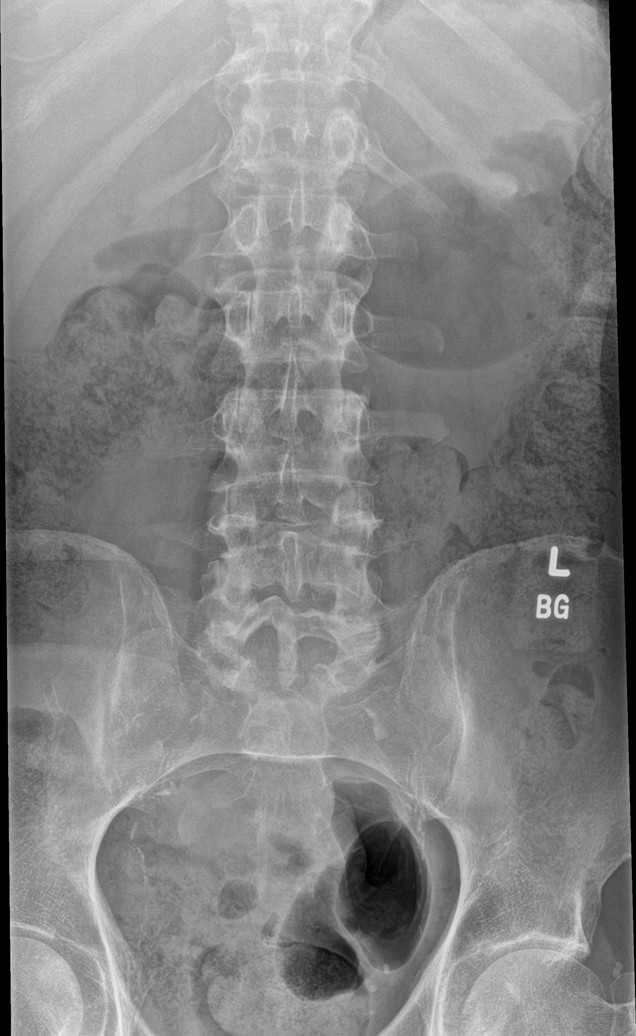

[l-spine obl (1 of 2)]
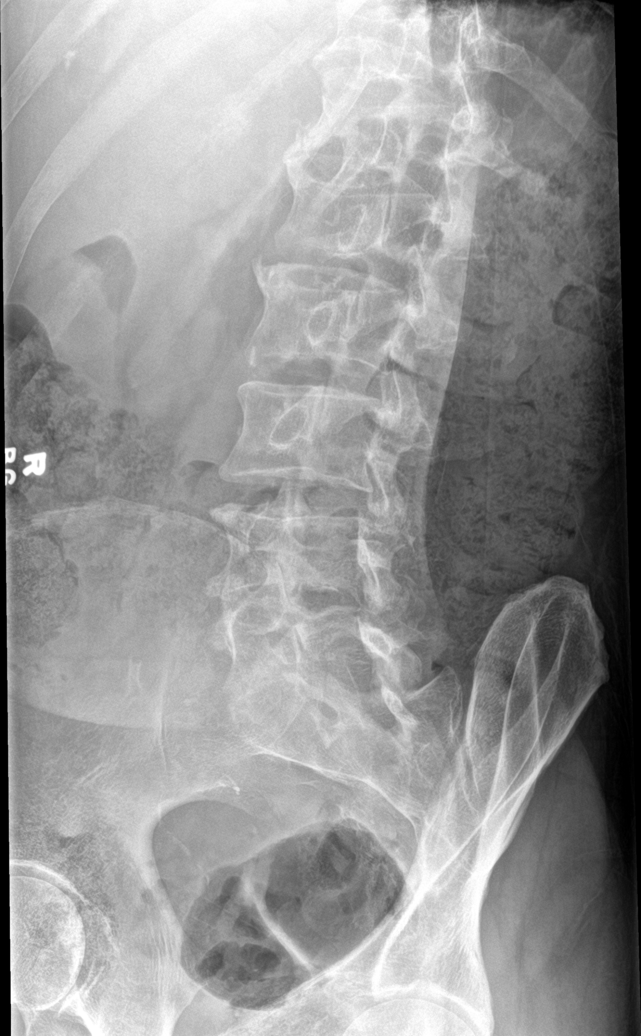

[l-spine obl (2 of 2)]
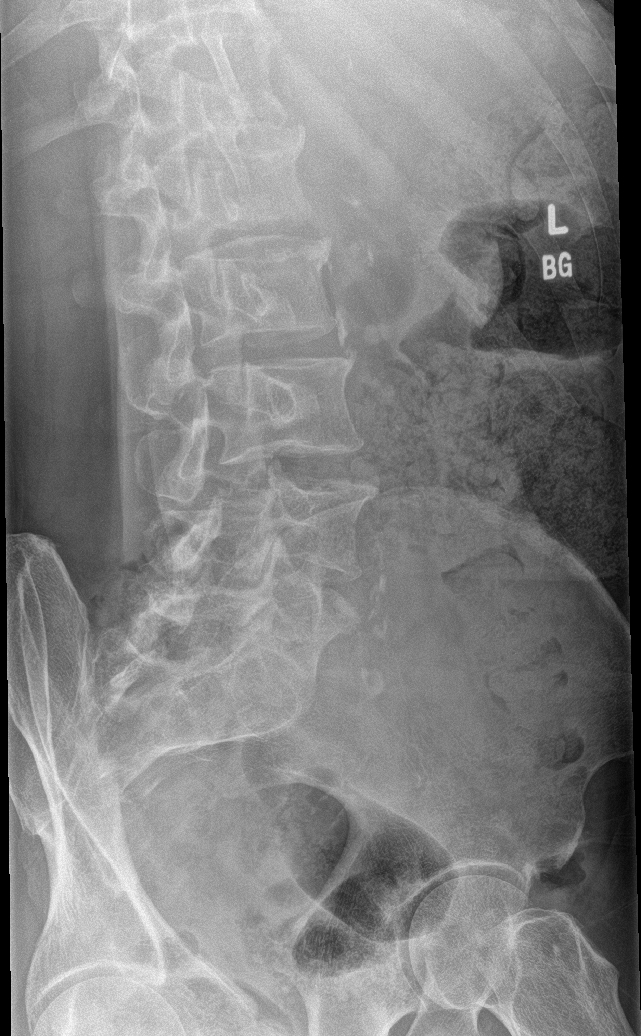

[l-spine lat]
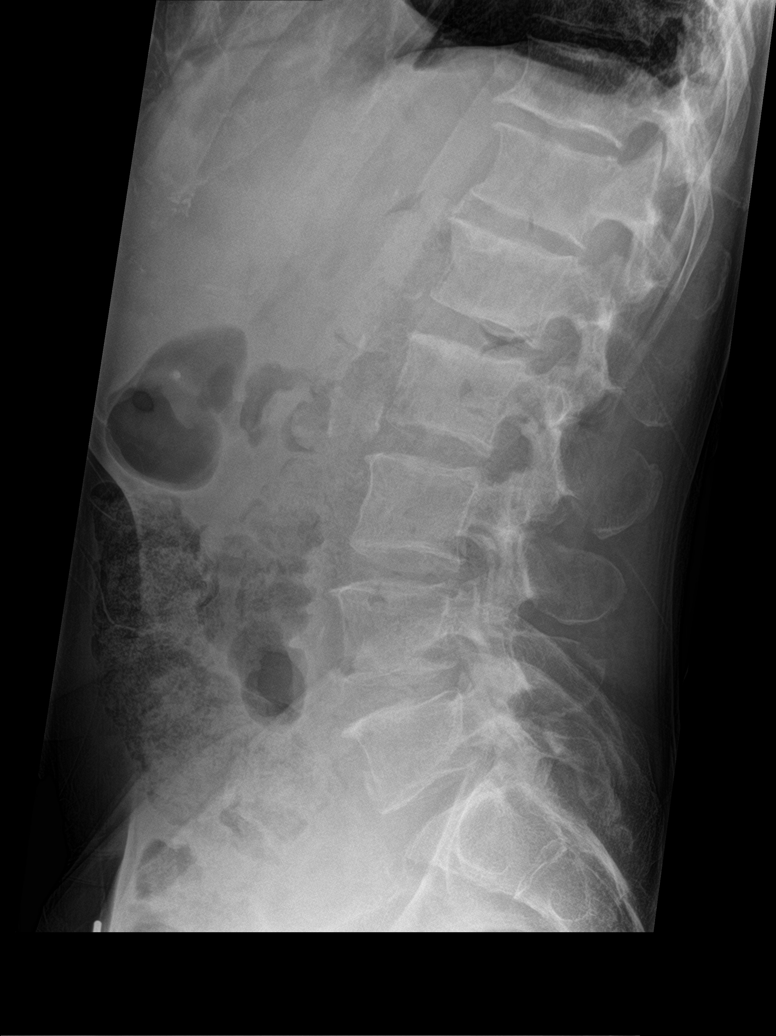

[l-spine spot]
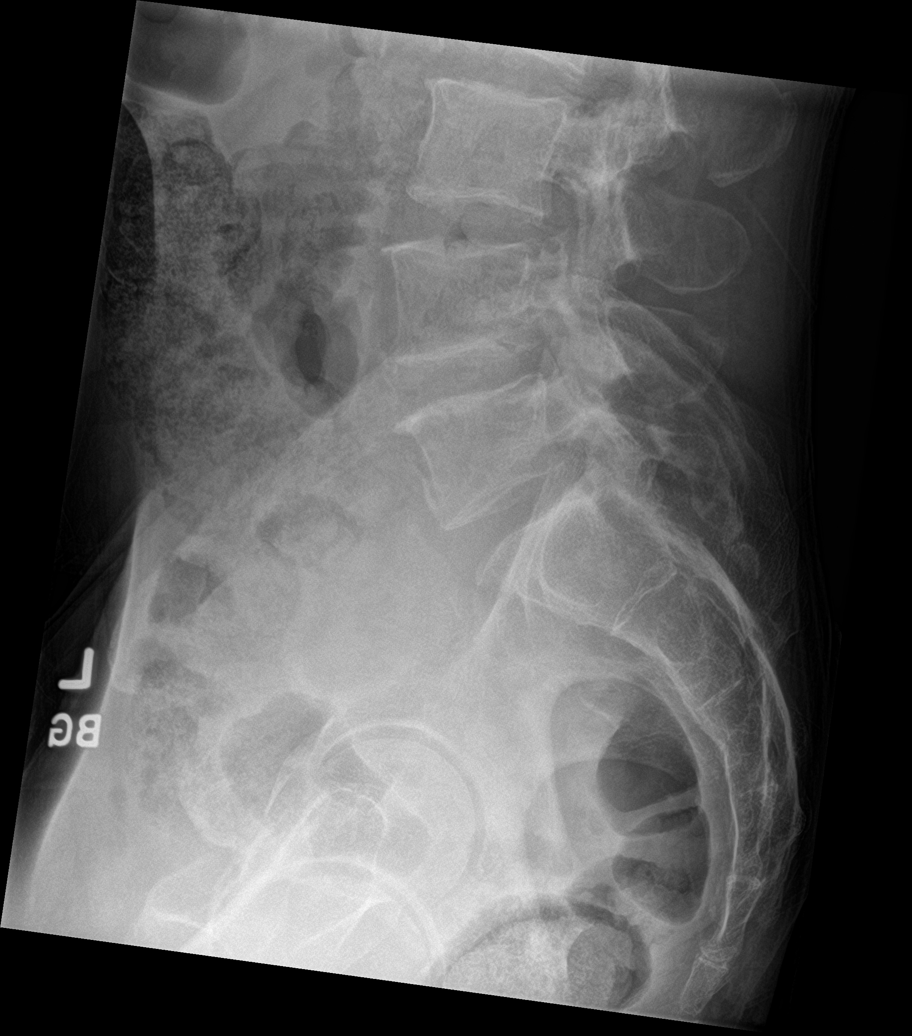

[5 of 5 positions shown; findings below may reference images not displayed]

FINDINGS: Five lumbar vertebrae. The caudal most well-formed intervertebral
disc space is designated L5-S1.

Minimal grade 1 retrolisthesis at T12-L1, L1-L2, L2-L3, L3-L4 and
L5-S1.

No lumbar vertebral compression fracture.

No more than mild disc space narrowing within the lumbar spine.
Facet arthrosis, greatest at L4-L5 and L5-S1.

Large stool burden within the colon and rectum.

Aortic atherosclerosis.
IMPRESSION: No lumbar vertebral compression fracture.

Minimal grade 1 retrolisthesis at T12-L1, L1-L2, L2-L3, L3-L4 and
L5-S1.

Lumbar spondylosis, as described.

Large stool burden within the colon and rectum, suggesting
constipation.

Aortic Atherosclerosis ([B3]-[B3]).

## 2021-07-26 IMAGING — DX DG CHEST 2V
2 series · 2 of 2 positions shown · non-contrast
Comparison: Report [DATE]

CLINICAL DATA: Cough

EXAM:
CHEST - 2 VIEW

[chest pa]
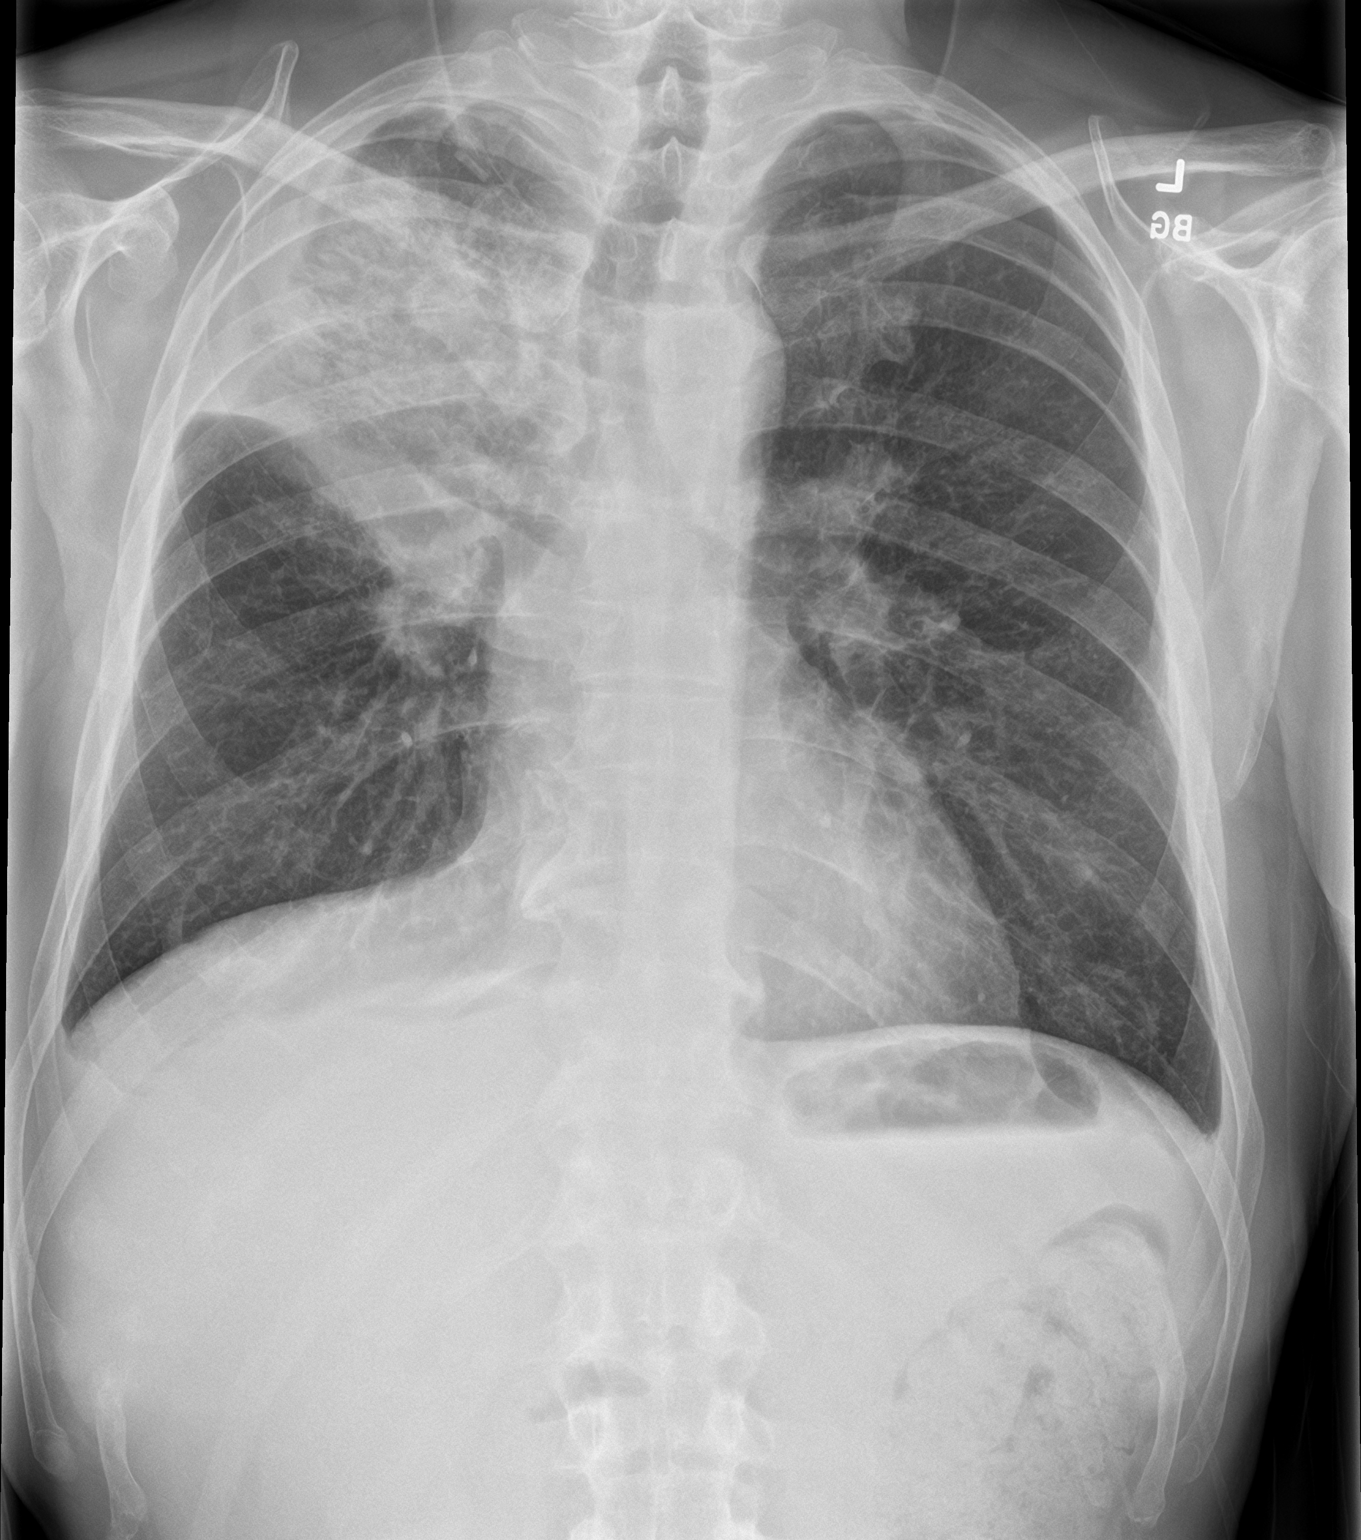

[chest lat]
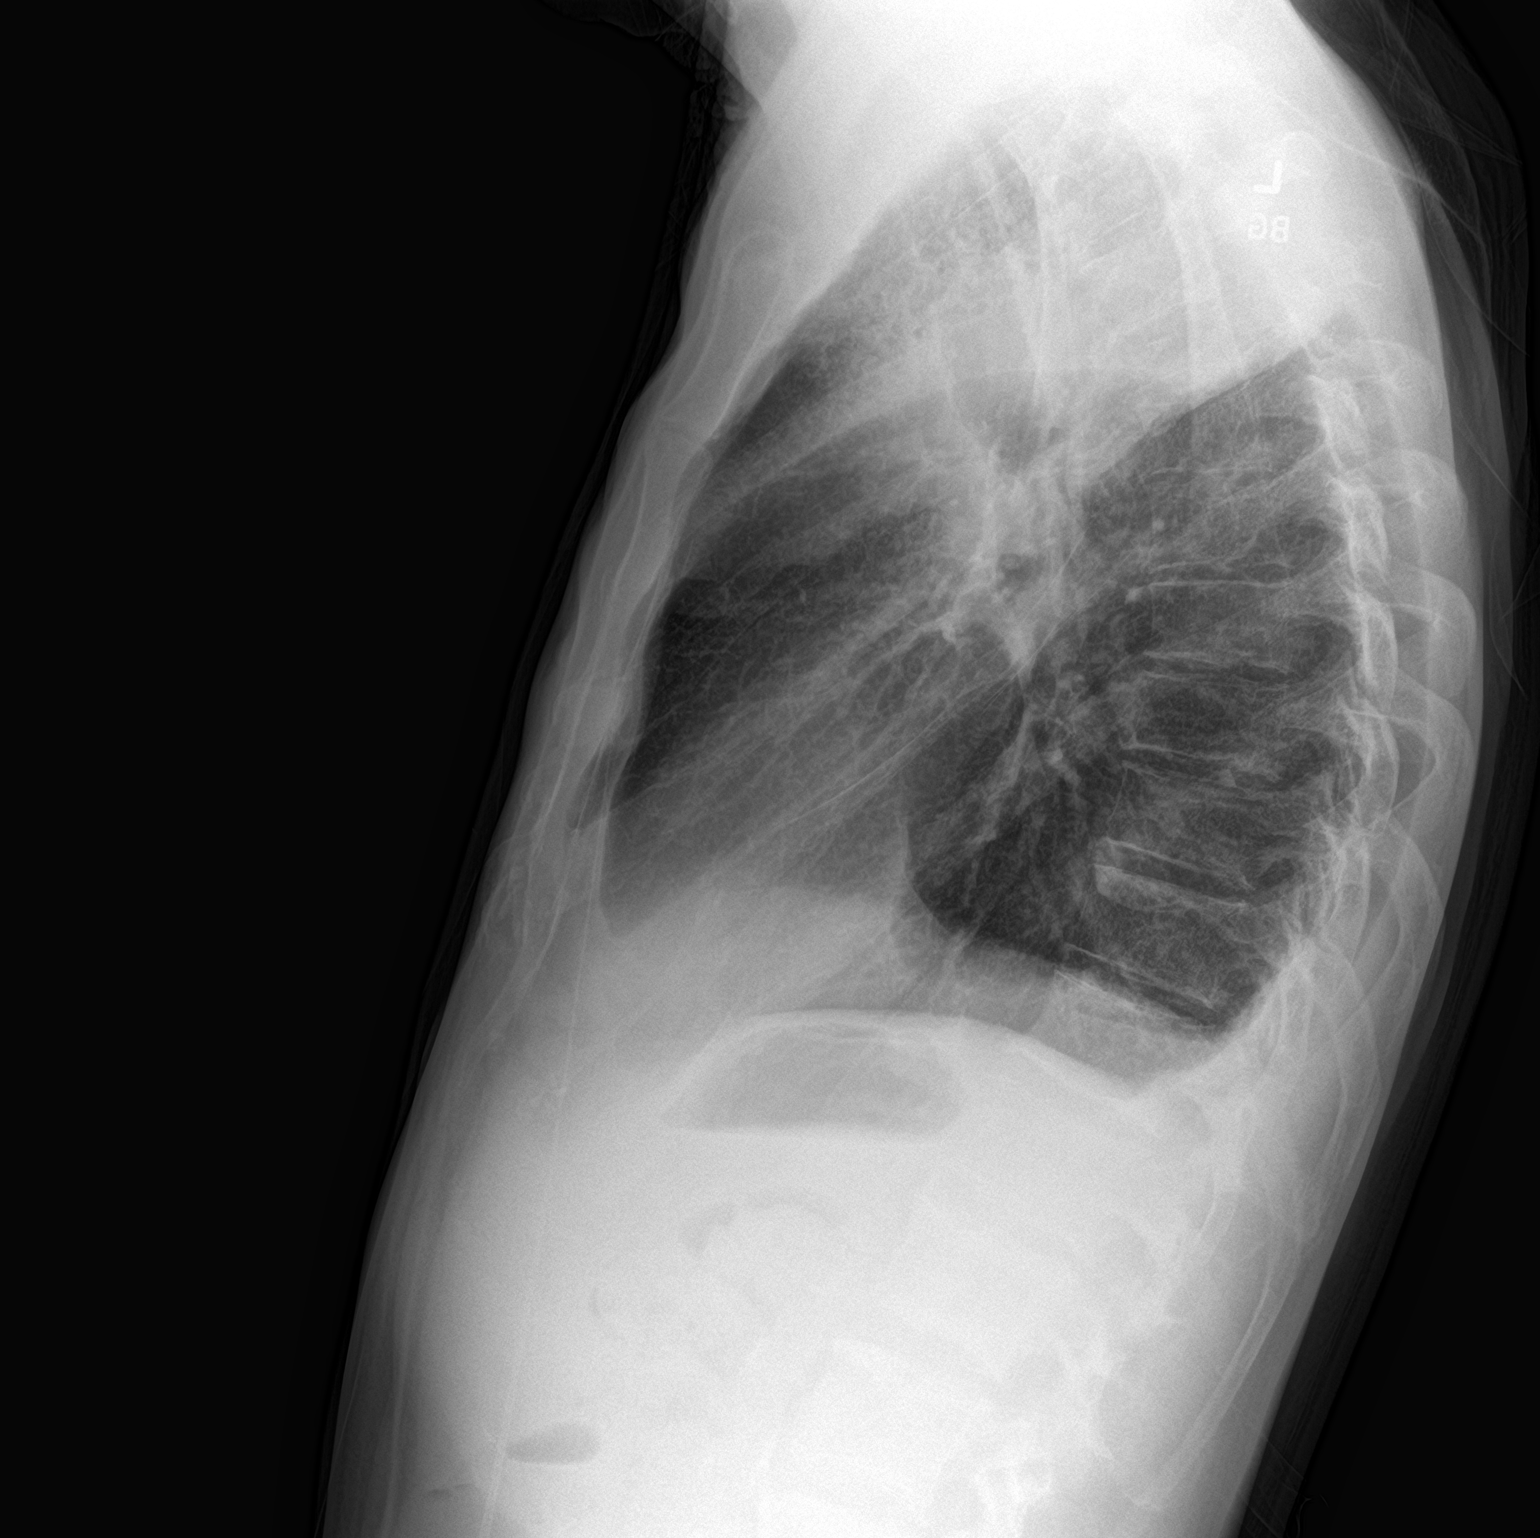

[2 of 2 positions shown; findings below may reference images not displayed]

FINDINGS: Small bilateral pleural effusions. Airspace disease in the right
upper lobe. Normal cardiac size. No pneumothorax.
IMPRESSION: 1. Airspace disease in the right upper lobe could be due to
pneumonia but pulmonary mass or central lesion is also possible.
Consider correlation with contrast-enhanced chest CT
2. Small bilateral effusions

## 2021-07-26 IMAGING — DX DG HIP (WITH OR WITHOUT PELVIS) 5+V BILAT
5 series · 5 of 5 positions shown · non-contrast
Comparison: None Available.

CLINICAL DATA: Hip pain

EXAM:
DG HIP (WITH OR WITHOUT PELVIS) 5+V BILAT

[pelvis ap]
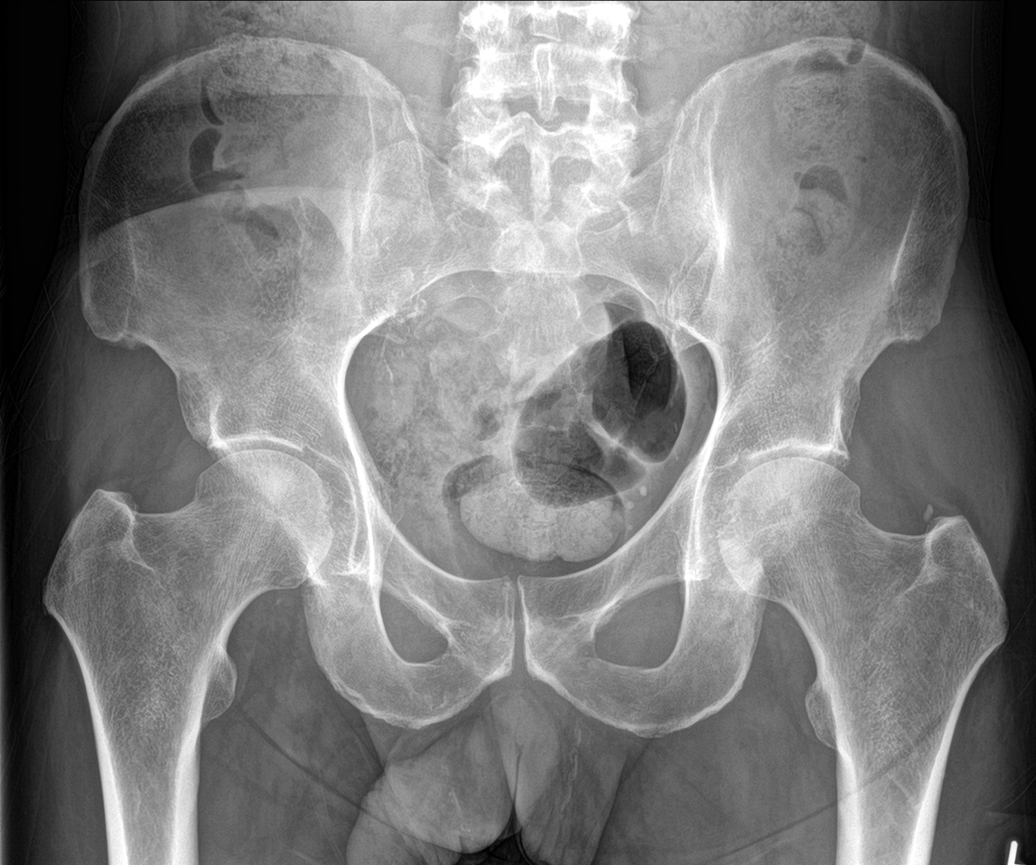

[hip ap (1 of 2)]
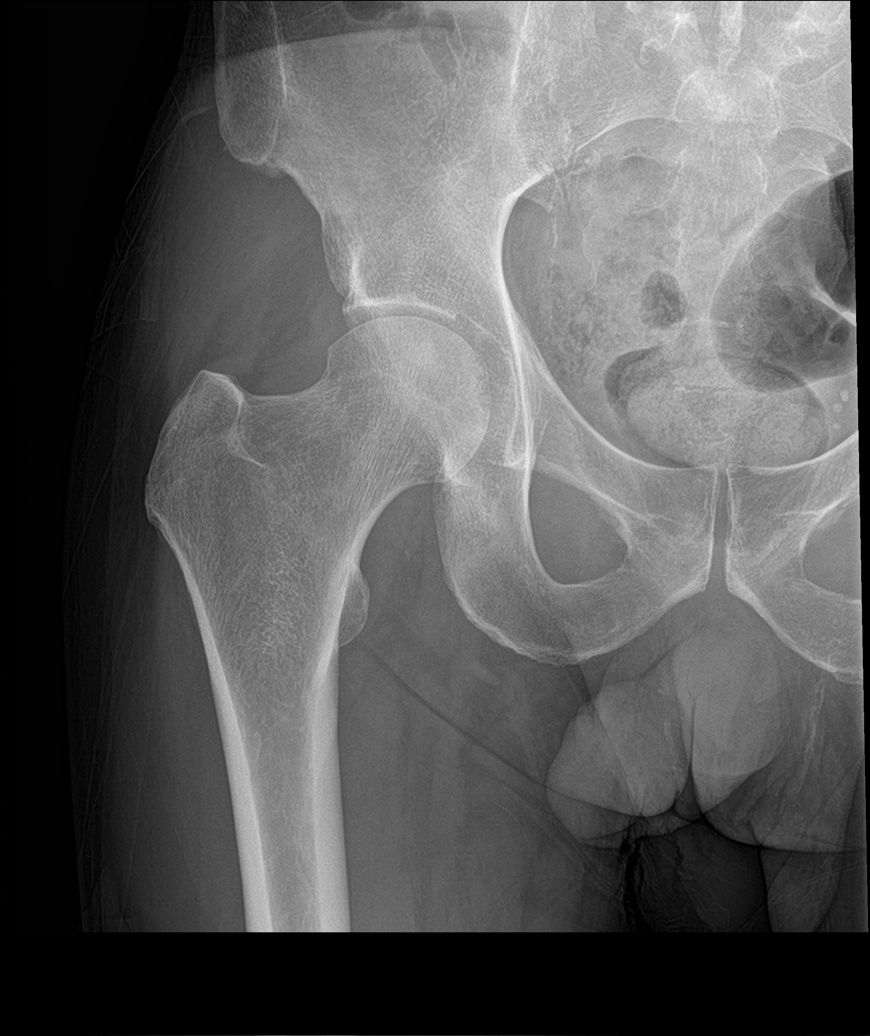

[hip ap (2 of 2)]
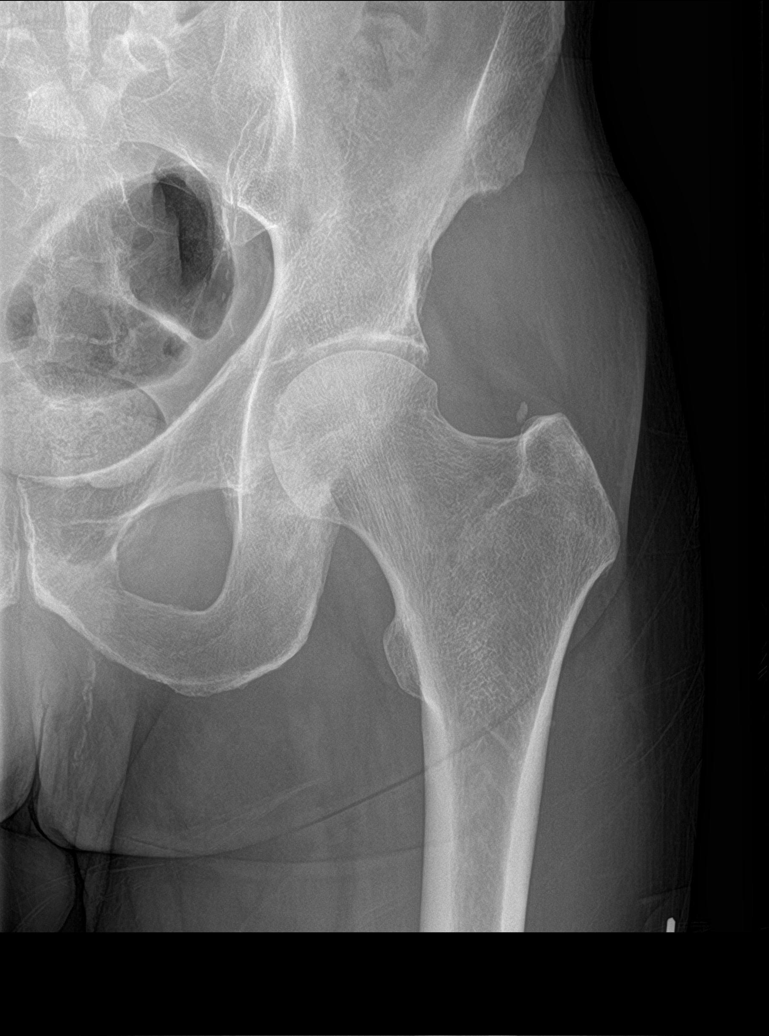

[hip lat (1 of 2)]
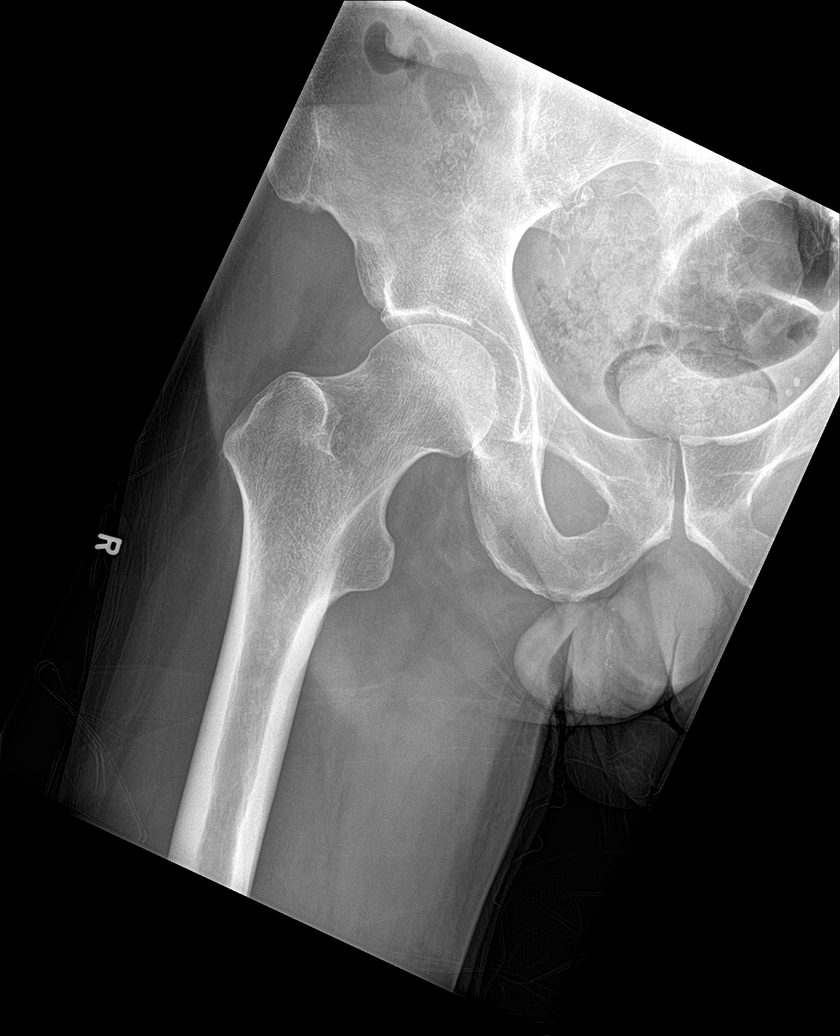

[hip lat (2 of 2)]
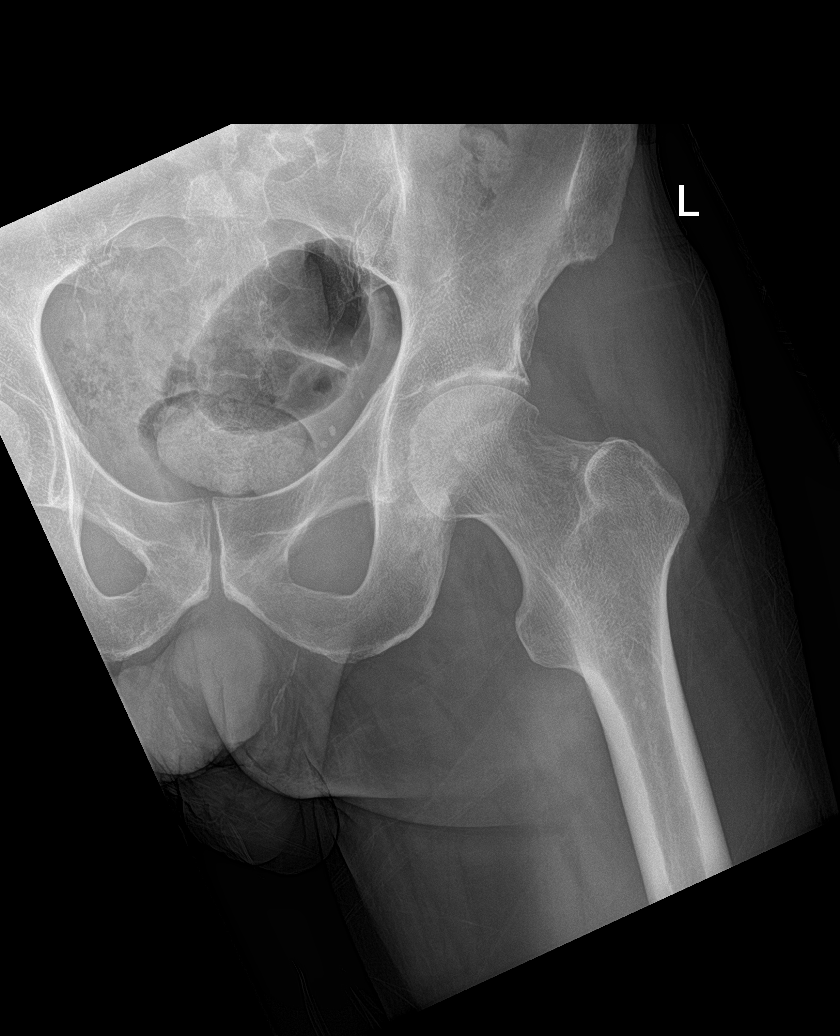

[5 of 5 positions shown; findings below may reference images not displayed]

FINDINGS: No fracture or malalignment. SI joints are patent. Pubic symphysis
is intact. There are mild degenerative changes of both hips.
IMPRESSION: Mild degenerative changes.  No acute osseous abnormality

## 2021-07-26 NOTE — Patient Instructions (Signed)
Labs today.  Xrays at the hospital.  I will discuss with Dr. Nicki Reaper.  Follow up to be arranged after labs and imaging return.

## 2021-07-27 ENCOUNTER — Telehealth: Payer: Self-pay | Admitting: Family Medicine

## 2021-07-27 ENCOUNTER — Emergency Department (HOSPITAL_COMMUNITY): Payer: 59

## 2021-07-27 ENCOUNTER — Other Ambulatory Visit: Payer: Self-pay | Admitting: Family Medicine

## 2021-07-27 ENCOUNTER — Inpatient Hospital Stay (HOSPITAL_COMMUNITY)
Admission: EM | Admit: 2021-07-27 | Discharge: 2021-08-05 | DRG: 469 | Disposition: E | Payer: 59 | Source: Ambulatory Visit | Attending: Internal Medicine | Admitting: Internal Medicine

## 2021-07-27 ENCOUNTER — Other Ambulatory Visit: Payer: Self-pay

## 2021-07-27 DIAGNOSIS — Z794 Long term (current) use of insulin: Secondary | ICD-10-CM

## 2021-07-27 DIAGNOSIS — E1165 Type 2 diabetes mellitus with hyperglycemia: Secondary | ICD-10-CM | POA: Diagnosis present

## 2021-07-27 DIAGNOSIS — E222 Syndrome of inappropriate secretion of antidiuretic hormone: Secondary | ICD-10-CM | POA: Diagnosis present

## 2021-07-27 DIAGNOSIS — E875 Hyperkalemia: Secondary | ICD-10-CM | POA: Diagnosis present

## 2021-07-27 DIAGNOSIS — R339 Retention of urine, unspecified: Secondary | ICD-10-CM | POA: Diagnosis not present

## 2021-07-27 DIAGNOSIS — R338 Other retention of urine: Secondary | ICD-10-CM | POA: Diagnosis not present

## 2021-07-27 DIAGNOSIS — C787 Secondary malignant neoplasm of liver and intrahepatic bile duct: Secondary | ICD-10-CM | POA: Diagnosis present

## 2021-07-27 DIAGNOSIS — E785 Hyperlipidemia, unspecified: Secondary | ICD-10-CM | POA: Diagnosis present

## 2021-07-27 DIAGNOSIS — Z880 Allergy status to penicillin: Secondary | ICD-10-CM

## 2021-07-27 DIAGNOSIS — C7951 Secondary malignant neoplasm of bone: Principal | ICD-10-CM | POA: Diagnosis present

## 2021-07-27 DIAGNOSIS — F17210 Nicotine dependence, cigarettes, uncomplicated: Secondary | ICD-10-CM | POA: Diagnosis present

## 2021-07-27 DIAGNOSIS — J189 Pneumonia, unspecified organism: Secondary | ICD-10-CM | POA: Diagnosis present

## 2021-07-27 DIAGNOSIS — M8458XA Pathological fracture in neoplastic disease, other specified site, initial encounter for fracture: Secondary | ICD-10-CM | POA: Diagnosis present

## 2021-07-27 DIAGNOSIS — J910 Malignant pleural effusion: Secondary | ICD-10-CM | POA: Diagnosis present

## 2021-07-27 DIAGNOSIS — C3411 Malignant neoplasm of upper lobe, right bronchus or lung: Secondary | ICD-10-CM | POA: Diagnosis present

## 2021-07-27 DIAGNOSIS — E119 Type 2 diabetes mellitus without complications: Secondary | ICD-10-CM

## 2021-07-27 DIAGNOSIS — N179 Acute kidney failure, unspecified: Secondary | ICD-10-CM | POA: Insufficient documentation

## 2021-07-27 DIAGNOSIS — C7972 Secondary malignant neoplasm of left adrenal gland: Secondary | ICD-10-CM | POA: Diagnosis present

## 2021-07-27 DIAGNOSIS — E279 Disorder of adrenal gland, unspecified: Secondary | ICD-10-CM | POA: Diagnosis present

## 2021-07-27 DIAGNOSIS — I10 Essential (primary) hypertension: Secondary | ICD-10-CM | POA: Diagnosis present

## 2021-07-27 DIAGNOSIS — Z720 Tobacco use: Secondary | ICD-10-CM | POA: Diagnosis present

## 2021-07-27 DIAGNOSIS — R918 Other nonspecific abnormal finding of lung field: Secondary | ICD-10-CM | POA: Diagnosis present

## 2021-07-27 DIAGNOSIS — C799 Secondary malignant neoplasm of unspecified site: Principal | ICD-10-CM

## 2021-07-27 DIAGNOSIS — Z6820 Body mass index (BMI) 20.0-20.9, adult: Secondary | ICD-10-CM

## 2021-07-27 DIAGNOSIS — K769 Liver disease, unspecified: Secondary | ICD-10-CM | POA: Diagnosis present

## 2021-07-27 DIAGNOSIS — E1169 Type 2 diabetes mellitus with other specified complication: Secondary | ICD-10-CM | POA: Diagnosis present

## 2021-07-27 DIAGNOSIS — Z79899 Other long term (current) drug therapy: Secondary | ICD-10-CM

## 2021-07-27 DIAGNOSIS — I469 Cardiac arrest, cause unspecified: Secondary | ICD-10-CM | POA: Diagnosis not present

## 2021-07-27 DIAGNOSIS — D62 Acute posthemorrhagic anemia: Secondary | ICD-10-CM | POA: Diagnosis not present

## 2021-07-27 DIAGNOSIS — M84552A Pathological fracture in neoplastic disease, left femur, initial encounter for fracture: Secondary | ICD-10-CM | POA: Diagnosis present

## 2021-07-27 DIAGNOSIS — E43 Unspecified severe protein-calorie malnutrition: Secondary | ICD-10-CM | POA: Diagnosis present

## 2021-07-27 DIAGNOSIS — I468 Cardiac arrest due to other underlying condition: Secondary | ICD-10-CM | POA: Diagnosis not present

## 2021-07-27 DIAGNOSIS — M84452A Pathological fracture, left femur, initial encounter for fracture: Secondary | ICD-10-CM | POA: Diagnosis not present

## 2021-07-27 DIAGNOSIS — S22000A Wedge compression fracture of unspecified thoracic vertebra, initial encounter for closed fracture: Secondary | ICD-10-CM | POA: Diagnosis present

## 2021-07-27 DIAGNOSIS — T380X5A Adverse effect of glucocorticoids and synthetic analogues, initial encounter: Secondary | ICD-10-CM | POA: Diagnosis present

## 2021-07-27 DIAGNOSIS — E871 Hypo-osmolality and hyponatremia: Secondary | ICD-10-CM | POA: Diagnosis present

## 2021-07-27 DIAGNOSIS — Z7984 Long term (current) use of oral hypoglycemic drugs: Secondary | ICD-10-CM

## 2021-07-27 LAB — LIPID PANEL
Chol/HDL Ratio: 2.4 ratio (ref 0.0–5.0)
Cholesterol, Total: 150 mg/dL (ref 100–199)
HDL: 63 mg/dL (ref >39)
LDL Chol Calc (NIH): 68 mg/dL (ref 0–99)
Triglycerides: 104 mg/dL (ref 0–149)
VLDL Cholesterol Cal: 19 mg/dL (ref 5–40)

## 2021-07-27 LAB — CBC
Hematocrit: 39.2 % (ref 37.5–51.0)
Hemoglobin: 12.9 g/dL — ABNORMAL LOW (ref 13.0–17.7)
MCH: 28.9 pg (ref 26.6–33.0)
MCHC: 32.9 g/dL (ref 31.5–35.7)
MCV: 88 fL (ref 79–97)
Platelets: 363 10*3/uL (ref 150–450)
RBC: 4.47 x10E6/uL (ref 4.14–5.80)
RDW: 13 % (ref 11.6–15.4)
WBC: 26.1 10*3/uL (ref 3.4–10.8)

## 2021-07-27 LAB — GLUCOSE, CAPILLARY: Glucose-Capillary: 277 mg/dL — ABNORMAL HIGH (ref 70–99)

## 2021-07-27 LAB — CBC WITH DIFFERENTIAL/PLATELET
Abs Immature Granulocytes: 0.28 10*3/uL — ABNORMAL HIGH (ref 0.00–0.07)
Basophils Absolute: 0.1 10*3/uL (ref 0.0–0.1)
Basophils Relative: 0 %
Eosinophils Absolute: 0.1 10*3/uL (ref 0.0–0.5)
Eosinophils Relative: 0 %
HCT: 33.6 % — ABNORMAL LOW (ref 39.0–52.0)
Hemoglobin: 10.9 g/dL — ABNORMAL LOW (ref 13.0–17.0)
Immature Granulocytes: 1 %
Lymphocytes Relative: 6 %
Lymphs Abs: 1.2 10*3/uL (ref 0.7–4.0)
MCH: 29.5 pg (ref 26.0–34.0)
MCHC: 32.4 g/dL (ref 30.0–36.0)
MCV: 90.8 fL (ref 80.0–100.0)
Monocytes Absolute: 1.6 10*3/uL — ABNORMAL HIGH (ref 0.1–1.0)
Monocytes Relative: 8 %
Neutro Abs: 16.8 10*3/uL — ABNORMAL HIGH (ref 1.7–7.7)
Neutrophils Relative %: 85 %
Platelets: 269 10*3/uL (ref 150–400)
RBC: 3.7 MIL/uL — ABNORMAL LOW (ref 4.22–5.81)
RDW: 13.2 % (ref 11.5–15.5)
WBC: 20.1 10*3/uL — ABNORMAL HIGH (ref 4.0–10.5)
nRBC: 0 % (ref 0.0–0.2)

## 2021-07-27 LAB — CMP14+EGFR
ALT: 57 IU/L — ABNORMAL HIGH (ref 0–44)
AST: 46 IU/L — ABNORMAL HIGH (ref 0–40)
Albumin/Globulin Ratio: 1 — ABNORMAL LOW (ref 1.2–2.2)
Albumin: 3.5 g/dL — ABNORMAL LOW (ref 3.8–4.9)
Alkaline Phosphatase: 866 IU/L — ABNORMAL HIGH (ref 44–121)
BUN/Creatinine Ratio: 17 (ref 9–20)
BUN: 31 mg/dL — ABNORMAL HIGH (ref 6–24)
Bilirubin Total: 0.3 mg/dL (ref 0.0–1.2)
CO2: 25 mmol/L (ref 20–29)
Calcium: 14.9 mg/dL (ref 8.7–10.2)
Chloride: 96 mmol/L (ref 96–106)
Creatinine, Ser: 1.87 mg/dL — ABNORMAL HIGH (ref 0.76–1.27)
Globulin, Total: 3.5 g/dL (ref 1.5–4.5)
Glucose: 144 mg/dL — ABNORMAL HIGH (ref 70–99)
Potassium: 5 mmol/L (ref 3.5–5.2)
Sodium: 134 mmol/L (ref 134–144)
Total Protein: 7 g/dL (ref 6.0–8.5)
eGFR: 41 mL/min/{1.73_m2} — ABNORMAL LOW (ref >59)

## 2021-07-27 LAB — CBG MONITORING, ED: Glucose-Capillary: 369 mg/dL — ABNORMAL HIGH (ref 70–99)

## 2021-07-27 LAB — HEMOGLOBIN A1C
Est. average glucose Bld gHb Est-mCnc: 303 mg/dL
Hgb A1c MFr Bld: 12.2 % — ABNORMAL HIGH (ref 4.8–5.6)

## 2021-07-27 LAB — COMPREHENSIVE METABOLIC PANEL
ALT: 38 U/L (ref 0–44)
AST: 32 U/L (ref 15–41)
Albumin: 2.4 g/dL — ABNORMAL LOW (ref 3.5–5.0)
Alkaline Phosphatase: 570 U/L — ABNORMAL HIGH (ref 38–126)
Anion gap: 8 (ref 5–15)
BUN: 38 mg/dL — ABNORMAL HIGH (ref 6–20)
CO2: 25 mmol/L (ref 22–32)
Calcium: 14 mg/dL (ref 8.9–10.3)
Chloride: 99 mmol/L (ref 98–111)
Creatinine, Ser: 2.28 mg/dL — ABNORMAL HIGH (ref 0.61–1.24)
GFR, Estimated: 33 mL/min — ABNORMAL LOW (ref >60)
Glucose, Bld: 318 mg/dL — ABNORMAL HIGH (ref 70–99)
Potassium: 5.3 mmol/L — ABNORMAL HIGH (ref 3.5–5.1)
Sodium: 132 mmol/L — ABNORMAL LOW (ref 135–145)
Total Bilirubin: 0.8 mg/dL (ref 0.3–1.2)
Total Protein: 5.8 g/dL — ABNORMAL LOW (ref 6.5–8.1)

## 2021-07-27 LAB — MICROALBUMIN / CREATININE URINE RATIO
Creatinine, Urine: 93.4 mg/dL
Microalb/Creat Ratio: 184 mg/g creat — ABNORMAL HIGH (ref 0–29)
Microalbumin, Urine: 171.8 ug/mL

## 2021-07-27 LAB — PSA: Prostate Specific Ag, Serum: 0.4 ng/mL (ref 0.0–4.0)

## 2021-07-27 LAB — HIV ANTIBODY (ROUTINE TESTING W REFLEX): HIV Screen 4th Generation wRfx: NONREACTIVE

## 2021-07-27 LAB — CK: Total CK: 29 U/L — ABNORMAL LOW (ref 49–397)

## 2021-07-27 LAB — URIC ACID: Uric Acid, Serum: 6.1 mg/dL (ref 3.7–8.6)

## 2021-07-27 IMAGING — CT CT CHEST-ABD-PELV W/O CM
2 of 5 series · 12 of 46 positions shown, 14 images · non-contrast
Comparison: None Available.

CLINICAL DATA: lung mass, weight loss, lumbar/hip pain



[Series 4: cap w/o 2.0 mm st · axial · non-contrast · 0.89mm/px · z∈[+720,+1340]mm · 9 of 356 slices shown, 11 images]
[im 23/356  soft-tissue]
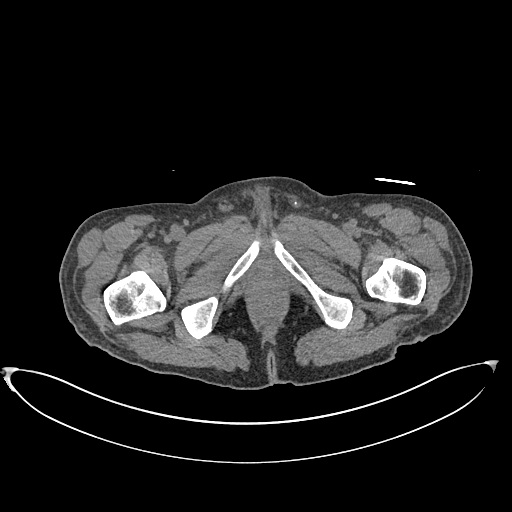
[im 23/356  bone]
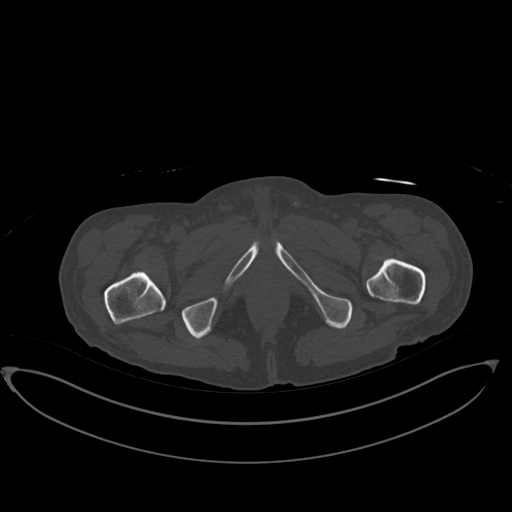
[im 67/356  soft-tissue]
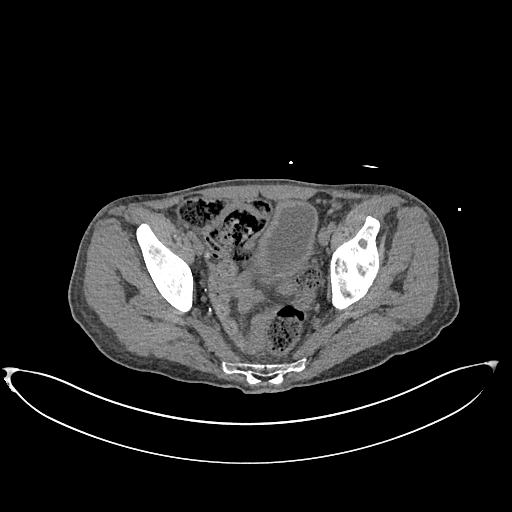
[im 111/356  soft-tissue]
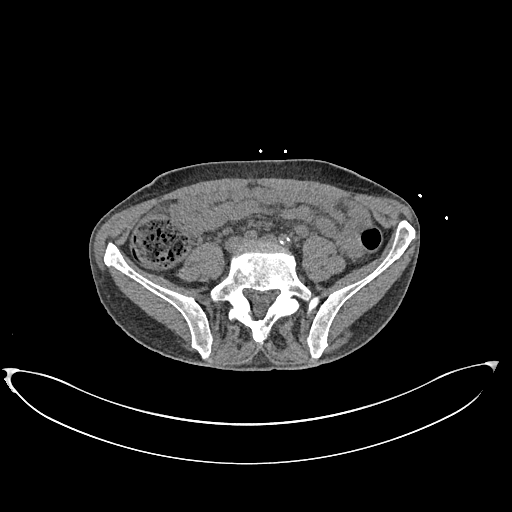
[im 134/356  soft-tissue]
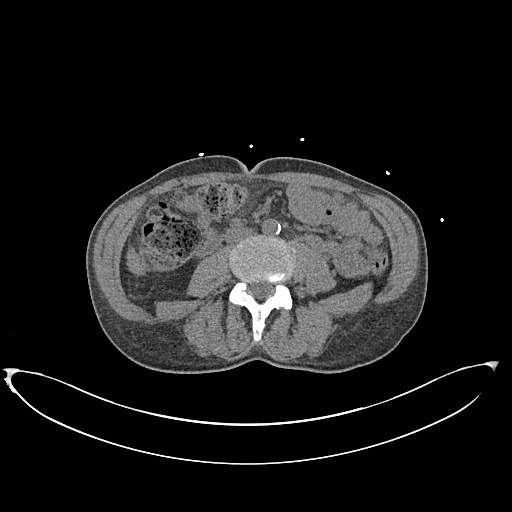
[im 178/356  soft-tissue]
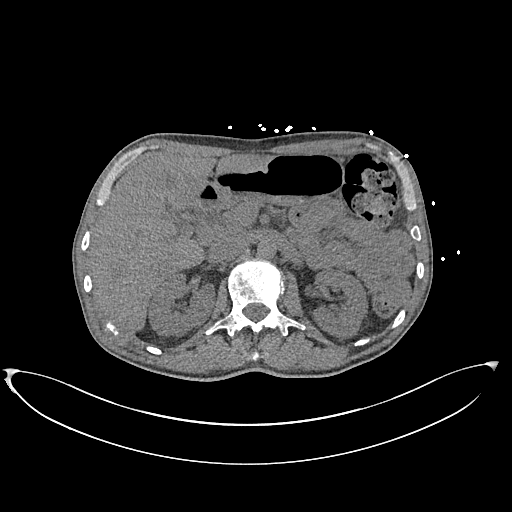
[im 222/356  soft-tissue]
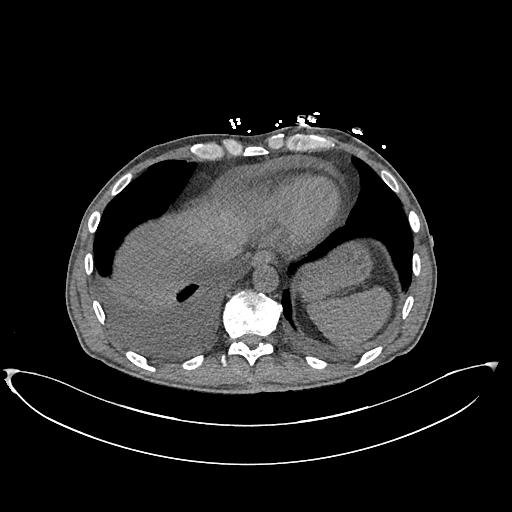
[im 245/356  soft-tissue]
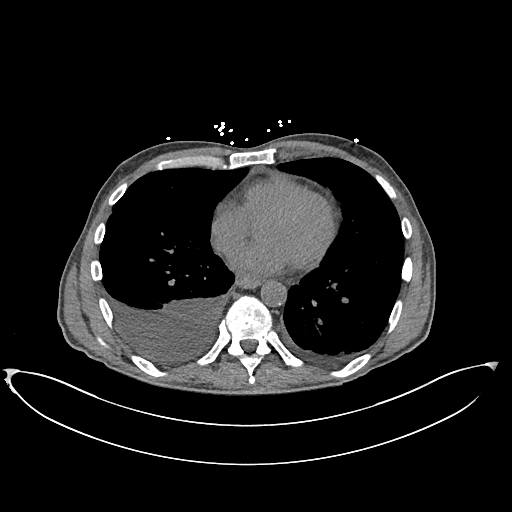
[im 289/356  soft-tissue]
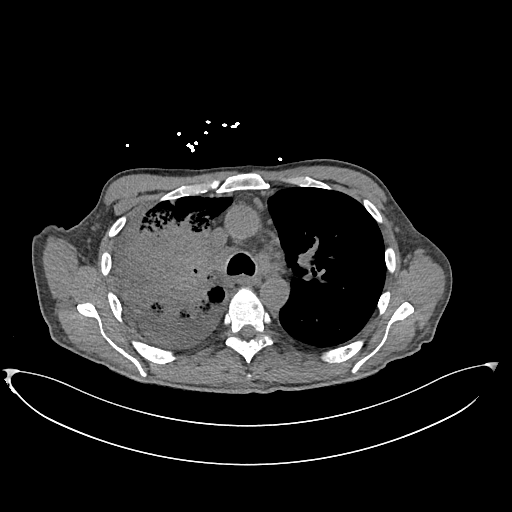
[im 333/356  soft-tissue]
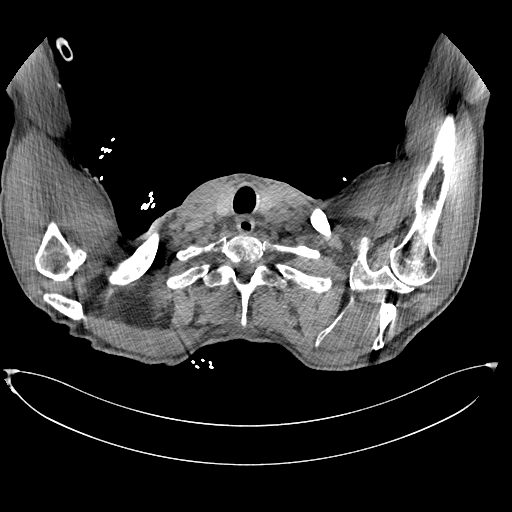
[im 333/356  bone]
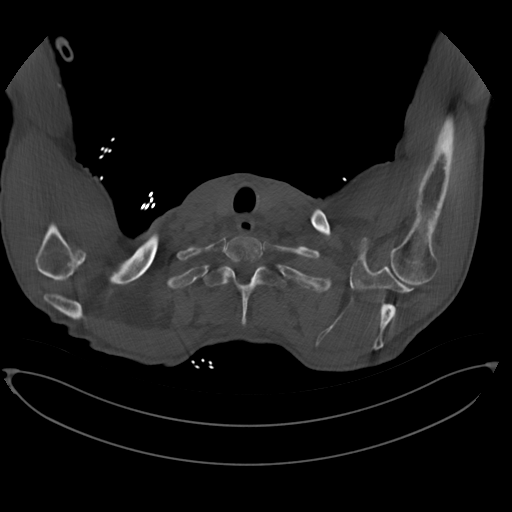

[Series 7: cap w/o 3.0 mm st cor · coronal · non-contrast · 0.76mm/px · 3 of 108 slices shown]
[im 36/108  soft-tissue]
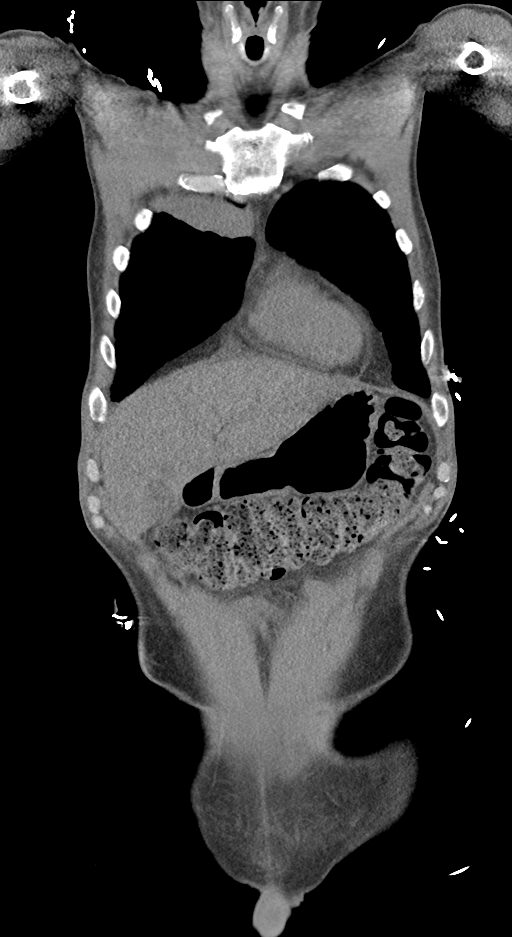
[im 48/108  soft-tissue]
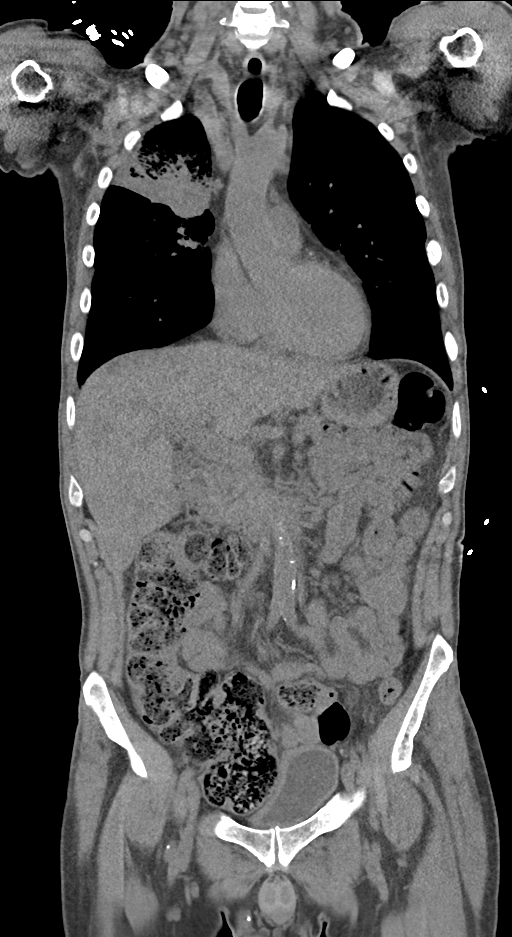
[im 60/108  soft-tissue]
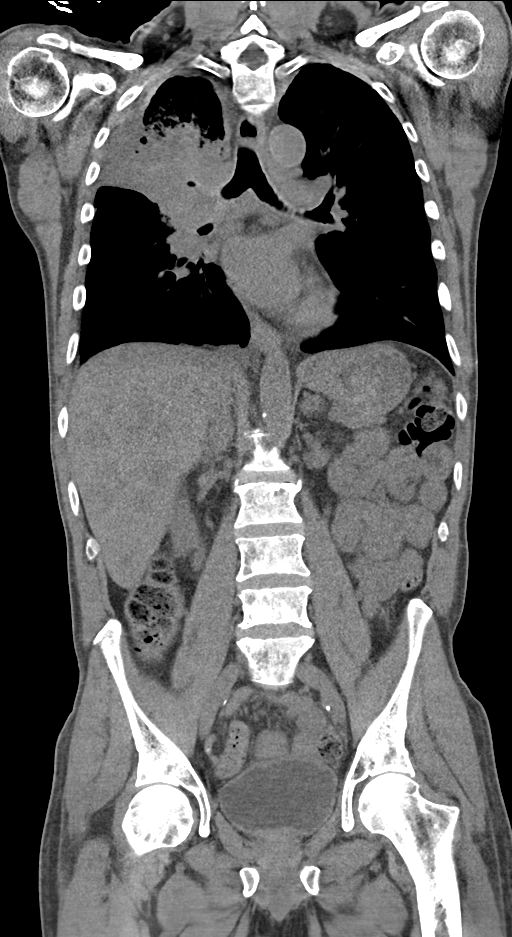

[12 of 46 positions shown; findings below may reference images not displayed]

FINDINGS: Evaluation is limited with lack of IV contrast.

CT CHEST FINDINGS

Cardiovascular: Heart is the upper limits of normal in size. Mild
LEFT-sided coronary artery atherosclerotic calcifications. Small
pericardial effusion. Aorta is normal in course and caliber.
Scattered atherosclerotic calcifications.

Mediastinum/Nodes: Visualized thyroid is unremarkable. Mildly
prominent LEFT subpectoral axillary lymph node measures 9 mm in the
short axis (series 4, image 51). There is mediastinal adenopathy.
Representative rounded precarinal lymph node measures 11 mm (series
4, image 74). Evaluation for hilar adenopathy is limited secondary
to lack of IV contrast. Ill-defined soft tissue encases the RIGHT
mainstem bronchus and extends inferior to the carina and along the
course of the trachea. This ill-defined soft tissue is seen within
the AP window.

Lungs/Pleura: Moderate RIGHT pleural effusion. Trace LEFT pleural
effusion. Irregular consolidative opacity throughout the majority of
the RIGHT upper lobe with interlobular septal thickening. Masslike
consolidation centrally measures approximately 7.5 x 6.4 cm (series
6, image 61). It demonstrates mass effect on the RIGHT upper lobe
bronchi and occludes them superiorly. Irregular soft tissue extends
into the RIGHT middle and lower lobe with ill-defined soft tissue
seen coursing along the RIGHT mainstem bronchus. Patchy LEFT basilar
irregular centrilobular nodular opacities, nonspecific.

Musculoskeletal: Infiltrative osseous metastatic disease is favored
to involve every thoracic vertebral body and many of the visualized
cervical vertebral bodies. Pathologic compression fracture deformity
of T5. Lesions involve multiple bilateral ribs with scattered the
pathologic fractures noted including the LEFT-sided fifth rib and
RIGHT-sided eleventh rib. There are 13 thoracic type vertebral
bodies.

CT ABDOMEN PELVIS FINDINGS

Hepatobiliary: There are multiple incompletely characterized
hypoechoic masses with representative incompletely characterized
hypoechoic mass of the RIGHT liver (series 3, image 75). These are
incompletely characterized given lack of IV contrast. Gallbladder is
unremarkable.

Pancreas: No peripancreatic fat stranding.

Spleen: Unremarkable.

Adrenals/Urinary Tract: Fullness of the LEFT adrenal gland. No
hydronephrosis. No obstructing nephrolithiasis. Bladder is
unremarkable.

Stomach/Bowel: No evidence of bowel obstruction. Moderate colonic
stool burden predominately within the RIGHT hemicolon. Appendix is
normal.

Vascular/Lymphatic: Atherosclerotic calcifications of the
nonaneurysmal abdominal aorta. No definitive intra-abdominal
adenopathy is identified in the limitations of this noncontrast
exam.

Reproductive: Prostate is present

Other: No free air.  No free fluid.

Musculoskeletal: Soft tissue mass involving the LEFT femoral neck
and eroding the cortex anteriorly (series 4, image 310). It places
patient at the risk for pathologic fracture. Infiltrative metastatic
disease involving the LEFT acetabulum, pelvis, sacrum and all of the
lumbar vertebral bodies.
IMPRESSION: 1. Mass involving the majority of the RIGHT upper lobe with soft
tissue extending along the RIGHT upper lobe and RIGHT mainstem
bronchus, along the carina and into the AP window. This is
concerning for primary lung malignancy.
2. Widespread osseous metastatic disease. Of note there is an
osseous lesion involving the LEFT femoral head and neck cortex which
places the patient at risk for impending pathologic fracture.
Recommend consultation with Orthopedic surgery.
3. Moderate RIGHT pleural effusion, possibly malignant.
4. Multiple hypoechoic masses throughout the liver, incompletely
assessed given lack of IV contrast. These are concerning for hepatic
metastatic disease.
5. Fullness of the LEFT adrenal gland, likely adrenal metastatic
disease.
6. There may be a postobstructive infectious or atelectatic
component within the RIGHT upper lobe. Lymphangitic spread remains a
differential consideration.
7. Scattered LEFT basilar centrilobular nodular opacities are
nonspecific and could be infectious or inflammatory in etiology
versus reflect additional metastatic disease.
8. Pathologic compression fracture of T5.

These results were called by telephone at the time of interpretation
on [DATE] at [DATE] to provider TIGER , who verbally
acknowledged these results.

## 2021-07-27 IMAGING — DX DG CHEST 2V
2 series · 2 of 2 positions shown · non-contrast
Comparison: Chest radiograph from one day prior.

CLINICAL DATA: Dyspnea

EXAM:
CHEST - 2 VIEW

[chest pa]
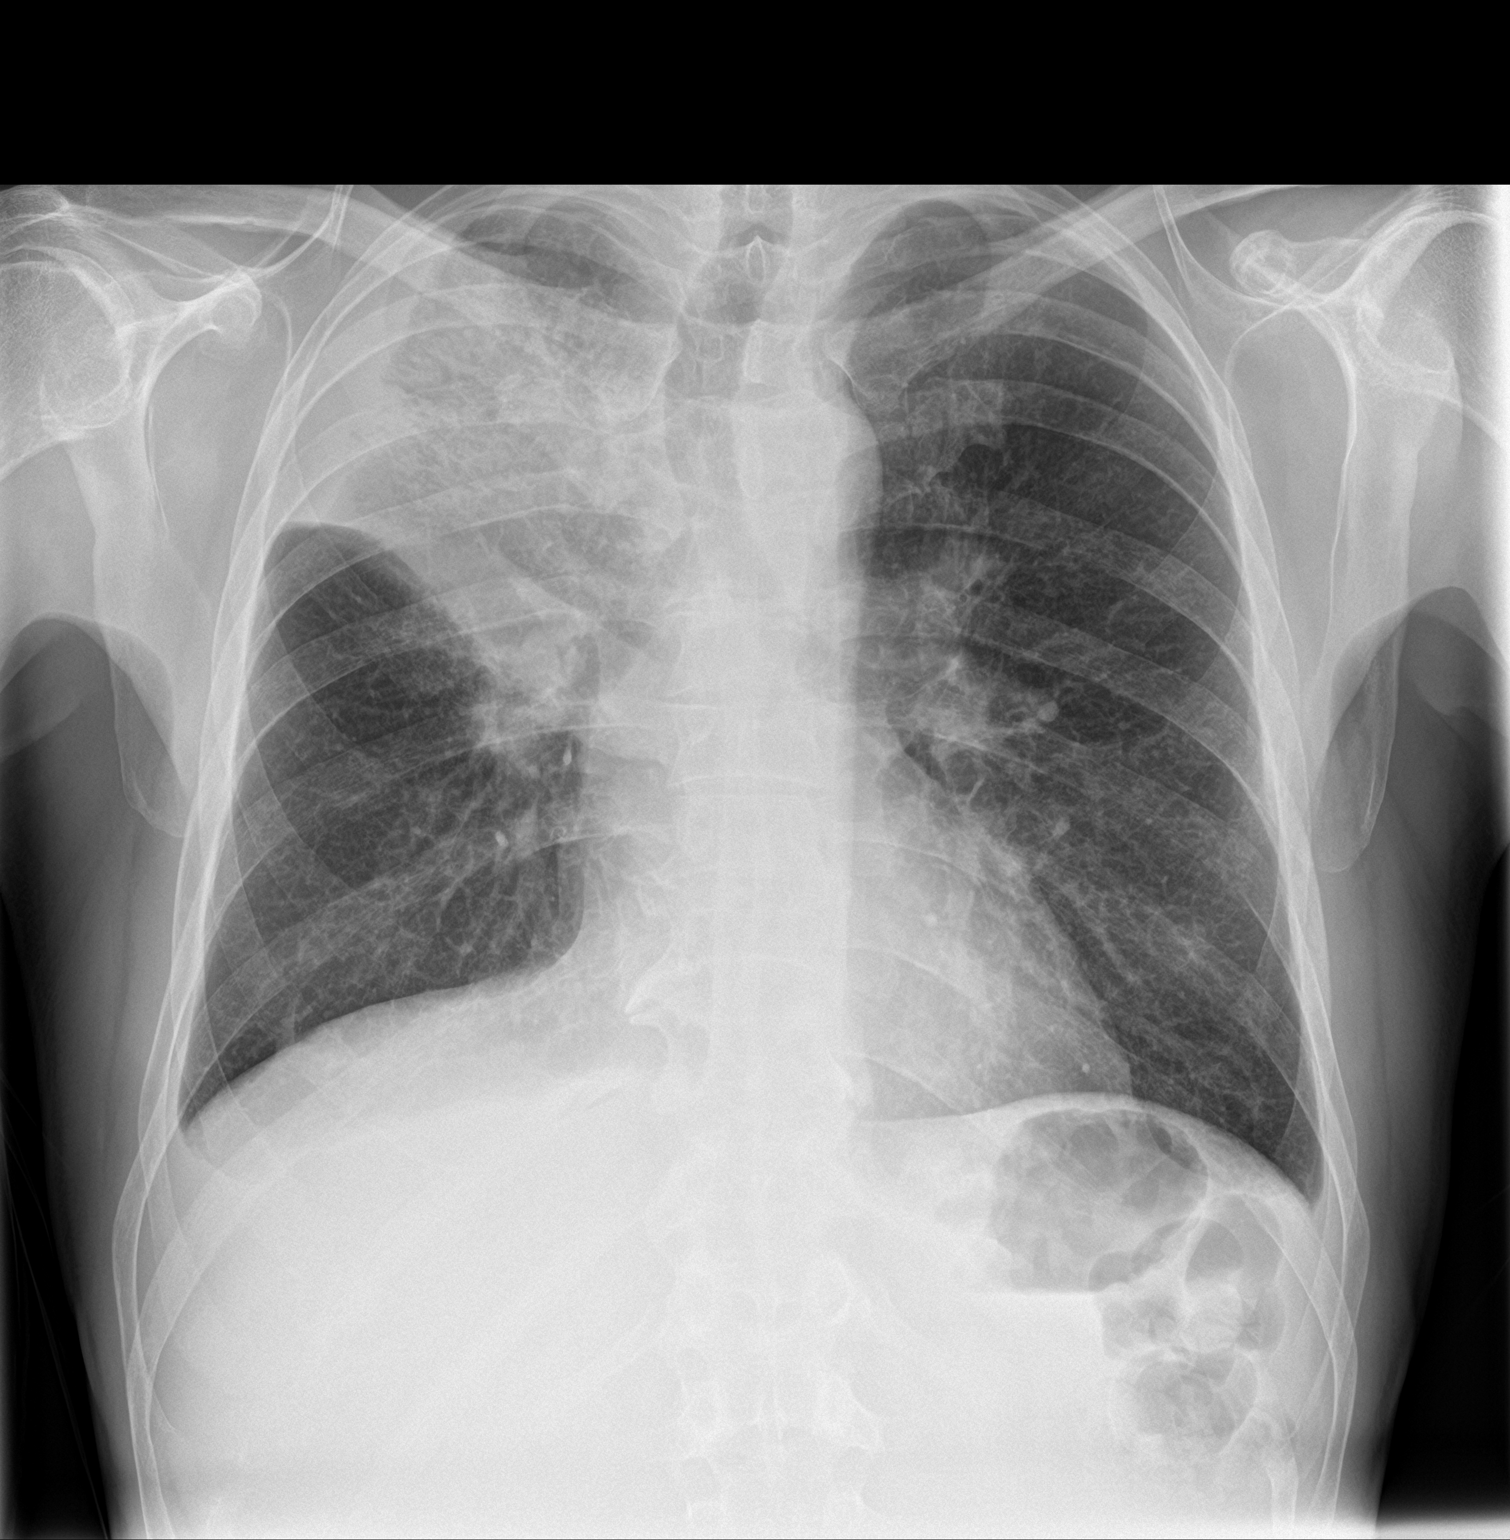

[chest lat]
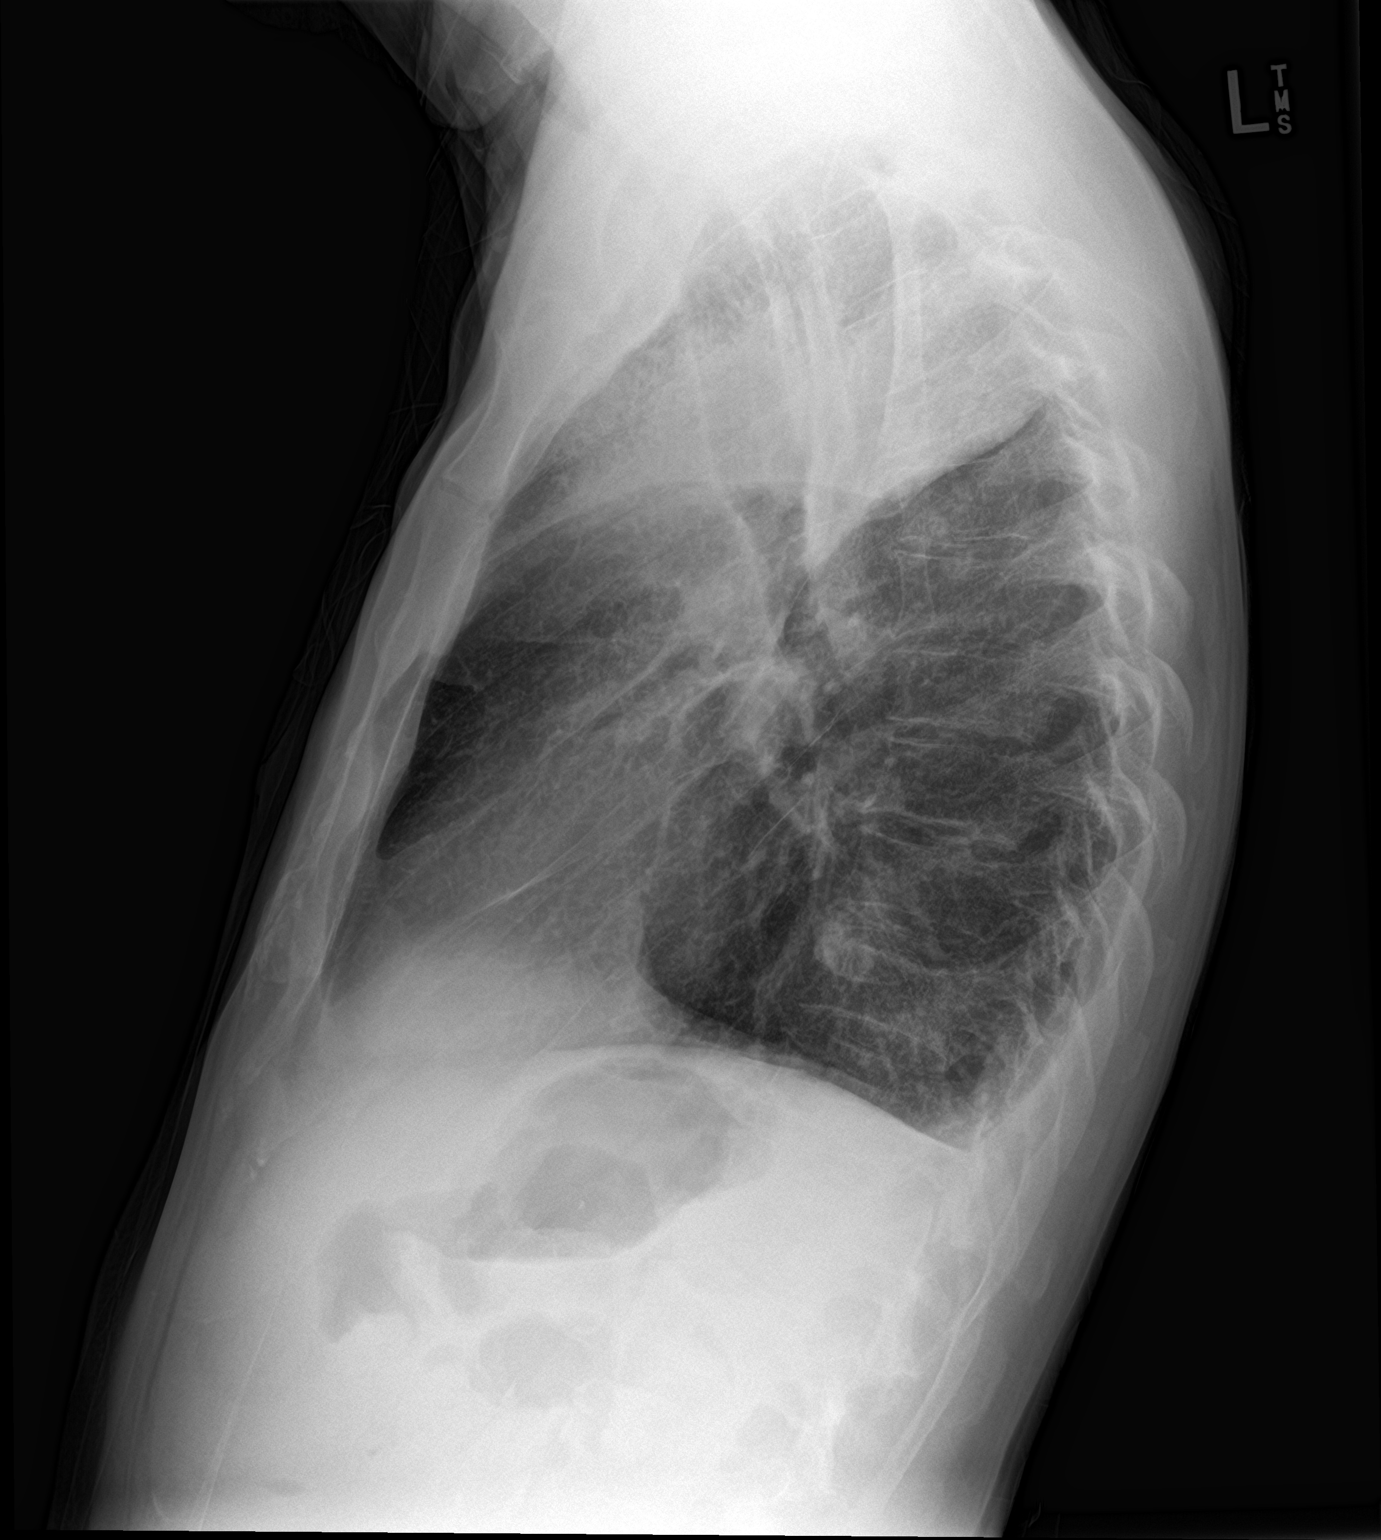

[2 of 2 positions shown; findings below may reference images not displayed]

FINDINGS: Stable cardiomediastinal silhouette with normal heart size. No
pneumothorax. Stable trace bilateral pleural effusions. Dense right
upper lobe consolidation is stable. Clear left lung.
IMPRESSION: 1. Stable dense right upper lobe consolidation compatible with
pneumonia. Underlying obstructing right upper lung lesion not
excluded. Continued follow-up chest radiographs recommended at a
minimum to document resolution. Chest CT with IV contrast may be
considered.
2. Stable trace bilateral pleural effusions.

## 2021-07-27 MED ORDER — HEPARIN SODIUM (PORCINE) 5000 UNIT/ML IJ SOLN: 5000.0000 [IU] | Freq: Three times a day (TID) | INTRAMUSCULAR | Status: DC

## 2021-07-27 MED ORDER — DEXAMETHASONE SODIUM PHOSPHATE 4 MG/ML IJ SOLN
4.0000 mg | Freq: Four times a day (QID) | INTRAMUSCULAR | Status: DC
  Administered 2021-07-27 – 2021-07-30 (×10): 4 mg via INTRAVENOUS
  Filled 2021-07-27 (×12): qty 1

## 2021-07-27 MED ORDER — FENTANYL CITRATE PF 50 MCG/ML IJ SOSY
50.0000 ug | PREFILLED_SYRINGE | Freq: Once | INTRAMUSCULAR | Status: AC
  Administered 2021-07-27: 50 ug via INTRAVENOUS
  Filled 2021-07-27: qty 1

## 2021-07-27 MED ORDER — NICOTINE 21 MG/24HR TD PT24
21.0000 mg | MEDICATED_PATCH | Freq: Every day | TRANSDERMAL | Status: DC
  Administered 2021-07-27 – 2021-08-01 (×6): 21 mg via TRANSDERMAL
  Filled 2021-07-27 (×6): qty 1

## 2021-07-27 MED ORDER — INSULIN ASPART 100 UNIT/ML IJ SOLN
0.0000 [IU] | Freq: Three times a day (TID) | INTRAMUSCULAR | Status: DC
  Administered 2021-07-27: 15 [IU] via SUBCUTANEOUS
  Administered 2021-07-28: 11 [IU] via SUBCUTANEOUS

## 2021-07-27 MED ORDER — ONDANSETRON HCL 4 MG PO TABS: 4.0000 mg | ORAL_TABLET | Freq: Four times a day (QID) | ORAL | Status: DC | PRN

## 2021-07-27 MED ORDER — SODIUM CHLORIDE 0.9 % IV BOLUS
1000.0000 mL | Freq: Once | INTRAVENOUS | Status: AC
  Administered 2021-07-27: 1000 mL via INTRAVENOUS

## 2021-07-27 MED ORDER — SODIUM CHLORIDE 0.9 % IV SOLN: INTRAVENOUS | Status: DC

## 2021-07-27 MED ORDER — HYDROMORPHONE HCL 1 MG/ML IJ SOLN
0.5000 mg | INTRAMUSCULAR | Status: DC | PRN
  Administered 2021-07-27 – 2021-08-01 (×15): 0.5 mg via INTRAVENOUS
  Filled 2021-07-27 (×15): qty 0.5

## 2021-07-27 MED ORDER — CALCITONIN (SALMON) 200 UNIT/ML IJ SOLN
4.0000 [IU]/kg | Freq: Two times a day (BID) | INTRAMUSCULAR | Status: DC
  Administered 2021-07-27: 272 [IU] via SUBCUTANEOUS
  Filled 2021-07-27 (×2): qty 1.36

## 2021-07-27 MED ORDER — CALCITONIN (SALMON) 200 UNIT/ML IJ SOLN
4.0000 [IU]/kg | Freq: Two times a day (BID) | INTRAMUSCULAR | Status: AC
  Administered 2021-07-28 – 2021-07-29 (×3): 272 [IU] via SUBCUTANEOUS
  Filled 2021-07-27 (×3): qty 1.36

## 2021-07-27 MED ORDER — ONDANSETRON HCL 4 MG/2ML IJ SOLN
4.0000 mg | Freq: Four times a day (QID) | INTRAMUSCULAR | Status: DC | PRN
  Administered 2021-07-28: 4 mg via INTRAVENOUS
  Filled 2021-07-27: qty 2

## 2021-07-27 MED ORDER — IPRATROPIUM-ALBUTEROL 0.5-2.5 (3) MG/3ML IN SOLN: 3.0000 mL | Freq: Once | RESPIRATORY_TRACT | Status: DC

## 2021-07-27 MED ORDER — HYDRALAZINE HCL 25 MG PO TABS
25.0000 mg | ORAL_TABLET | Freq: Four times a day (QID) | ORAL | Status: DC | PRN
  Administered 2021-07-29: 25 mg via ORAL
  Filled 2021-07-27 (×2): qty 1

## 2021-07-27 MED ORDER — SODIUM CHLORIDE 0.9 % IV SOLN
60.0000 mg | Freq: Once | INTRAVENOUS | Status: AC
  Administered 2021-07-27: 60 mg via INTRAVENOUS
  Filled 2021-07-27: qty 20

## 2021-07-27 NOTE — ED Notes (Signed)
Pt c/o fatigue and cough 1 month. Cough non-productive. Pt also reports abnormal labs. Pt A&O 4.

## 2021-07-27 NOTE — ED Provider Notes (Addendum)
[facility] EMERGENCY DEPARTMENT Provider Note   CSN: [id number] Arrival date & time: 07/22/2021  0941     History  Chief Complaint  Patient presents with   Fatigue   Dehydration    Michael Fritz is a 56 y.o. male.  Patient is a 56 year old male with a history of diabetes, hypertension, hyperlipidemia and tobacco use who presents with fatigue and abnormal blood work.  He states he has had about a 12 pound weight loss since February.  He has had some increasing generalized fatigue.  He does not report any shortness of breath but he gets very fatigued after short amounts of exertion.  No chest pain.  He has had a cough for about a month.  He has had a little bit of a decreased appetite.  He has had some increasing pain in his lower back and hips.  He attributes this to walking a lot during a recent trip to [place].  He says it feels like it is more in the muscles of his lower back.  It does not radiate down his legs but he does have some associated pain in his hips.  Is worse with movement and certain positions.  No leg swelling.  No nausea or vomiting.  No fevers.  He went to his PCP and had some abnormal blood work including a markedly elevated calcium, worsening kidney function and increased alk phos.  He also had a chest x-ray which was concerning for either mass or pneumonia.      Home Medications Prior to Admission medications   Medication Sig Start Date End Date Taking? Authorizing Provider  AMBULATORY NON FORMULARY MEDICATION 0.2 mLs by Intracavernosal route as needed. Medication Name: Trimix  PGE 33mg Pap 334mPhent 80m24matient not taking: Reported on 07/26/2021 01/13/21   McKCleon GustinD  atorvastatin (LIPITOR) 40 MG tablet Take 1 tablet (40 mg total) by mouth daily. 04/28/21   LukKathyrn DrownD  Continuous Blood Gluc Sensor (FREESTYLE LIBRE 14 DAY SENSOR) MISC USE TO CHECK SUGARS 4 TIMES DAILY 06/28/21   LukKathyrn DrownD  insulin glargine (LANTUS  SOLOSTAR) 100 UNIT/ML Solostar Pen Take 50 - 55 units once daily 04/28/21   LukKathyrn DrownD  Insulin Pen Needle (BD PEN NEEDLE NANO U/F) 32G X 4 MM MISC three times a day 11/23/20   LukKathyrn DrownD  metFORMIN (GLUCOPHAGE XR) 500 MG 24 hr tablet 1 bid 04/28/21   LukKathyrn DrownD  valsartan (DIOVAN) 160 MG tablet Take 1 tablet (160 mg total) by mouth daily. 04/28/21   LukKathyrn DrownD      Allergies    Penicillins    Review of Systems   Review of Systems  Constitutional:  Positive for appetite change, fatigue and unexpected weight change. Negative for chills, diaphoresis and fever.  HENT:  Negative for congestion, rhinorrhea and sneezing.   Eyes: Negative.   Respiratory:  Positive for cough. Negative for chest tightness and shortness of breath.   Cardiovascular:  Negative for chest pain and leg swelling.  Gastrointestinal:  Negative for abdominal pain, blood in stool, diarrhea, nausea and vomiting.  Genitourinary:  Negative for difficulty urinating, flank pain, frequency and hematuria.  Musculoskeletal:  Positive for arthralgias and back pain.  Skin:  Negative for rash.  Neurological:  Negative for dizziness, speech difficulty, weakness, numbness and headaches.   Physical Exam Updated Vital Signs BP (!) 170/71   Pulse 66   Temp 98  F (36.7 C) (Oral)   Resp 16   Ht 6' (1.829 m)   Wt 68 kg   SpO2 96%   BMI 20.34 kg/m  Physical Exam Constitutional:      Appearance: He is well-developed.  HENT:     Head: Normocephalic and atraumatic.  Eyes:     Pupils: Pupils are equal, round, and reactive to light.  Cardiovascular:     Rate and Rhythm: Normal rate and regular rhythm.     Heart sounds: Normal heart sounds.  Pulmonary:     Effort: Pulmonary effort is normal. No respiratory distress.     Breath sounds: Normal breath sounds. No wheezing or rales.  Chest:     Chest wall: No tenderness.  Abdominal:     General: Bowel sounds are normal.     Palpations: Abdomen is  soft.     Tenderness: There is no abdominal tenderness. There is no guarding or rebound.  Musculoskeletal:        General: Normal range of motion.     Cervical back: Normal range of motion and neck supple.     Comments: No reproducible tenderness on palpation of the spine.  There is some tenderness in the musculature of the lower back bilaterally, no specific pain on range of motion of the hips.  No pain to the knees or ankles.  Pedal pulses are intact.  Normal sensation and motor function distally.  Lymphadenopathy:     Cervical: No cervical adenopathy.  Skin:    General: Skin is warm and dry.     Findings: No rash.  Neurological:     General: No focal deficit present.     Mental Status: He is alert and oriented to person, place, and time.    ED Results / Procedures / Treatments   Labs (all labs ordered are listed, but only abnormal results are displayed) Labs Reviewed  CBC WITH DIFFERENTIAL/PLATELET - Abnormal; Notable for the following components:      Result Value   WBC 20.1 (*)    RBC 3.70 (*)    Hemoglobin 10.9 (*)    HCT 33.6 (*)    Neutro Abs 16.8 (*)    Monocytes Absolute 1.6 (*)    Abs Immature Granulocytes 0.28 (*)    All other components within normal limits  COMPREHENSIVE METABOLIC PANEL - Abnormal; Notable for the following components:   Sodium 132 (*)    Potassium 5.3 (*)    Glucose, Bld 318 (*)    BUN 38 (*)    Creatinine, Ser 2.28 (*)    Calcium 14.0 (*)    Total Protein 5.8 (*)    Albumin 2.4 (*)    Alkaline Phosphatase 570 (*)    GFR, Estimated 33 (*)    All other components within normal limits    EKG EKG Interpretation  Date/Time:  Tuesday Jul 27 2021 12:49:44 EDT Ventricular Rate:  59 PR Interval:  157 QRS Duration: 94 QT Interval:  408 QTC Calculation: 405 R Axis:   -34 Text Interpretation: Sinus rhythm Probable left ventricular hypertrophy ST elevation, consider anterior injury Confirmed by Malvin Johns [phone]) on 07/09/2021 1:25:55  PM  Radiology DG Chest 2 View  Result Date: 07/17/2021 CLINICAL DATA:  Dyspnea EXAM: CHEST - 2 VIEW COMPARISON:  Chest radiograph from one day prior. FINDINGS: Stable cardiomediastinal silhouette with normal heart size. No pneumothorax. Stable trace bilateral pleural effusions. Dense right upper lobe consolidation is stable. Clear left lung. IMPRESSION: 1. Stable dense right upper  lobe consolidation compatible with pneumonia. Underlying obstructing right upper lung lesion not excluded. Continued follow-up chest radiographs recommended at a minimum to document resolution. Chest CT with IV contrast may be considered. 2. Stable trace bilateral pleural effusions. Electronically Signed   By: Ilona Sorrel M.D.   On: 07/25/2021 10:56   DG Chest 2 View  Result Date: 07/26/2021 CLINICAL DATA:  Cough EXAM: CHEST - 2 VIEW COMPARISON:  Report 05/13/2011 FINDINGS: Small bilateral pleural effusions. Airspace disease in the right upper lobe. Normal cardiac size. No pneumothorax. IMPRESSION: 1. Airspace disease in the right upper lobe could be due to pneumonia but pulmonary mass or central lesion is also possible. Consider correlation with contrast-enhanced chest CT 2. Small bilateral effusions Electronically Signed   By: Donavan Foil M.D.   On: 07/26/2021 17:18   DG Lumbar Spine Complete  Result Date: 07/26/2021 CLINICAL DATA:  Provided history: Low back pain. Additional history provided: Cough since February, low back pain, bilateral hip pain. EXAM: LUMBAR SPINE - COMPLETE 4+ VIEW COMPARISON:  No pertinent prior exams available for comparison. FINDINGS: Five lumbar vertebrae. The caudal most well-formed intervertebral disc space is designated L5-S1. Minimal grade 1 retrolisthesis at T12-L1, L1-L2, L2-L3, L3-L4 and L5-S1. No lumbar vertebral compression fracture. No more than mild disc space narrowing within the lumbar spine. Facet arthrosis, greatest at L4-L5 and L5-S1. Large stool burden within the colon and  rectum. Aortic atherosclerosis. IMPRESSION: No lumbar vertebral compression fracture. Minimal grade 1 retrolisthesis at T12-L1, L1-L2, L2-L3, L3-L4 and L5-S1. Lumbar spondylosis, as described. Large stool burden within the colon and rectum, suggesting constipation. Aortic Atherosclerosis (ICD10-I70.0). Electronically Signed   By: Kellie Simmering D.O.   On: 07/26/2021 17:18   DG HIPS BILAT WITH PELVIS MIN 5 VIEWS  Result Date: 07/26/2021 CLINICAL DATA:  Hip pain EXAM: DG HIP (WITH OR WITHOUT PELVIS) 5+V BILAT COMPARISON:  None Available. FINDINGS: No fracture or malalignment. SI joints are patent. Pubic symphysis is intact. There are mild degenerative changes of both hips. IMPRESSION: Mild degenerative changes.  No acute osseous abnormality Electronically Signed   By: Donavan Foil M.D.   On: 07/26/2021 17:17   CT CHEST ABDOMEN PELVIS WO CONTRAST  Result Date: 07/20/2021 CLINICAL DATA:  lung mass, weight loss, lumbar/hip pain EXAM: CT CHEST, ABDOMEN AND PELVIS WITHOUT CONTRAST TECHNIQUE: Multidetector CT imaging of the chest, abdomen and pelvis was performed following the standard protocol without IV contrast. RADIATION DOSE REDUCTION: This exam was performed according to the departmental dose-optimization program which includes automated exposure control, adjustment of the mA and/or kV according to patient size and/or use of iterative reconstruction technique. COMPARISON:  None Available. FINDINGS: Evaluation is limited with lack of IV contrast. CT CHEST FINDINGS Cardiovascular: Heart is the upper limits of normal in size. Mild LEFT-sided coronary artery atherosclerotic calcifications. Small pericardial effusion. Aorta is normal in course and caliber. Scattered atherosclerotic calcifications. Mediastinum/Nodes: Visualized thyroid is unremarkable. Mildly prominent LEFT subpectoral axillary lymph node measures 9 mm in the short axis (series 4, image 51). There is mediastinal adenopathy. Representative rounded  precarinal lymph node measures 11 mm (series 4, image 74). Evaluation for hilar adenopathy is limited secondary to lack of IV contrast. Ill-defined soft tissue encases the RIGHT mainstem bronchus and extends inferior to the carina and along the course of the trachea. This ill-defined soft tissue is seen within the AP window. Lungs/Pleura: Moderate RIGHT pleural effusion. Trace LEFT pleural effusion. Irregular consolidative opacity throughout the majority of the RIGHT upper lobe with interlobular  septal thickening. Masslike consolidation centrally measures approximately 7.5 x 6.4 cm (series 6, image 61). It demonstrates mass effect on the RIGHT upper lobe bronchi and occludes them superiorly. Irregular soft tissue extends into the RIGHT middle and lower lobe with ill-defined soft tissue seen coursing along the RIGHT mainstem bronchus. Patchy LEFT basilar irregular centrilobular nodular opacities, nonspecific. Musculoskeletal: Infiltrative osseous metastatic disease is favored to involve every thoracic vertebral body and many of the visualized cervical vertebral bodies. Pathologic compression fracture deformity of T5. Lesions involve multiple bilateral ribs with scattered the pathologic fractures noted including the LEFT-sided fifth rib and RIGHT-sided eleventh rib. There are 13 thoracic type vertebral bodies. CT ABDOMEN PELVIS FINDINGS Hepatobiliary: There are multiple incompletely characterized hypoechoic masses with representative incompletely characterized hypoechoic mass of the RIGHT liver (series 3, image 75). These are incompletely characterized given lack of IV contrast. Gallbladder is unremarkable. Pancreas: No peripancreatic fat stranding. Spleen: Unremarkable. Adrenals/Urinary Tract: Fullness of the LEFT adrenal gland. No hydronephrosis. No obstructing nephrolithiasis. Bladder is unremarkable. Stomach/Bowel: No evidence of bowel obstruction. Moderate colonic stool burden predominately within the RIGHT  hemicolon. Appendix is normal. Vascular/Lymphatic: Atherosclerotic calcifications of the nonaneurysmal abdominal aorta. No definitive intra-abdominal adenopathy is identified in the limitations of this noncontrast exam. Reproductive: Prostate is present Other: No free air.  No free fluid. Musculoskeletal: Soft tissue mass involving the LEFT femoral neck and eroding the cortex anteriorly (series 4, image 310). It places patient at the risk for pathologic fracture. Infiltrative metastatic disease involving the LEFT acetabulum, pelvis, sacrum and all of the lumbar vertebral bodies. IMPRESSION: 1. Mass involving the majority of the RIGHT upper lobe with soft tissue extending along the RIGHT upper lobe and RIGHT mainstem bronchus, along the carina and into the AP window. This is concerning for primary lung malignancy. 2. Widespread osseous metastatic disease. Of note there is an osseous lesion involving the LEFT femoral head and neck cortex which places the patient at risk for impending pathologic fracture. Recommend consultation with Orthopedic surgery. 3. Moderate RIGHT pleural effusion, possibly malignant. 4. Multiple hypoechoic masses throughout the liver, incompletely assessed given lack of IV contrast. These are concerning for hepatic metastatic disease. 5. Fullness of the LEFT adrenal gland, likely adrenal metastatic disease. 6. There may be a postobstructive infectious or atelectatic component within the RIGHT upper lobe. Lymphangitic spread remains a differential consideration. 7. Scattered LEFT basilar centrilobular nodular opacities are nonspecific and could be infectious or inflammatory in etiology versus reflect additional metastatic disease. 8. Pathologic compression fracture of T5. These results were called by telephone at the time of interpretation on 07/14/2021 at 2:26 pm to provider Taquana Bartley , who verbally acknowledged these results. Electronically Signed   By: Valentino Saxon M.D.   On:  08/04/2021 14:39    Procedures Procedures    Medications Ordered in ED Medications  sodium chloride 0.9 % bolus 1,000 mL (0 mLs Intravenous Stopped 07/15/2021 1450)    ED Course/ Medical Decision Making/ A&P                           Medical Decision Making Amount and/or Complexity of Data Reviewed Radiology: ordered.  Risk Prescription drug management.   Patient presents with unexplained weight loss, fatigue and a dry cough.  Chest x-ray was performed which shows a right lung mass versus infiltrate.  This was interpreted by me and confirmed by the radiologist.  His labs are abnormal with a markedly elevated calcium, elevated alk phos  and worsening renal function.  His WBC count is markedly elevated.  His hemoglobin is slightly low.  He does not have a fever or other suggestions of infection.  His potassium is mildly elevated.  He was started on IV fluids for his hypercalcemia.  He had a CT scan of his abdomen pelvis which showed lung mass in his right lung with extension toward the mediastinum.  Also has multiple metastatic lesions in his abdomen and in his bones.  He does have a lesion in his left femoral neck which may need Ortho consult during his hospitalization.  I discussed the findings with Dr. Roosevelt Locks who will admit the patient for further treatment.  Final Clinical Impression(s) / ED Diagnoses Final diagnoses:  Metastatic malignant neoplasm, unspecified site [facility])  Lung mass  Hypercalcemia  AKI (acute kidney injury) [facility])    Rx / DC Orders ED Discharge Orders     None         Malvin Johns, MD 07/08/2021 1500    Malvin Johns, MD 07/23/2021 1501

## 2021-07-27 NOTE — ED Provider Triage Note (Signed)
Emergency Medicine Provider Triage Evaluation Note  Michael Fritz , a 56 y.o. male  was evaluated in triage.  Pt complains of abnormal labs.  He was seen by his doctor who sent him here due to elevated alk phos, white blood cell count, 13 pound weight loss over the past 3 months, unintentional.  Also reports that he had an abnormal chest x-ray and a right upper quadrant  Lower back pain.  Smokes a pack of cigarettes daily for 40 years  Review of Systems  Positive: As above  Negative: Chest pain or increased shortness of breath  Physical Exam  BP (!) 128/53 (BP Location: Left Arm)   Pulse 63   Temp 97.7 F (36.5 C) (Oral)   Resp 17   SpO2 90%  Gen:   Awake, no distress   Resp:  Normal effort  MSK:   Moves extremities without difficulty  Other:  Decreased lung sounds in the right upper lobes tenderness in paraspinals of the lumbar spine.  Nontender abdomen, normal heart sounds  Medical Decision Making  Medically screening exam initiated at 10:21 AM.  Appropriate orders placed.  HART HAAS was informed that the remainder of the evaluation will be completed by another provider, this initial triage assessment does not replace that evaluation, and the importance of remaining in the ED until their evaluation is complete.      Rhae Hammock, PA-C 07/26/2021 1025

## 2021-07-27 NOTE — Progress Notes (Signed)
Subjective:  Patient ID: Michael Fritz, male    DOB: [birth year]  Age: 56 y.o. MRN: [id number]  CC: Chief Complaint  Patient presents with   Back Pain    Has been going on for 2-3 weeks. Went to [place] and had been doing a lot of walk.   Leg Pain    HPI:  56 year old male with uncontrolled type 2 diabetes with proteinuria, tobacco abuse, hypertension, hyperlipidemia presents for evaluation the above.  Patient reports that he has had ongoing weight loss.  I have reviewed the electronic medical record and he is lost 12 pounds since his last visit.  Patient states that he feels like he is losing muscle mass.  Patient reports that he recently went on a trip and did a lot of walking.  He states that over the past week he has had worsening low back pain as well as bilateral hip pain.  Patient states that he finds it very difficult to move.  He is in discomfort and does not want to get on the exam table as he has difficulty doing so.  No reports of fever.  No relieving factors.  No other complaints.  Patient Active Problem List   Diagnosis Date Noted   Hypercalcemia 07/14/2021   AKI (acute kidney injury) ([place]) 07/16/2021   Lung mass 07/23/2021   Proteinuria due to type 2 diabetes mellitus ([place]) 01/14/2020   Peyronie's disease 04/15/2019   Essential hypertension 11/27/2017   Type 2 diabetes mellitus without complication ([place]) 29/93/7169   Hyperlipidemia associated with type 2 diabetes mellitus ([place]) 12/24/2015   Other male erectile dysfunction 12/24/2015   Smoker 12/24/2015   Adhesive capsulitis of right shoulder 01/17/2014    Social Hx   Social History   Socioeconomic History   Marital status: Married    Spouse name: Not on file   Number of children: Not on file   Years of education: Not on file   Highest education level: Not on file  Occupational History   Not on file  Tobacco Use   Smoking status: Every Day    Packs/day: 1.00    Types: Cigarettes   Smokeless tobacco:  Never  Substance and Sexual Activity   Alcohol use: Yes    Comment: occ   Drug use: No   Sexual activity: Not on file  Other Topics Concern   Not on file  Social History Narrative   Not on file   Social Determinants of Health   Financial Resource Strain: Not on file  Food Insecurity: Not on file  Transportation Needs: Not on file  Physical Activity: Not on file  Stress: Not on file  Social Connections: Not on file    Review of Systems Per HPI  Objective:  BP 139/68   Pulse 65   Temp 98.1 F (36.7 C) (Oral)   Ht 6' (1.829 m)   Wt 150 lb 9.6 oz (68.3 kg)   SpO2 99%   BMI 20.43 kg/m      07/26/2021    3:43 PM 04/28/2021    8:59 AM 04/28/2021    8:41 AM  BP/Weight  Systolic BP 678 938 101  Diastolic BP 68 78 74  Wt. (Lbs) 150.6  162.2  BMI 20.43 kg/m2  22 kg/m2    Physical Exam Vitals and nursing note reviewed.  Constitutional:      Comments: Thin male no acute distress  HENT:     Head: Normocephalic and atraumatic.  Cardiovascular:     Rate  and Rhythm: Normal rate and regular rhythm.  Pulmonary:     Effort: Pulmonary effort is normal. No respiratory distress.  Musculoskeletal:     Comments: Lumbar spine with diffuse tenderness.  Neurological:     Mental Status: He is alert.  Psychiatric:        Mood and Affect: Mood normal.        Behavior: Behavior normal.    Lab Results  Component Value Date   WBC 26.1 (HH) 07/26/2021   HGB 12.9 (L) 07/26/2021   HCT 39.2 07/26/2021   PLT 363 07/26/2021   GLUCOSE 144 (H) 07/26/2021   CHOL 150 07/26/2021   TRIG 104 07/26/2021   HDL 63 07/26/2021   LDLCALC 68 07/26/2021   ALT 57 (H) 07/26/2021   AST 46 (H) 07/26/2021   NA 134 07/26/2021   K 5.0 07/26/2021   CL 96 07/26/2021   CREATININE 1.87 (H) 07/26/2021   BUN 31 (H) 07/26/2021   CO2 25 07/26/2021   TSH 1.360 06/10/2017   HGBA1C 12.2 (H) 07/26/2021     Assessment & Plan:   Problem List Items Addressed This Visit       Endocrine    Hyperlipidemia associated with type 2 diabetes mellitus ([place])   Relevant Orders   Lipid panel (Completed)     Genitourinary   AKI (acute kidney injury) ([place])    Significant increase in BUN and creatinine.  Needs IV fluids.         Other   Lung mass    Chest x-ray was done (previously ordered by his primary care physician). Airspace disease in the right upper lobe which is concerning for possible malignancy.        Hypercalcemia - Primary    Laboratory studies were ordered.  Patient's labs returned early this morning.  He has severe hypercalcemia.  This is an emergency.  I have spoken with his primary care physician.  He is going to the hospital for further management and work-up. Additionally he has other laboratory abnormalities which are very concerning.  Elevated alkaline phosphatase, leukocytosis, low albumin.  Patient also has an acute kidney injury.       Other Visit Diagnoses     Type 2 diabetes mellitus with complications ([place])       Relevant Orders   CBC (Completed)   CMP14+EGFR (Completed)   Hemoglobin A1c (Completed)   Microalbumin / creatinine urine ratio (Completed)   Prostate cancer screening       Relevant Orders   PSA (Completed)   Acute bilateral low back pain without sciatica       Relevant Orders   DG Lumbar Spine Complete (Completed)   Bilateral hip pain       Relevant Orders   DG HIPS BILAT WITH PELVIS MIN 5 VIEWS (Completed)      Azaryah Heathcock Lacinda Axon DO [place]

## 2021-07-27 NOTE — Assessment & Plan Note (Signed)
Significant increase in BUN and creatinine.  Needs IV fluids.

## 2021-07-27 NOTE — Telephone Encounter (Signed)
Had phone conversation with patient Renal function elevated Alkaline phos severely elevated Calcium severely elevated White blood count severely elevated Chest x-ray was ordered back in February patient did not get chest x-ray until current Chest x-ray shows right upper lobe involvement Coupled with his weight loss over the past 3 months back pain hip pain fatigue tiredness Patient will need IV fluids Will need work-up of abnormalities More than likely needs scan of CT of abdomen and chest to rule out lung cancer or other potential cancers  Spoke with patient regarding the above, patient agrees to go to [facility] for further evaluation  Nurses-please call triage at [facility] let them know patient is coming that all his results are in epic

## 2021-07-27 NOTE — ED Notes (Signed)
Calcium 14.0

## 2021-07-27 NOTE — ED Notes (Signed)
Pt in room resting. NAD denies any pain at this time.

## 2021-07-27 NOTE — Assessment & Plan Note (Signed)
Chest x-ray was done (previously ordered by his primary care physician). Airspace disease in the right upper lobe which is concerning for possible malignancy.

## 2021-07-27 NOTE — ED Triage Notes (Signed)
PT. STATED, I WAS SENT HERE FOR ABNORMAL LABS AND FATIGUE AND DEHYDRATION FIR ABOUT A MONTH. IM HAVING RANDOMLY PAIN ALL OVER Wife stated, he sleeps a lot more than normal in the last month.

## 2021-07-27 NOTE — H&P (Addendum)
History and Physical    Michael Fritz FTD:[id number] DOB: 06-01-1965 DOA: 07/25/2021  PCP: Kathyrn Drown, MD (Confirm with patient/family/NH records and if not entered, this has to be entered at [facility] point of entry) Patient coming from: Home  I have personally briefly reviewed patient's old medical records in [place]  Chief Complaint: Feeling weak, SOB  HPI: Michael Fritz is a 56 y.o. male with medical history significant of cigarette smoker, HTN, IDDM, HLD, sent from PCPs office for abnormal lab.  Patient developed generalized weakness, and unintentional weight loss of 12 pounds over the last several months.  He also developed left-sided hip pain, worsening with ambulation.  Denies any chest pain abdominal pain, no headache no vision changes.  No constipation.  Yesterday, patient went to see PCP who ordered blood work showed calcium 12.9, and was told to come to ED this morning.  ED Course: Vital signs stable, no hypotension no tachycardia, breathing on room air O2 saturation 97% on room air, afebrile.  CT chest abdomen pelvis showed large lung mass involving right upper lobe invading the right mainstem bronchus, with large right-sided pleural effusion metastatic lesions involving left femoral neck with significant risk of impending pathological fracture.  Multiple hypoechogenic mass throughout the liver suspicious for metastasis and left adrenal lesion likely metastatic.  Repeat calcium 14.0, creatinine 1.8 compared to baseline 1.2, K5.0, bicarb 25 glucose 144 albumin 3.5  Review of Systems: As per HPI otherwise 14 point review of systems negative.    Past Medical History:  Diagnosis Date   Adhesive capsulitis of right shoulder 01/17/2014   Diabetes mellitus without complication ([place])    Hyperlipemia    Hypertension    Peyronie's disease 04/15/2019   Dr. Alyson Ingles urology   Wears dentures    top    Past Surgical History:  Procedure Laterality Date   CLOSED  MANIPULATION SHOULDER WITH STERIOD INJECTION Right 01/17/2014   Procedure: RIGHT SHOULDER MANIPULATION WITH CORTISONE INJECTION;  Surgeon: Johnny Bridge, MD;  Location: [place];  Service: Orthopedics;  Laterality: Right;   TONSILLECTOMY       reports that he has been smoking cigarettes. He has been smoking an average of 1 pack per day. He has never used smokeless tobacco. He reports current alcohol use. He reports that he does not use drugs.  Allergies  Allergen Reactions   Penicillins     childhood    No family history on file.   Prior to Admission medications   Medication Sig Start Date End Date Taking? Authorizing Provider  insulin glargine (LANTUS SOLOSTAR) 100 UNIT/ML Solostar Pen Take 50 - 55 units once daily Patient taking differently: Inject 50-55 Units into the skin daily. Per sliding scale 04/28/21  Yes Luking, Elayne Snare, MD  metFORMIN (GLUCOPHAGE XR) 500 MG 24 hr tablet 1 bid Patient taking differently: Take 500 mg by mouth 2 (two) times daily. 04/28/21  Yes Kathyrn Drown, MD  valsartan (DIOVAN) 160 MG tablet Take 1 tablet (160 mg total) by mouth daily. 04/28/21  Yes Luking, Elayne Snare, MD  AMBULATORY NON FORMULARY MEDICATION 0.2 mLs by Intracavernosal route as needed. Medication Name: Trimix  PGE 48mcg Pap 30mg  Phent 1mg  Patient not taking: Reported on 07/26/2021 01/13/21   Cleon Gustin, MD  atorvastatin (LIPITOR) 40 MG tablet Take 1 tablet (40 mg total) by mouth daily. Patient not taking: Reported on 07/22/2021 04/28/21   Kathyrn Drown, MD  Continuous Blood Gluc Sensor (FREESTYLE LIBRE 14  DAY SENSOR) MISC USE TO CHECK SUGARS 4 TIMES DAILY 06/28/21   Kathyrn Drown, MD  Insulin Pen Needle (BD PEN NEEDLE NANO U/F) 32G X 4 MM MISC three times a day 11/23/20   Kathyrn Drown, MD    Physical Exam: Vitals:   07/18/2021 1300 07/20/2021 1315 07/08/2021 1430 07/05/2021 1530  BP: 100/70 (!) 154/66 (!) 170/71 (!) 157/70  Pulse: 60 (!) 59 66 64  Resp: 12 17 16 16    Temp:      TempSrc:      SpO2: 98% 93% 96% 97%  Weight:      Height:        Constitutional: NAD, calm, comfortable Vitals:   07/22/2021 1300 07/15/2021 1315 07/10/2021 1430 07/28/2021 1530  BP: 100/70 (!) 154/66 (!) 170/71 (!) 157/70  Pulse: 60 (!) 59 66 64  Resp: 12 17 16 16   Temp:      TempSrc:      SpO2: 98% 93% 96% 97%  Weight:      Height:       Eyes: PERRL, lids and conjunctivae normal ENMT: Mucous membranes are moist. Posterior pharynx clear of any exudate or lesions.Normal dentition.  Neck: normal, supple, no masses, no thyromegaly Respiratory: Diminished breathing sound on the right side, no wheezing, no crackles. Normal respiratory effort. No accessory muscle use.  Cardiovascular: Regular rate and rhythm, no murmurs / rubs / gallops. No extremity edema. 2+ pedal pulses. No carotid bruits.  Abdomen: no tenderness, no masses palpated. No hepatosplenomegaly. Bowel sounds positive.  Musculoskeletal: Left hip tenderness skin: no rashes, lesions, ulcers. No induration Neurologic: CN 2-12 grossly intact. Sensation intact, DTR normal. Strength 5/5 in all 4.  Psychiatric: Normal judgment and insight. Alert and oriented x 3. Normal mood.     Labs on Admission: I have personally reviewed following labs and imaging studies  CBC: Recent Labs  Lab 07/26/21 1632 07/16/2021 1021  WBC 26.1* 20.1*  NEUTROABS  --  16.8*  HGB 12.9* 10.9*  HCT 39.2 33.6*  MCV 88 90.8  PLT 363 891   Basic Metabolic Panel: Recent Labs  Lab 07/26/21 1632 07/13/2021 1021  NA 134 132*  K 5.0 5.3*  CL 96 99  CO2 25 25  GLUCOSE 144* 318*  BUN 31* 38*  CREATININE 1.87* 2.28*  CALCIUM 14.9* 14.0*   GFR: Estimated Creatinine Clearance: 34.4 mL/min (A) (by C-G formula based on SCr of 2.28 mg/dL (H)). Liver Function Tests: Recent Labs  Lab 07/26/21 1632 07/31/2021 1021  AST 46* 32  ALT 57* 38  ALKPHOS 866* 570*  BILITOT 0.3 0.8  PROT 7.0 5.8*  ALBUMIN 3.5* 2.4*   No results for input(s): LIPASE,  AMYLASE in the last 168 hours. No results for input(s): AMMONIA in the last 168 hours. Coagulation Profile: No results for input(s): INR, PROTIME in the last 168 hours. Cardiac Enzymes: No results for input(s): CKTOTAL, CKMB, CKMBINDEX, TROPONINI in the last 168 hours. BNP (last 3 results) No results for input(s): PROBNP in the last 8760 hours. HbA1C: Recent Labs    07/26/21 1632  HGBA1C 12.2*   CBG: No results for input(s): GLUCAP in the last 168 hours. Lipid Profile: Recent Labs    07/26/21 1632  CHOL 150  HDL 63  LDLCALC 68  TRIG 104  CHOLHDL 2.4   Thyroid Function Tests: No results for input(s): TSH, T4TOTAL, FREET4, T3FREE, THYROIDAB in the last 72 hours. Anemia Panel: No results for input(s): VITAMINB12, FOLATE, FERRITIN, TIBC, IRON, RETICCTPCT in the  last 72 hours. Urine analysis: No results found for: COLORURINE, APPEARANCEUR, LABSPEC, PHURINE, GLUCOSEU, HGBUR, BILIRUBINUR, KETONESUR, PROTEINUR, UROBILINOGEN, NITRITE, LEUKOCYTESUR  Radiological Exams on Admission: DG Chest 2 View  Result Date: 07/23/2021 CLINICAL DATA:  Dyspnea EXAM: CHEST - 2 VIEW COMPARISON:  Chest radiograph from one day prior. FINDINGS: Stable cardiomediastinal silhouette with normal heart size. No pneumothorax. Stable trace bilateral pleural effusions. Dense right upper lobe consolidation is stable. Clear left lung. IMPRESSION: 1. Stable dense right upper lobe consolidation compatible with pneumonia. Underlying obstructing right upper lung lesion not excluded. Continued follow-up chest radiographs recommended at a minimum to document resolution. Chest CT with IV contrast may be considered. 2. Stable trace bilateral pleural effusions. Electronically Signed   By: Ilona Sorrel M.D.   On: 07/30/2021 10:56   DG Chest 2 View  Result Date: 07/26/2021 CLINICAL DATA:  Cough EXAM: CHEST - 2 VIEW COMPARISON:  Report 05/13/2011 FINDINGS: Small bilateral pleural effusions. Airspace disease in the right upper  lobe. Normal cardiac size. No pneumothorax. IMPRESSION: 1. Airspace disease in the right upper lobe could be due to pneumonia but pulmonary mass or central lesion is also possible. Consider correlation with contrast-enhanced chest CT 2. Small bilateral effusions Electronically Signed   By: Donavan Foil M.D.   On: 07/26/2021 17:18   DG Lumbar Spine Complete  Result Date: 07/26/2021 CLINICAL DATA:  Provided history: Low back pain. Additional history provided: Cough since February, low back pain, bilateral hip pain. EXAM: LUMBAR SPINE - COMPLETE 4+ VIEW COMPARISON:  No pertinent prior exams available for comparison. FINDINGS: Five lumbar vertebrae. The caudal most well-formed intervertebral disc space is designated L5-S1. Minimal grade 1 retrolisthesis at T12-L1, L1-L2, L2-L3, L3-L4 and L5-S1. No lumbar vertebral compression fracture. No more than mild disc space narrowing within the lumbar spine. Facet arthrosis, greatest at L4-L5 and L5-S1. Large stool burden within the colon and rectum. Aortic atherosclerosis. IMPRESSION: No lumbar vertebral compression fracture. Minimal grade 1 retrolisthesis at T12-L1, L1-L2, L2-L3, L3-L4 and L5-S1. Lumbar spondylosis, as described. Large stool burden within the colon and rectum, suggesting constipation. Aortic Atherosclerosis (ICD10-I70.0). Electronically Signed   By: Kellie Simmering D.O.   On: 07/26/2021 17:18   DG HIPS BILAT WITH PELVIS MIN 5 VIEWS  Result Date: 07/26/2021 CLINICAL DATA:  Hip pain EXAM: DG HIP (WITH OR WITHOUT PELVIS) 5+V BILAT COMPARISON:  None Available. FINDINGS: No fracture or malalignment. SI joints are patent. Pubic symphysis is intact. There are mild degenerative changes of both hips. IMPRESSION: Mild degenerative changes.  No acute osseous abnormality Electronically Signed   By: Donavan Foil M.D.   On: 07/26/2021 17:17   CT CHEST ABDOMEN PELVIS WO CONTRAST  Result Date: 07/12/2021 CLINICAL DATA:  lung mass, weight loss, lumbar/hip pain EXAM:  CT CHEST, ABDOMEN AND PELVIS WITHOUT CONTRAST TECHNIQUE: Multidetector CT imaging of the chest, abdomen and pelvis was performed following the standard protocol without IV contrast. RADIATION DOSE REDUCTION: This exam was performed according to the departmental dose-optimization program which includes automated exposure control, adjustment of the mA and/or kV according to patient size and/or use of iterative reconstruction technique. COMPARISON:  None Available. FINDINGS: Evaluation is limited with lack of IV contrast. CT CHEST FINDINGS Cardiovascular: Heart is the upper limits of normal in size. Mild LEFT-sided coronary artery atherosclerotic calcifications. Small pericardial effusion. Aorta is normal in course and caliber. Scattered atherosclerotic calcifications. Mediastinum/Nodes: Visualized thyroid is unremarkable. Mildly prominent LEFT subpectoral axillary lymph node measures 9 mm in the short axis (series 4, image  51). There is mediastinal adenopathy. Representative rounded precarinal lymph node measures 11 mm (series 4, image 74). Evaluation for hilar adenopathy is limited secondary to lack of IV contrast. Ill-defined soft tissue encases the RIGHT mainstem bronchus and extends inferior to the carina and along the course of the trachea. This ill-defined soft tissue is seen within the AP window. Lungs/Pleura: Moderate RIGHT pleural effusion. Trace LEFT pleural effusion. Irregular consolidative opacity throughout the majority of the RIGHT upper lobe with interlobular septal thickening. Masslike consolidation centrally measures approximately 7.5 x 6.4 cm (series 6, image 61). It demonstrates mass effect on the RIGHT upper lobe bronchi and occludes them superiorly. Irregular soft tissue extends into the RIGHT middle and lower lobe with ill-defined soft tissue seen coursing along the RIGHT mainstem bronchus. Patchy LEFT basilar irregular centrilobular nodular opacities, nonspecific. Musculoskeletal: Infiltrative  osseous metastatic disease is favored to involve every thoracic vertebral body and many of the visualized cervical vertebral bodies. Pathologic compression fracture deformity of T5. Lesions involve multiple bilateral ribs with scattered the pathologic fractures noted including the LEFT-sided fifth rib and RIGHT-sided eleventh rib. There are 13 thoracic type vertebral bodies. CT ABDOMEN PELVIS FINDINGS Hepatobiliary: There are multiple incompletely characterized hypoechoic masses with representative incompletely characterized hypoechoic mass of the RIGHT liver (series 3, image 75). These are incompletely characterized given lack of IV contrast. Gallbladder is unremarkable. Pancreas: No peripancreatic fat stranding. Spleen: Unremarkable. Adrenals/Urinary Tract: Fullness of the LEFT adrenal gland. No hydronephrosis. No obstructing nephrolithiasis. Bladder is unremarkable. Stomach/Bowel: No evidence of bowel obstruction. Moderate colonic stool burden predominately within the RIGHT hemicolon. Appendix is normal. Vascular/Lymphatic: Atherosclerotic calcifications of the nonaneurysmal abdominal aorta. No definitive intra-abdominal adenopathy is identified in the limitations of this noncontrast exam. Reproductive: Prostate is present Other: No free air.  No free fluid. Musculoskeletal: Soft tissue mass involving the LEFT femoral neck and eroding the cortex anteriorly (series 4, image 310). It places patient at the risk for pathologic fracture. Infiltrative metastatic disease involving the LEFT acetabulum, pelvis, sacrum and all of the lumbar vertebral bodies. IMPRESSION: 1. Mass involving the majority of the RIGHT upper lobe with soft tissue extending along the RIGHT upper lobe and RIGHT mainstem bronchus, along the carina and into the AP window. This is concerning for primary lung malignancy. 2. Widespread osseous metastatic disease. Of note there is an osseous lesion involving the LEFT femoral head and neck cortex which  places the patient at risk for impending pathologic fracture. Recommend consultation with Orthopedic surgery. 3. Moderate RIGHT pleural effusion, possibly malignant. 4. Multiple hypoechoic masses throughout the liver, incompletely assessed given lack of IV contrast. These are concerning for hepatic metastatic disease. 5. Fullness of the LEFT adrenal gland, likely adrenal metastatic disease. 6. There may be a postobstructive infectious or atelectatic component within the RIGHT upper lobe. Lymphangitic spread remains a differential consideration. 7. Scattered LEFT basilar centrilobular nodular opacities are nonspecific and could be infectious or inflammatory in etiology versus reflect additional metastatic disease. 8. Pathologic compression fracture of T5. These results were called by telephone at the time of interpretation on 07/13/2021 at 2:26 pm to provider MELANIE BELFI , who verbally acknowledged these results. Electronically Signed   By: Valentino Saxon M.D.   On: 07/16/2021 14:39    EKG: Independently reviewed.  Sinus, no acute ST changes  Assessment/Plan Principal Problem:   AKI (acute kidney injury) ([place]) Active Problems:   Hypercalcemia   Lung mass  (please populate well all problems here in Problem List. (For example, if patient  is on BP meds at home and you resume or decide to hold them, it is a problem that needs to be her. Same for CAD, COPD, HLD and so on)  Hypercalcemia -Severe -Likely secondary to PTH like peptide secretion secondary to metastatic lung mass. -Discussed with on-call oncology Dr. Alen Blew, agreed with aggressive hydration, steroid, calcitonin, consult pharmacy for biphosphate.  And if kidney function stabilized tomorrow, consider diuresis.  Metastatic lung cancer -Likely stage IV, delivered message to patient and his wife at bedside. -Discussed with oncology Dr. Alen Blew, who recommend IR guided thoracentesis and cytology/pathology evaluation and oncology will follow  from background to decide further treatment plan.  Explained to the patient and family both expressed understanding and agreed. -Outpatient brain MRI  AKI -Likely from hypercalcemia -Given the extensiveness of the cancer, check uric acid level to rule out tumor lysis -No significant urinary obstruction on CAT scan -Hydration and reevaluate -Hold off metformin and ARB  Impending left hip pathological fracture -Discussed with on-call orthopedic surgery Dr. Doran Durand -NPO after midnight, hold off chemical DVT prophylaxis, orthopedic surgery plans for nail and plate for left femoral neck tomorrow. -Dilaudid for pain -Nonweightbearing of left leg for now  HTN -Hold off ARB, as needed hydralazine  IDDM -Hold off long-acting insulin given there is a AKI  Cigarette smoke -Cessation education consulted at bedside -Nicotine patch for now  DVT prophylaxis: SCD Code Status: Full code Family Communication: Wife at bedside Disposition Plan: Patient sick with severe hypercalcemia needs aggressive treatment, impending left hip fracture need surgical intervention, expect more than 2 midnight hospital stay. Consults called: Oncology, IR, orthopedic surgery Admission status: MedSurg admission   Lequita Halt MD [facility] Pager [phone]  07/24/2021, 4:33 PM

## 2021-07-27 NOTE — Telephone Encounter (Signed)
Information given to ER triage nurse at [facility] ER

## 2021-07-27 NOTE — Consult Note (Signed)
Reason for Consult:left hip pain Referring Physician: Dr. Billee Cashing is an 56 y.o. male.  HPI: 56 y/o male with PMH of diabetes and cigarette smoking c/o L hip pain with ambulation worsening over the last couple of weeks.  He recently travelled to [place] and did a lot of walking.  He didn't have as much hip pain then.  He points to his left groin where the pain is worst.  No h/o hip surgery or injury.  Pt has been feeling weak and tired for the last few weeks and has lost weight.  Also with some SOB.  He was admitted from the ER today for hypercalcemia.  CT of chest, abdomen and pelvis reveal lung mass and metatastatic lesions in the pelvis, spine and a left femoral neck.  Also lesions in the liver and adrenal gland that are likely metastatic.  He is scheduled for IR guided plueracentesis tomorrow.  He does not take any blood thinners.  He smokes a pack of cigarettes daily.  Wife is at bedside and provides history as well.  No blood thinners currently.  Past Medical History:  Diagnosis Date   Adhesive capsulitis of right shoulder 01/17/2014   Diabetes mellitus without complication ([place])    Hyperlipemia    Hypertension    Peyronie's disease 04/15/2019   Dr. Alyson Ingles urology   Wears dentures    top    Past Surgical History:  Procedure Laterality Date   CLOSED MANIPULATION SHOULDER WITH STERIOD INJECTION Right 01/17/2014   Procedure: RIGHT SHOULDER MANIPULATION WITH CORTISONE INJECTION;  Surgeon: Johnny Bridge, MD;  Location: [facility];  Service: Orthopedics;  Laterality: Right;   TONSILLECTOMY      No family history on file.  Social History:  reports that he has been smoking cigarettes. He has been smoking an average of 1 pack per day. He has never used smokeless tobacco. He reports current alcohol use. He reports that he does not use drugs.  Allergies:  Allergies  Allergen Reactions   Penicillins     childhood    Medications: I have reviewed the  patient's current medications.  Results for orders placed or performed during the hospital encounter of 07/11/2021 (from the past 48 hour(s))  CBC with Differential     Status: Abnormal   Collection Time: 08/01/2021 10:21 AM  Result Value Ref Range   WBC 20.1 (H) 4.0 - 10.5 K/uL   RBC 3.70 (L) 4.22 - 5.81 MIL/uL   Hemoglobin 10.9 (L) 13.0 - 17.0 g/dL   HCT 33.6 (L) 39.0 - 52.0 %   MCV 90.8 80.0 - 100.0 fL   MCH 29.5 26.0 - 34.0 pg   MCHC 32.4 30.0 - 36.0 g/dL   RDW 13.2 11.5 - 15.5 %   Platelets 269 150 - 400 K/uL   nRBC 0.0 0.0 - 0.2 %   Neutrophils Relative % 85 %   Neutro Abs 16.8 (H) 1.7 - 7.7 K/uL   Lymphocytes Relative 6 %   Lymphs Abs 1.2 0.7 - 4.0 K/uL   Monocytes Relative 8 %   Monocytes Absolute 1.6 (H) 0.1 - 1.0 K/uL   Eosinophils Relative 0 %   Eosinophils Absolute 0.1 0.0 - 0.5 K/uL   Basophils Relative 0 %   Basophils Absolute 0.1 0.0 - 0.1 K/uL   Immature Granulocytes 1 %   Abs Immature Granulocytes 0.28 (H) 0.00 - 0.07 K/uL    Comment: Performed at [facility], [address]  [place], [place] [address]  Comprehensive metabolic panel     Status: Abnormal   Collection Time: 08/01/2021 10:21 AM  Result Value Ref Range   Sodium 132 (L) 135 - 145 mmol/L   Potassium 5.3 (H) 3.5 - 5.1 mmol/L   Chloride 99 98 - 111 mmol/L   CO2 25 22 - 32 mmol/L   Glucose, Bld 318 (H) 70 - 99 mg/dL    Comment: Glucose reference range applies only to samples taken after fasting for at least 8 hours.   BUN 38 (H) 6 - 20 mg/dL   Creatinine, Ser 2.28 (H) 0.61 - 1.24 mg/dL   Calcium 14.0 (HH) 8.9 - 10.3 mg/dL    Comment: REPEATED TO VERIFY CRITICAL RESULT CALLED TO, READ BACK BY AND VERIFIED WITH: S,COOKE RN @1159  07/18/2021 E,[place]    Total Protein 5.8 (L) 6.5 - 8.1 g/dL   Albumin 2.4 (L) 3.5 - 5.0 g/dL   AST 32 15 - 41 U/L   ALT 38 0 - 44 U/L   Alkaline Phosphatase 570 (H) 38 - 126 U/L   Total Bilirubin 0.8 0.3 - 1.2 mg/dL   GFR, Estimated 33 (L) >60 mL/min    Comment:  (NOTE) Calculated using the CKD-EPI Creatinine Equation (2021)    Anion gap 8 5 - 15    Comment: Performed at [facility], [place] [address] [place], [place] [address]  CBG monitoring, ED     Status: Abnormal   Collection Time: 08/04/2021  5:32 PM  Result Value Ref Range   Glucose-Capillary 369 (H) 70 - 99 mg/dL    Comment: Glucose reference range applies only to samples taken after fasting for at least 8 hours.  HIV Antibody (routine testing w rflx)     Status: None   Collection Time: 07/30/2021  7:52 PM  Result Value Ref Range   HIV Screen 4th Generation wRfx Non Reactive Non Reactive    Comment: Performed at [facility], [place] [address], [place], [place] [address]  Uric acid     Status: None   Collection Time: 08/01/2021  7:52 PM  Result Value Ref Range   Uric Acid, Serum 6.1 3.7 - 8.6 mg/dL    Comment: Performed at [facility] [address] [place],  [address]  CK     Status: Abnormal   Collection Time: 07/06/2021  7:52 PM  Result Value Ref Range   Total CK 29 (L) 49 - 397 U/L    Comment: Performed at [facility], [place] [address] [place],  [address]    DG Chest 2 View  Result Date: 07/05/2021 CLINICAL DATA:  Dyspnea EXAM: CHEST - 2 VIEW COMPARISON:  Chest radiograph from one day prior. FINDINGS: Stable cardiomediastinal silhouette with normal heart size. No pneumothorax. Stable trace bilateral pleural effusions. Dense right upper lobe consolidation is stable. Clear left lung. IMPRESSION: 1. Stable dense right upper lobe consolidation compatible with pneumonia. Underlying obstructing right upper lung lesion not excluded. Continued follow-up chest radiographs recommended at a minimum to document resolution. Chest CT with IV contrast may be considered. 2. Stable trace bilateral pleural effusions. Electronically Signed   By: Ilona Sorrel M.D.   On: 07/23/2021 10:56   DG Chest 2 View  Result Date: 07/26/2021 CLINICAL DATA:  Cough EXAM: CHEST - 2 VIEW  COMPARISON:  Report 05/13/2011 FINDINGS: Small bilateral pleural effusions. Airspace disease in the right upper lobe. Normal cardiac size. No pneumothorax. IMPRESSION: 1. Airspace disease in the  right upper lobe could be due to pneumonia but pulmonary mass or central lesion is also possible. Consider correlation with contrast-enhanced chest CT 2. Small bilateral effusions Electronically Signed   By: Donavan Foil M.D.   On: 07/26/2021 17:18   DG Lumbar Spine Complete  Result Date: 07/26/2021 CLINICAL DATA:  Provided history: Low back pain. Additional history provided: Cough since February, low back pain, bilateral hip pain. EXAM: LUMBAR SPINE - COMPLETE 4+ VIEW COMPARISON:  No pertinent prior exams available for comparison. FINDINGS: Five lumbar vertebrae. The caudal most well-formed intervertebral disc space is designated L5-S1. Minimal grade 1 retrolisthesis at T12-L1, L1-L2, L2-L3, L3-L4 and L5-S1. No lumbar vertebral compression fracture. No more than mild disc space narrowing within the lumbar spine. Facet arthrosis, greatest at L4-L5 and L5-S1. Large stool burden within the colon and rectum. Aortic atherosclerosis. IMPRESSION: No lumbar vertebral compression fracture. Minimal grade 1 retrolisthesis at T12-L1, L1-L2, L2-L3, L3-L4 and L5-S1. Lumbar spondylosis, as described. Large stool burden within the colon and rectum, suggesting constipation. Aortic Atherosclerosis (ICD10-I70.0). Electronically Signed   By: Kellie Simmering D.O.   On: 07/26/2021 17:18   DG HIPS BILAT WITH PELVIS MIN 5 VIEWS  Result Date: 07/26/2021 CLINICAL DATA:  Hip pain EXAM: DG HIP (WITH OR WITHOUT PELVIS) 5+V BILAT COMPARISON:  None Available. FINDINGS: No fracture or malalignment. SI joints are patent. Pubic symphysis is intact. There are mild degenerative changes of both hips. IMPRESSION: Mild degenerative changes.  No acute osseous abnormality Electronically Signed   By: Donavan Foil M.D.   On: 07/26/2021 17:17   CT CHEST  ABDOMEN PELVIS WO CONTRAST  Result Date: 08/04/2021 CLINICAL DATA:  lung mass, weight loss, lumbar/hip pain EXAM: CT CHEST, ABDOMEN AND PELVIS WITHOUT CONTRAST TECHNIQUE: Multidetector CT imaging of the chest, abdomen and pelvis was performed following the standard protocol without IV contrast. RADIATION DOSE REDUCTION: This exam was performed according to the departmental dose-optimization program which includes automated exposure control, adjustment of the mA and/or kV according to patient size and/or use of iterative reconstruction technique. COMPARISON:  None Available. FINDINGS: Evaluation is limited with lack of IV contrast. CT CHEST FINDINGS Cardiovascular: Heart is the upper limits of normal in size. Mild LEFT-sided coronary artery atherosclerotic calcifications. Small pericardial effusion. Aorta is normal in course and caliber. Scattered atherosclerotic calcifications. Mediastinum/Nodes: Visualized thyroid is unremarkable. Mildly prominent LEFT subpectoral axillary lymph node measures 9 mm in the short axis (series 4, image 51). There is mediastinal adenopathy. Representative rounded precarinal lymph node measures 11 mm (series 4, image 74). Evaluation for hilar adenopathy is limited secondary to lack of IV contrast. Ill-defined soft tissue encases the RIGHT mainstem bronchus and extends inferior to the carina and along the course of the trachea. This ill-defined soft tissue is seen within the AP window. Lungs/Pleura: Moderate RIGHT pleural effusion. Trace LEFT pleural effusion. Irregular consolidative opacity throughout the majority of the RIGHT upper lobe with interlobular septal thickening. Masslike consolidation centrally measures approximately 7.5 x 6.4 cm (series 6, image 61). It demonstrates mass effect on the RIGHT upper lobe bronchi and occludes them superiorly. Irregular soft tissue extends into the RIGHT middle and lower lobe with ill-defined soft tissue seen coursing along the RIGHT mainstem  bronchus. Patchy LEFT basilar irregular centrilobular nodular opacities, nonspecific. Musculoskeletal: Infiltrative osseous metastatic disease is favored to involve every thoracic vertebral body and many of the visualized cervical vertebral bodies. Pathologic compression fracture deformity of T5. Lesions involve multiple bilateral ribs with scattered the pathologic fractures noted including  the LEFT-sided fifth rib and RIGHT-sided eleventh rib. There are 13 thoracic type vertebral bodies. CT ABDOMEN PELVIS FINDINGS Hepatobiliary: There are multiple incompletely characterized hypoechoic masses with representative incompletely characterized hypoechoic mass of the RIGHT liver (series 3, image 75). These are incompletely characterized given lack of IV contrast. Gallbladder is unremarkable. Pancreas: No peripancreatic fat stranding. Spleen: Unremarkable. Adrenals/Urinary Tract: Fullness of the LEFT adrenal gland. No hydronephrosis. No obstructing nephrolithiasis. Bladder is unremarkable. Stomach/Bowel: No evidence of bowel obstruction. Moderate colonic stool burden predominately within the RIGHT hemicolon. Appendix is normal. Vascular/Lymphatic: Atherosclerotic calcifications of the nonaneurysmal abdominal aorta. No definitive intra-abdominal adenopathy is identified in the limitations of this noncontrast exam. Reproductive: Prostate is present Other: No free air.  No free fluid. Musculoskeletal: Soft tissue mass involving the LEFT femoral neck and eroding the cortex anteriorly (series 4, image 310). It places patient at the risk for pathologic fracture. Infiltrative metastatic disease involving the LEFT acetabulum, pelvis, sacrum and all of the lumbar vertebral bodies. IMPRESSION: 1. Mass involving the majority of the RIGHT upper lobe with soft tissue extending along the RIGHT upper lobe and RIGHT mainstem bronchus, along the carina and into the AP window. This is concerning for primary lung malignancy. 2. Widespread  osseous metastatic disease. Of note there is an osseous lesion involving the LEFT femoral head and neck cortex which places the patient at risk for impending pathologic fracture. Recommend consultation with Orthopedic surgery. 3. Moderate RIGHT pleural effusion, possibly malignant. 4. Multiple hypoechoic masses throughout the liver, incompletely assessed given lack of IV contrast. These are concerning for hepatic metastatic disease. 5. Fullness of the LEFT adrenal gland, likely adrenal metastatic disease. 6. There may be a postobstructive infectious or atelectatic component within the RIGHT upper lobe. Lymphangitic spread remains a differential consideration. 7. Scattered LEFT basilar centrilobular nodular opacities are nonspecific and could be infectious or inflammatory in etiology versus reflect additional metastatic disease. 8. Pathologic compression fracture of T5. These results were called by telephone at the time of interpretation on 07/28/2021 at 2:26 pm to provider MELANIE BELFI , who verbally acknowledged these results. Electronically Signed   By: Valentino Saxon M.D.   On: 08/03/2021 14:39    ROS:  as above.  10 system review o/w negative PE:  Blood pressure (!) 165/67, pulse 66, temperature 98.5 F (36.9 C), temperature source Oral, resp. rate 17, height 6' (1.829 m), weight 68 kg, SpO2 96 %. 43 wd male in nad.  A and O x 4.  Normal mood and affect.  EOMI.  Resp unlabored.  L LE with palpable pulses.  Pain with log roll.  5/5 strength in PF and DF of the ankle and toes.  Intact sens to LT at the forefoot dorsally and plantarly.  No lympadenopathy at the knee.  Assessment/Plan: Impending pathologic fracture of the left femoral neck - I explained the nature of the left hip pain, left femoral neck lesion and impending pathologic fracture to the patient and his wife in detail.  He will need prophylactic nailing of the left femur with a cepholemedullary device in the near future.  Keep NPO and hold  blood thinners for now.  We will work on scheduling surgery as quickly as possible.  In the meantime, NWB on the L LE.  Wylene Simmer 07/15/2021, 11:25 PM

## 2021-07-27 NOTE — H&P (View-Only) (Signed)
Reason for Consult:left hip pain Referring Physician: Dr. Billee Cashing is an 56 y.o. male.  HPI: 56 y/o male with PMH of diabetes and cigarette smoking c/o L hip pain with ambulation worsening over the last couple of weeks.  He recently travelled to [place] and did a lot of walking.  He didn't have as much hip pain then.  He points to his left groin where the pain is worst.  No h/o hip surgery or injury.  Pt has been feeling weak and tired for the last few weeks and has lost weight.  Also with some SOB.  He was admitted from the ER today for hypercalcemia.  CT of chest, abdomen and pelvis reveal lung mass and metatastatic lesions in the pelvis, spine and a left femoral neck.  Also lesions in the liver and adrenal gland that are likely metastatic.  He is scheduled for IR guided plueracentesis tomorrow.  He does not take any blood thinners.  He smokes a pack of cigarettes daily.  Wife is at bedside and provides history as well.  No blood thinners currently.  Past Medical History:  Diagnosis Date   Adhesive capsulitis of right shoulder 01/17/2014   Diabetes mellitus without complication ([facility])    Hyperlipemia    Hypertension    Peyronie's disease 04/15/2019   Dr. Alyson Ingles urology   Wears dentures    top    Past Surgical History:  Procedure Laterality Date   CLOSED MANIPULATION SHOULDER WITH STERIOD INJECTION Right 01/17/2014   Procedure: RIGHT SHOULDER MANIPULATION WITH CORTISONE INJECTION;  Surgeon: Johnny Bridge, MD;  Location: [facility];  Service: Orthopedics;  Laterality: Right;   TONSILLECTOMY      No family history on file.  Social History:  reports that he has been smoking cigarettes. He has been smoking an average of 1 pack per day. He has never used smokeless tobacco. He reports current alcohol use. He reports that he does not use drugs.  Allergies:  Allergies  Allergen Reactions   Penicillins     childhood    Medications: I have reviewed the  patient's current medications.  Results for orders placed or performed during the hospital encounter of 07/17/2021 (from the past 48 hour(s))  CBC with Differential     Status: Abnormal   Collection Time: 07/09/2021 10:21 AM  Result Value Ref Range   WBC 20.1 (H) 4.0 - 10.5 K/uL   RBC 3.70 (L) 4.22 - 5.81 MIL/uL   Hemoglobin 10.9 (L) 13.0 - 17.0 g/dL   HCT 33.6 (L) 39.0 - 52.0 %   MCV 90.8 80.0 - 100.0 fL   MCH 29.5 26.0 - 34.0 pg   MCHC 32.4 30.0 - 36.0 g/dL   RDW 13.2 11.5 - 15.5 %   Platelets 269 150 - 400 K/uL   nRBC 0.0 0.0 - 0.2 %   Neutrophils Relative % 85 %   Neutro Abs 16.8 (H) 1.7 - 7.7 K/uL   Lymphocytes Relative 6 %   Lymphs Abs 1.2 0.7 - 4.0 K/uL   Monocytes Relative 8 %   Monocytes Absolute 1.6 (H) 0.1 - 1.0 K/uL   Eosinophils Relative 0 %   Eosinophils Absolute 0.1 0.0 - 0.5 K/uL   Basophils Relative 0 %   Basophils Absolute 0.1 0.0 - 0.1 K/uL   Immature Granulocytes 1 %   Abs Immature Granulocytes 0.28 (H) 0.00 - 0.07 K/uL    Comment: Performed at [facility], [address]  [place], [place] [address]  Comprehensive metabolic panel     Status: Abnormal   Collection Time: 08/04/2021 10:21 AM  Result Value Ref Range   Sodium 132 (L) 135 - 145 mmol/L   Potassium 5.3 (H) 3.5 - 5.1 mmol/L   Chloride 99 98 - 111 mmol/L   CO2 25 22 - 32 mmol/L   Glucose, Bld 318 (H) 70 - 99 mg/dL    Comment: Glucose reference range applies only to samples taken after fasting for at least 8 hours.   BUN 38 (H) 6 - 20 mg/dL   Creatinine, Ser 2.28 (H) 0.61 - 1.24 mg/dL   Calcium 14.0 (HH) 8.9 - 10.3 mg/dL    Comment: REPEATED TO VERIFY CRITICAL RESULT CALLED TO, READ BACK BY AND VERIFIED WITH: S,COOKE RN @1159  07/12/2021 E,BENTON    Total Protein 5.8 (L) 6.5 - 8.1 g/dL   Albumin 2.4 (L) 3.5 - 5.0 g/dL   AST 32 15 - 41 U/L   ALT 38 0 - 44 U/L   Alkaline Phosphatase 570 (H) 38 - 126 U/L   Total Bilirubin 0.8 0.3 - 1.2 mg/dL   GFR, Estimated 33 (L) >60 mL/min    Comment:  (NOTE) Calculated using the CKD-EPI Creatinine Equation (2021)    Anion gap 8 5 - 15    Comment: Performed at [facility], [place] [address], [place], [place] [address]  CBG monitoring, ED     Status: Abnormal   Collection Time: 07/12/2021  5:32 PM  Result Value Ref Range   Glucose-Capillary 369 (H) 70 - 99 mg/dL    Comment: Glucose reference range applies only to samples taken after fasting for at least 8 hours.  HIV Antibody (routine testing w rflx)     Status: None   Collection Time: 08/01/2021  7:52 PM  Result Value Ref Range   HIV Screen 4th Generation wRfx Non Reactive Non Reactive    Comment: Performed at [facility], [place] [address] [place], [place] [address]  Uric acid     Status: None   Collection Time: 07/05/2021  7:52 PM  Result Value Ref Range   Uric Acid, Serum 6.1 3.7 - 8.6 mg/dL    Comment: Performed at [facility] [address], [place], [place] [address]  CK     Status: Abnormal   Collection Time: 07/31/2021  7:52 PM  Result Value Ref Range   Total CK 29 (L) 49 - 397 U/L    Comment: Performed at [facility], [place] [address], [place], [place] [address]    DG Chest 2 View  Result Date: 07/08/2021 CLINICAL DATA:  Dyspnea EXAM: CHEST - 2 VIEW COMPARISON:  Chest radiograph from one day prior. FINDINGS: Stable cardiomediastinal silhouette with normal heart size. No pneumothorax. Stable trace bilateral pleural effusions. Dense right upper lobe consolidation is stable. Clear left lung. IMPRESSION: 1. Stable dense right upper lobe consolidation compatible with pneumonia. Underlying obstructing right upper lung lesion not excluded. Continued follow-up chest radiographs recommended at a minimum to document resolution. Chest CT with IV contrast may be considered. 2. Stable trace bilateral pleural effusions. Electronically Signed   By: Ilona Sorrel M.D.   On: 07/10/2021 10:56   DG Chest 2 View  Result Date: 07/26/2021 CLINICAL DATA:  Cough EXAM: CHEST - 2 VIEW  COMPARISON:  Report 05/13/2011 FINDINGS: Small bilateral pleural effusions. Airspace disease in the right upper lobe. Normal cardiac size. No pneumothorax. IMPRESSION: 1. Airspace disease in the  right upper lobe could be due to pneumonia but pulmonary mass or central lesion is also possible. Consider correlation with contrast-enhanced chest CT 2. Small bilateral effusions Electronically Signed   By: Donavan Foil M.D.   On: 07/26/2021 17:18   DG Lumbar Spine Complete  Result Date: 07/26/2021 CLINICAL DATA:  Provided history: Low back pain. Additional history provided: Cough since February, low back pain, bilateral hip pain. EXAM: LUMBAR SPINE - COMPLETE 4+ VIEW COMPARISON:  No pertinent prior exams available for comparison. FINDINGS: Five lumbar vertebrae. The caudal most well-formed intervertebral disc space is designated L5-S1. Minimal grade 1 retrolisthesis at T12-L1, L1-L2, L2-L3, L3-L4 and L5-S1. No lumbar vertebral compression fracture. No more than mild disc space narrowing within the lumbar spine. Facet arthrosis, greatest at L4-L5 and L5-S1. Large stool burden within the colon and rectum. Aortic atherosclerosis. IMPRESSION: No lumbar vertebral compression fracture. Minimal grade 1 retrolisthesis at T12-L1, L1-L2, L2-L3, L3-L4 and L5-S1. Lumbar spondylosis, as described. Large stool burden within the colon and rectum, suggesting constipation. Aortic Atherosclerosis (ICD10-I70.0). Electronically Signed   By: Kellie Simmering D.O.   On: 07/26/2021 17:18   DG HIPS BILAT WITH PELVIS MIN 5 VIEWS  Result Date: 07/26/2021 CLINICAL DATA:  Hip pain EXAM: DG HIP (WITH OR WITHOUT PELVIS) 5+V BILAT COMPARISON:  None Available. FINDINGS: No fracture or malalignment. SI joints are patent. Pubic symphysis is intact. There are mild degenerative changes of both hips. IMPRESSION: Mild degenerative changes.  No acute osseous abnormality Electronically Signed   By: Donavan Foil M.D.   On: 07/26/2021 17:17   CT CHEST  ABDOMEN PELVIS WO CONTRAST  Result Date: 07/23/2021 CLINICAL DATA:  lung mass, weight loss, lumbar/hip pain EXAM: CT CHEST, ABDOMEN AND PELVIS WITHOUT CONTRAST TECHNIQUE: Multidetector CT imaging of the chest, abdomen and pelvis was performed following the standard protocol without IV contrast. RADIATION DOSE REDUCTION: This exam was performed according to the departmental dose-optimization program which includes automated exposure control, adjustment of the mA and/or kV according to patient size and/or use of iterative reconstruction technique. COMPARISON:  None Available. FINDINGS: Evaluation is limited with lack of IV contrast. CT CHEST FINDINGS Cardiovascular: Heart is the upper limits of normal in size. Mild LEFT-sided coronary artery atherosclerotic calcifications. Small pericardial effusion. Aorta is normal in course and caliber. Scattered atherosclerotic calcifications. Mediastinum/Nodes: Visualized thyroid is unremarkable. Mildly prominent LEFT subpectoral axillary lymph node measures 9 mm in the short axis (series 4, image 51). There is mediastinal adenopathy. Representative rounded precarinal lymph node measures 11 mm (series 4, image 74). Evaluation for hilar adenopathy is limited secondary to lack of IV contrast. Ill-defined soft tissue encases the RIGHT mainstem bronchus and extends inferior to the carina and along the course of the trachea. This ill-defined soft tissue is seen within the AP window. Lungs/Pleura: Moderate RIGHT pleural effusion. Trace LEFT pleural effusion. Irregular consolidative opacity throughout the majority of the RIGHT upper lobe with interlobular septal thickening. Masslike consolidation centrally measures approximately 7.5 x 6.4 cm (series 6, image 61). It demonstrates mass effect on the RIGHT upper lobe bronchi and occludes them superiorly. Irregular soft tissue extends into the RIGHT middle and lower lobe with ill-defined soft tissue seen coursing along the RIGHT mainstem  bronchus. Patchy LEFT basilar irregular centrilobular nodular opacities, nonspecific. Musculoskeletal: Infiltrative osseous metastatic disease is favored to involve every thoracic vertebral body and many of the visualized cervical vertebral bodies. Pathologic compression fracture deformity of T5. Lesions involve multiple bilateral ribs with scattered the pathologic fractures noted including  the LEFT-sided fifth rib and RIGHT-sided eleventh rib. There are 13 thoracic type vertebral bodies. CT ABDOMEN PELVIS FINDINGS Hepatobiliary: There are multiple incompletely characterized hypoechoic masses with representative incompletely characterized hypoechoic mass of the RIGHT liver (series 3, image 75). These are incompletely characterized given lack of IV contrast. Gallbladder is unremarkable. Pancreas: No peripancreatic fat stranding. Spleen: Unremarkable. Adrenals/Urinary Tract: Fullness of the LEFT adrenal gland. No hydronephrosis. No obstructing nephrolithiasis. Bladder is unremarkable. Stomach/Bowel: No evidence of bowel obstruction. Moderate colonic stool burden predominately within the RIGHT hemicolon. Appendix is normal. Vascular/Lymphatic: Atherosclerotic calcifications of the nonaneurysmal abdominal aorta. No definitive intra-abdominal adenopathy is identified in the limitations of this noncontrast exam. Reproductive: Prostate is present Other: No free air.  No free fluid. Musculoskeletal: Soft tissue mass involving the LEFT femoral neck and eroding the cortex anteriorly (series 4, image 310). It places patient at the risk for pathologic fracture. Infiltrative metastatic disease involving the LEFT acetabulum, pelvis, sacrum and all of the lumbar vertebral bodies. IMPRESSION: 1. Mass involving the majority of the RIGHT upper lobe with soft tissue extending along the RIGHT upper lobe and RIGHT mainstem bronchus, along the carina and into the AP window. This is concerning for primary lung malignancy. 2. Widespread  osseous metastatic disease. Of note there is an osseous lesion involving the LEFT femoral head and neck cortex which places the patient at risk for impending pathologic fracture. Recommend consultation with Orthopedic surgery. 3. Moderate RIGHT pleural effusion, possibly malignant. 4. Multiple hypoechoic masses throughout the liver, incompletely assessed given lack of IV contrast. These are concerning for hepatic metastatic disease. 5. Fullness of the LEFT adrenal gland, likely adrenal metastatic disease. 6. There may be a postobstructive infectious or atelectatic component within the RIGHT upper lobe. Lymphangitic spread remains a differential consideration. 7. Scattered LEFT basilar centrilobular nodular opacities are nonspecific and could be infectious or inflammatory in etiology versus reflect additional metastatic disease. 8. Pathologic compression fracture of T5. These results were called by telephone at the time of interpretation on 07/23/2021 at 2:26 pm to provider MELANIE BELFI , who verbally acknowledged these results. Electronically Signed   By: Valentino Saxon M.D.   On: 07/08/2021 14:39    ROS:  as above.  10 system review o/w negative PE:  Blood pressure (!) 165/67, pulse 66, temperature 98.5 F (36.9 C), temperature source Oral, resp. rate 17, height 6' (1.829 m), weight 68 kg, SpO2 96 %. 40 wd male in nad.  A and O x 4.  Normal mood and affect.  EOMI.  Resp unlabored.  L LE with palpable pulses.  Pain with log roll.  5/5 strength in PF and DF of the ankle and toes.  Intact sens to LT at the forefoot dorsally and plantarly.  No lympadenopathy at the knee.  Assessment/Plan: Impending pathologic fracture of the left femoral neck - I explained the nature of the left hip pain, left femoral neck lesion and impending pathologic fracture to the patient and his wife in detail.  He will need prophylactic nailing of the left femur with a cepholemedullary device in the near future.  Keep NPO and hold  blood thinners for now.  We will work on scheduling surgery as quickly as possible.  In the meantime, NWB on the L LE.  Wylene Simmer 07/28/2021, 11:25 PM

## 2021-07-27 NOTE — Assessment & Plan Note (Signed)
Laboratory studies were ordered.  Patient's labs returned early this morning.  He has severe hypercalcemia.  This is an emergency.  I have spoken with his primary care physician.  He is going to the hospital for further management and work-up. Additionally he has other laboratory abnormalities which are very concerning.  Elevated alkaline phosphatase, leukocytosis, low albumin.  Patient also has an acute kidney injury.

## 2021-07-27 NOTE — ED Notes (Signed)
Report given to Katrina RN in [place]

## 2021-07-28 ENCOUNTER — Inpatient Hospital Stay (HOSPITAL_COMMUNITY): Payer: 59

## 2021-07-28 DIAGNOSIS — E875 Hyperkalemia: Secondary | ICD-10-CM | POA: Diagnosis present

## 2021-07-28 DIAGNOSIS — N179 Acute kidney failure, unspecified: Secondary | ICD-10-CM | POA: Diagnosis not present

## 2021-07-28 DIAGNOSIS — E871 Hypo-osmolality and hyponatremia: Secondary | ICD-10-CM | POA: Diagnosis present

## 2021-07-28 HISTORY — PX: IR THORACENTESIS ASP PLEURAL SPACE W/IMG GUIDE: IMG5380

## 2021-07-28 LAB — BASIC METABOLIC PANEL WITH GFR
Anion gap: 7 (ref 5–15)
BUN: 44 mg/dL — ABNORMAL HIGH (ref 6–20)
CO2: 23 mmol/L (ref 22–32)
Calcium: 13.6 mg/dL (ref 8.9–10.3)
Chloride: 103 mmol/L (ref 98–111)
Creatinine, Ser: 2.35 mg/dL — ABNORMAL HIGH (ref 0.61–1.24)
GFR, Estimated: 31 mL/min — ABNORMAL LOW
Glucose, Bld: 282 mg/dL — ABNORMAL HIGH (ref 70–99)
Potassium: 5.3 mmol/L — ABNORMAL HIGH (ref 3.5–5.1)
Sodium: 133 mmol/L — ABNORMAL LOW (ref 135–145)

## 2021-07-28 LAB — BODY FLUID CELL COUNT WITH DIFFERENTIAL
Eos, Fluid: 0 %
Lymphs, Fluid: 37 %
Monocyte-Macrophage-Serous Fluid: 38 % — ABNORMAL LOW (ref 50–90)
Neutrophil Count, Fluid: 25 % (ref 0–25)
Total Nucleated Cell Count, Fluid: 678 cu mm (ref 0–1000)

## 2021-07-28 LAB — CBC
HCT: 33.3 % — ABNORMAL LOW (ref 39.0–52.0)
Hemoglobin: 10.7 g/dL — ABNORMAL LOW (ref 13.0–17.0)
MCH: 28.6 pg (ref 26.0–34.0)
MCHC: 32.1 g/dL (ref 30.0–36.0)
MCV: 89 fL (ref 80.0–100.0)
Platelets: 279 10*3/uL (ref 150–400)
RBC: 3.74 MIL/uL — ABNORMAL LOW (ref 4.22–5.81)
RDW: 13.1 % (ref 11.5–15.5)
WBC: 20.2 10*3/uL — ABNORMAL HIGH (ref 4.0–10.5)
nRBC: 0 % (ref 0.0–0.2)

## 2021-07-28 LAB — GLUCOSE, CAPILLARY
Glucose-Capillary: 348 mg/dL — ABNORMAL HIGH (ref 70–99)
Glucose-Capillary: 312 mg/dL — ABNORMAL HIGH (ref 70–99)
Glucose-Capillary: 199 mg/dL — ABNORMAL HIGH (ref 70–99)
Glucose-Capillary: 132 mg/dL — ABNORMAL HIGH (ref 70–99)

## 2021-07-28 IMAGING — MR MR FEMUR*L* W/O CM
3 of 11 series · 9 of 40 positions shown · non-contrast
Comparison: Pelvis CT [DATE].

CLINICAL DATA: Metastatic disease evaluation

EXAM:
MR OF THE LEFT FEMUR WITHOUT CONTRAST
TECHNIQUE: Multiplanar, multisequence MR imaging of the left femur was
performed. No intravenous contrast was administered.

[Series 5: T1 · coronal · 4.0mm · 0.49mm/px · 3 of 34 slices shown (1 of 2)]
[im 1/34]
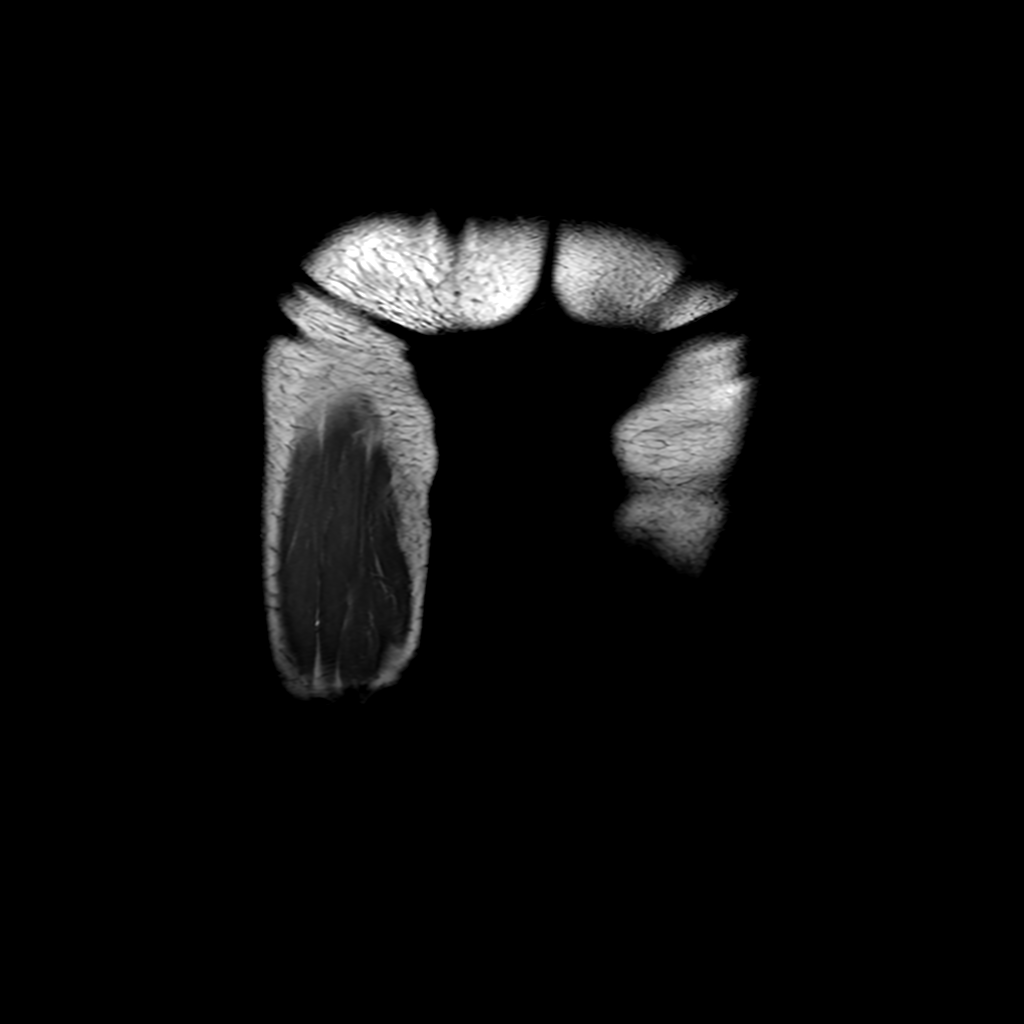
[im 17/34]
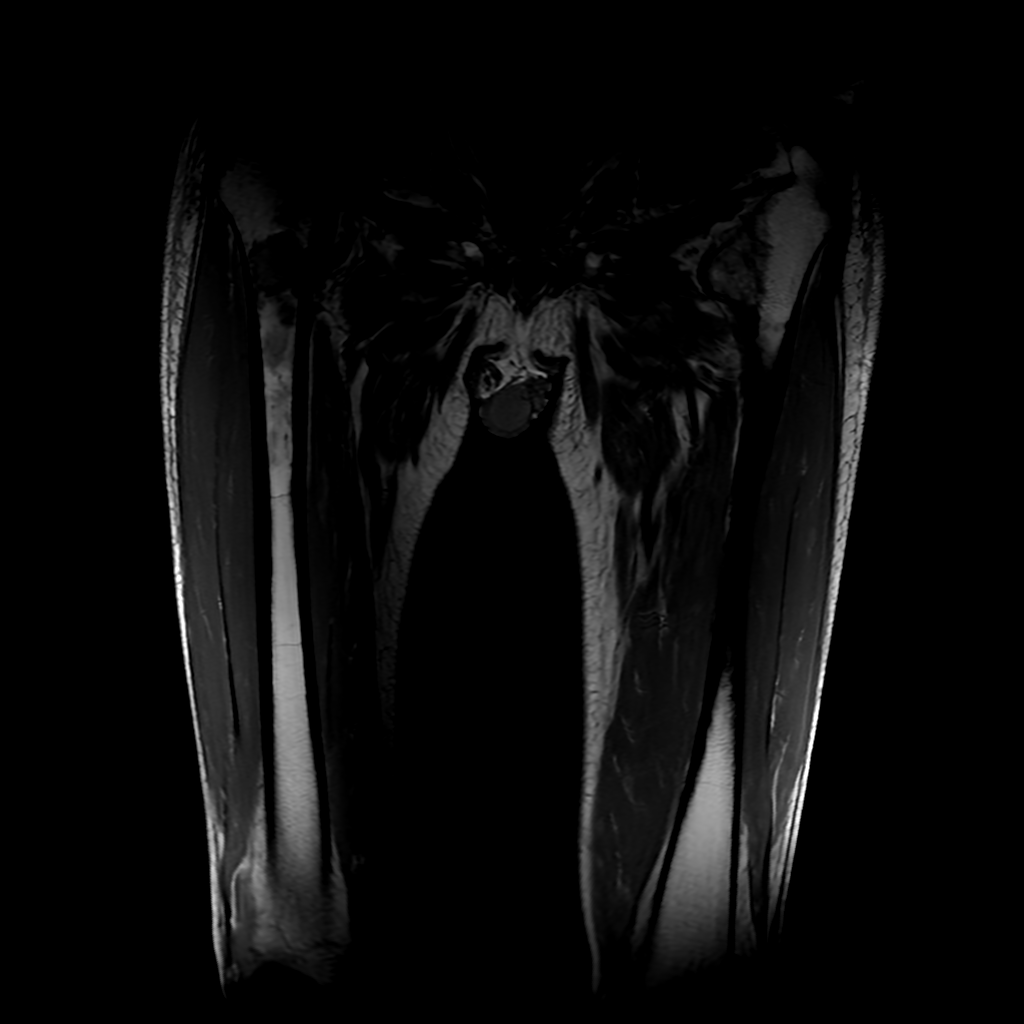
[im 34/34]
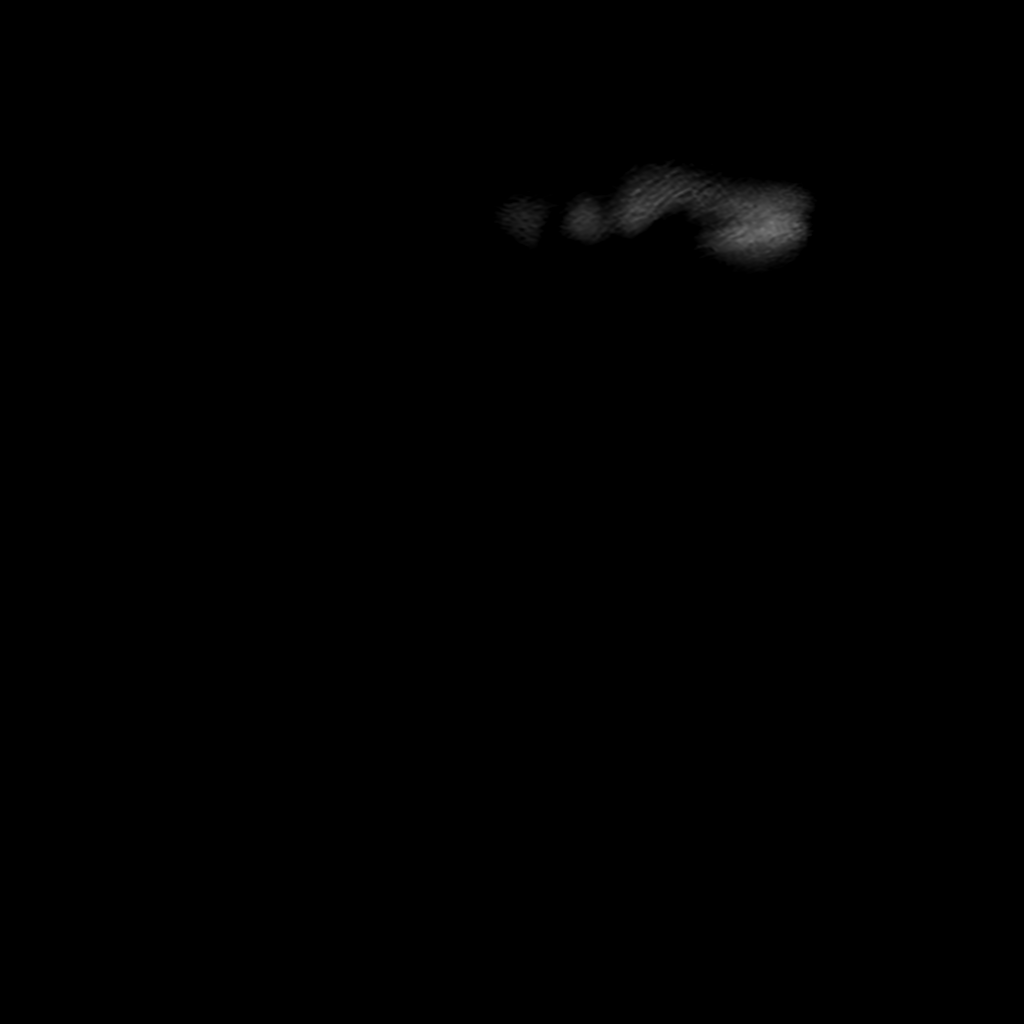

[Series 6: T1 · axial · 4.0mm · 0.51mm/px · z∈[-155,+154]mm · 3 of 94 slices shown (2 of 2)]
[im 16/94]
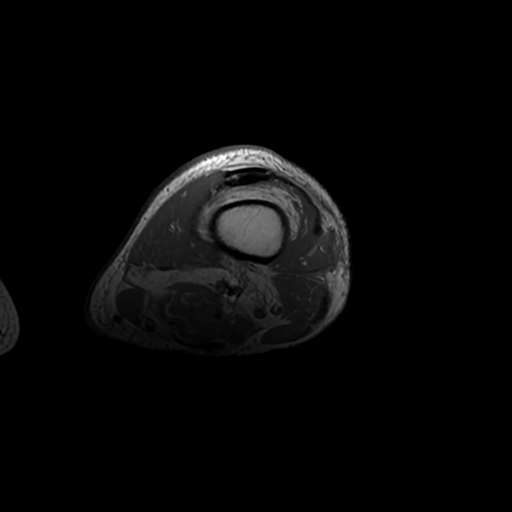
[im 47/94]
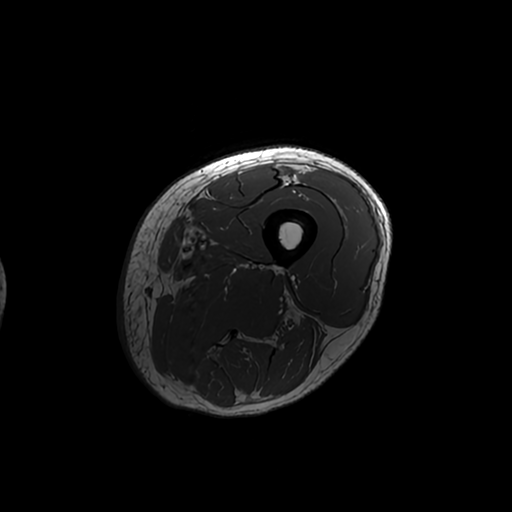
[im 78/94]
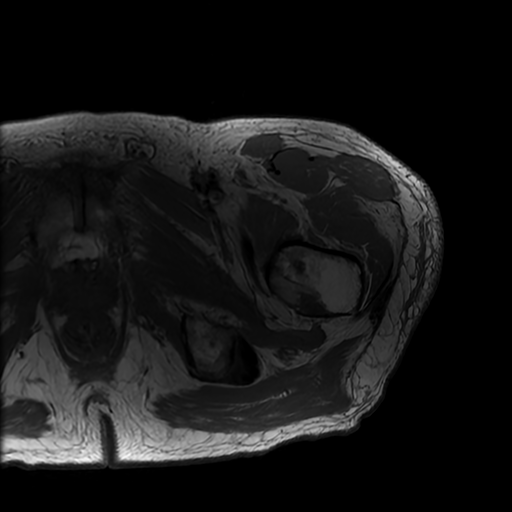

[Series 7: T2 fat-sat · axial · 4.0mm · 0.51mm/px · z∈[-163,+161]mm · 3 of 99 slices shown]
[im 17/99]
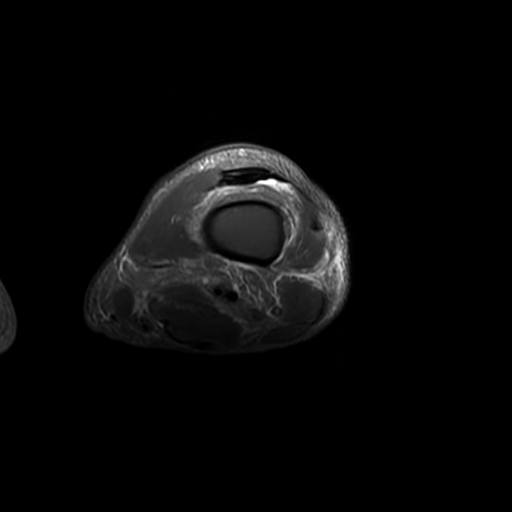
[im 50/99]
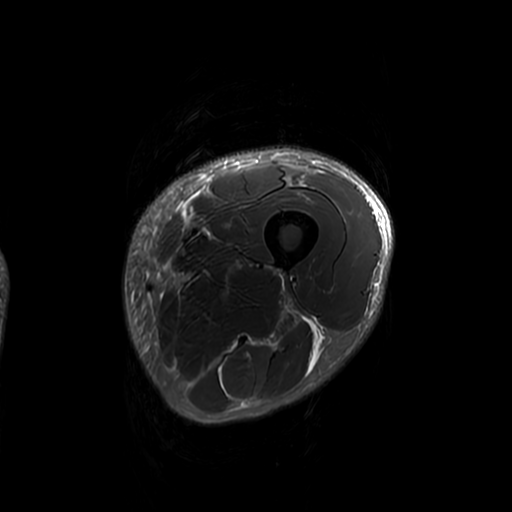
[im 82/99]
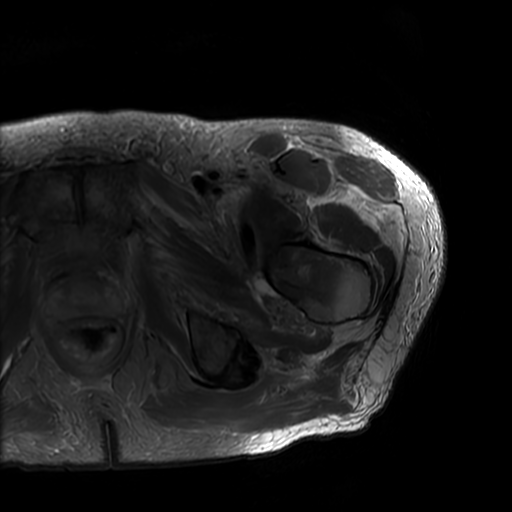

[9 of 40 positions shown; findings below may reference images not displayed]

FINDINGS: Bones/Joint/Cartilage

There are T2 hyperintense/T1 hypointense metastases within the
proximal femurs bilaterally, including in the femoral heads, necks,
intertrochanteric, and subtrochanteric regions.

There is a lytic destructive lesion of the left femoral neck which
involves up to 40% of the femoral neck width, at high risk for
pathologic fracture.

There is a large metastasis in the proximal right subtrochanteric
femur which is also likely high risk for pathologic fracture,
partially imaged only in the coronal plane but involving a
significant portion of the subtrochanteric femur width.

Partially visualized osseous metastatic lesions throughout the
pelvis, most confluent in the left posterior acetabulum and ischial
tuberosity.

Muscles and Tendons

There is feathery intramuscular edema within the hip adductors
bilaterally, likely reactive. There is inter fascial edema
throughout the left thigh without focal collection.

Soft tissues

Subcutaneous edema of the left thigh. There is trace nonspecific
free fluid in the pelvis, partially visualized.
IMPRESSION: Multiple osseous metastases within the proximal femurs bilaterally,
most concerning in the left femoral neck and right subtrochanteric
femur, both high risk for pathologic fracture.

Partially visualized osseous metastatic disease throughout the
pelvis, most confluent in the left posterior acetabulum.

## 2021-07-28 IMAGING — US IR THORACENTESIS ASP PLEURAL SPACE W/IMG GUIDE
1 series · 1 of 1 positions shown · non-contrast
Comparison: none

INDICATION: Patient was recently diagnosed right upper lobe lung mass, right
pleural effusion. Request IR for diagnostic and therapeutic right
thoracentesis

[Series 1: ir (person_name)/(person_name) · 1 of 1 slices shown]
[im 1/1]
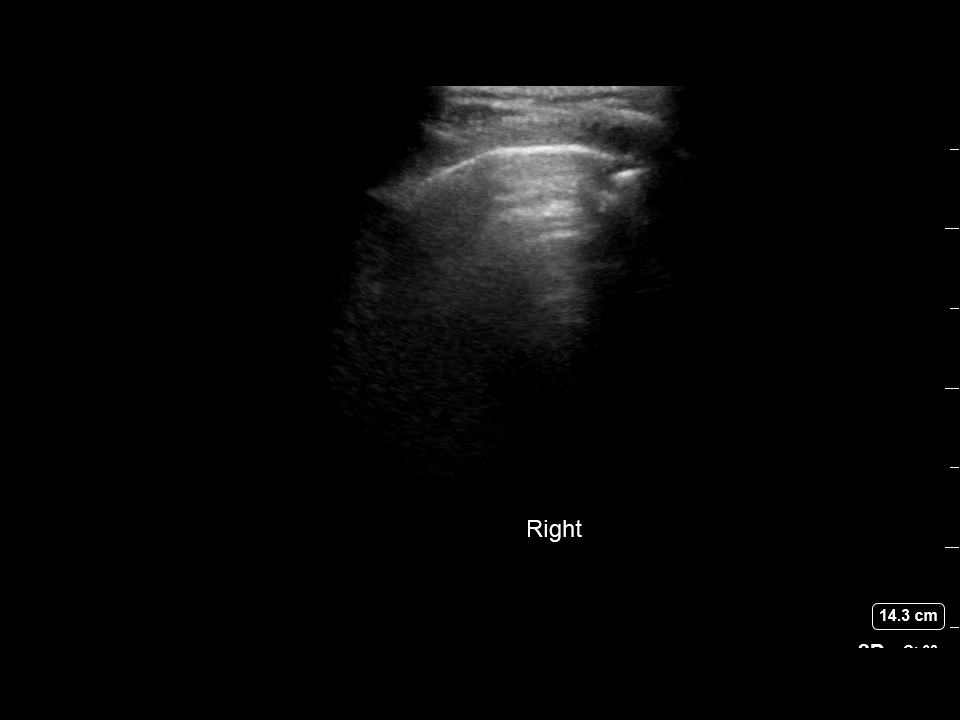

[1 of 1 positions shown; findings below may reference images not displayed]

EXAM:
ULTRASOUND GUIDED RIGHT THORACENTESIS

MEDICATIONS:
6 mL 1% lidocaine

COMPLICATIONS:
None immediate.

PROCEDURE:
An ultrasound guided thoracentesis was thoroughly discussed with the
patient and questions answered. The benefits, risks, alternatives
and complications were also discussed. The patient understands and
wishes to proceed with the procedure. Written consent was obtained.

Ultrasound was performed to localize and mark an adequate pocket of
fluid in the right chest. The area was then prepped and draped in
the normal sterile fashion. 1% Lidocaine was used for local
anesthesia. Under ultrasound guidance a 6 Fr Safe-T-Centesis
catheter was introduced. Thoracentesis was performed. The catheter
was removed and a dressing applied.
FINDINGS: A total of approximately 750 mL of hazy yellow fluid was removed.
Samples were sent to the laboratory as requested by the clinical
team.
IMPRESSION: Successful ultrasound guided right thoracentesis yielding 750 mL of
pleural fluid.

Read by GARGI

## 2021-07-28 IMAGING — CT CT PELVIS W/O CM
2 of 5 series · 15 of 46 positions shown, 18 images · non-contrast
Comparison: [DATE].

CLINICAL DATA: Pelvic fracture.

EXAM:
CT PELVIS WITHOUT CONTRAST
TECHNIQUE: Multidetector CT imaging of the pelvis was performed following the
standard protocol without intravenous contrast.
RADIATION DOSE REDUCTION: This exam was performed according to the
departmental dose-optimization program which includes automated
exposure control, adjustment of the mA and/or kV according to
patient size and/or use of iterative reconstruction technique.

[Series 3: soft tissue · axial · 0.78mm/px · z∈[+692,+982]mm · 12 of 66 slices shown, 15 images]
[im 5/66  soft-tissue]
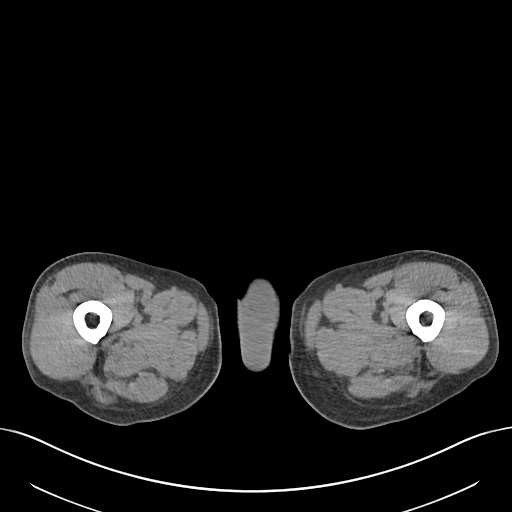
[im 5/66  bone]
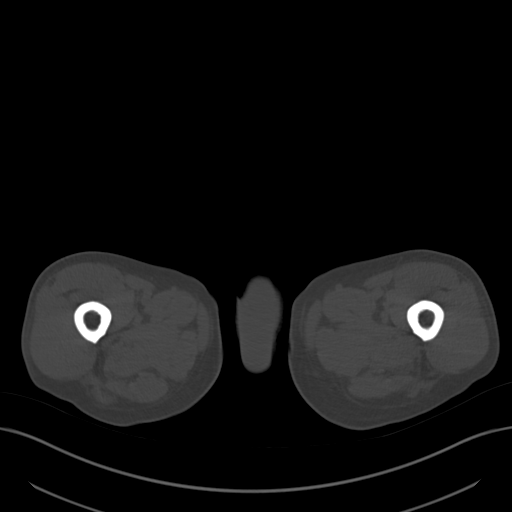
[im 13/66  soft-tissue]
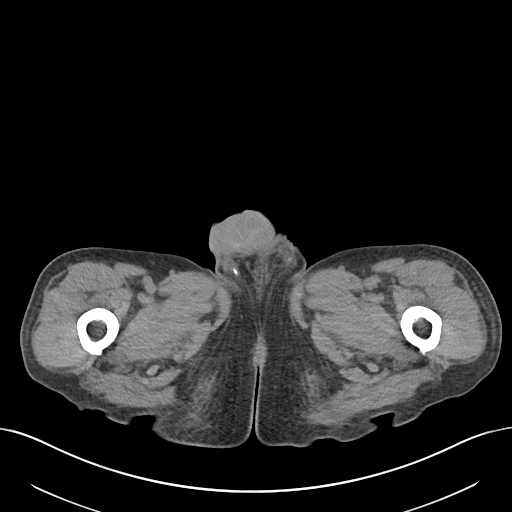
[im 19/66  soft-tissue]
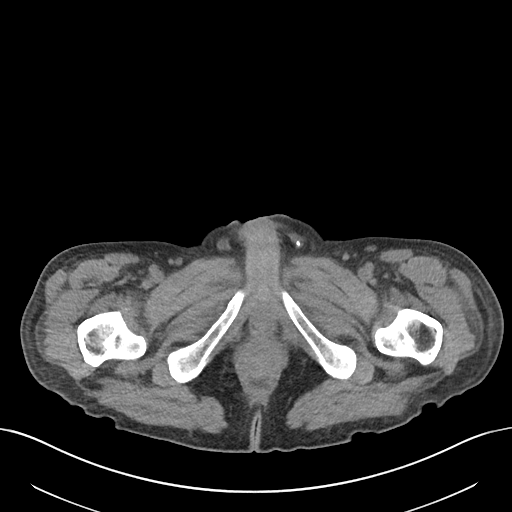
[im 26/66  soft-tissue]
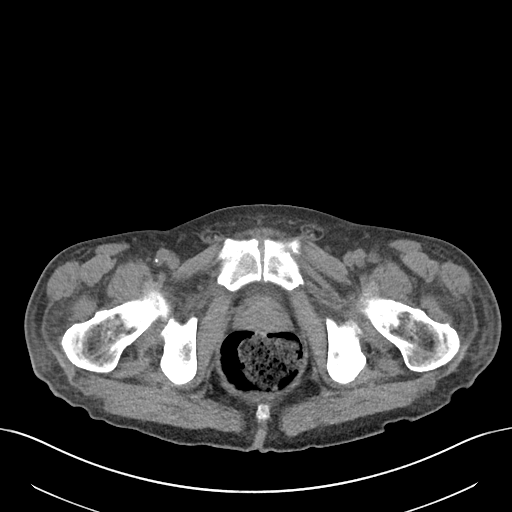
[im 34/66  soft-tissue]
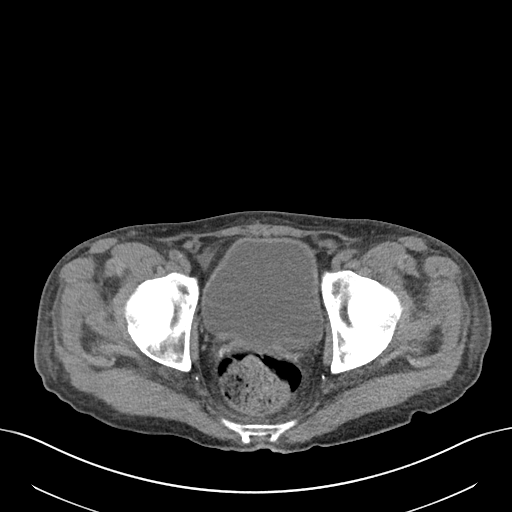
[im 40/66  soft-tissue]
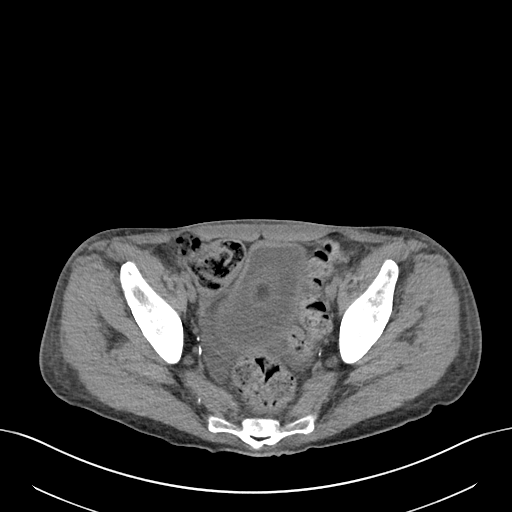
[im 47/66  soft-tissue]
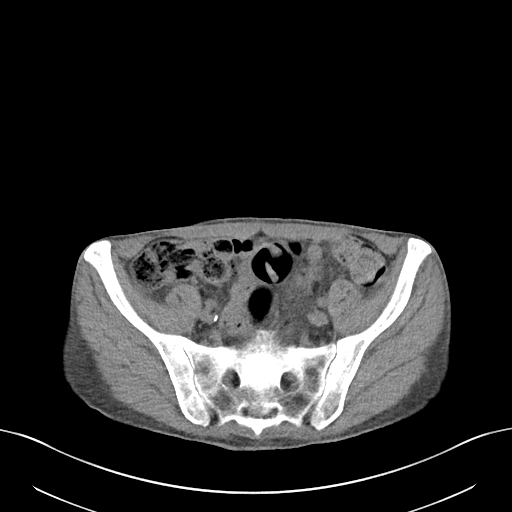
[im 55/66  soft-tissue]
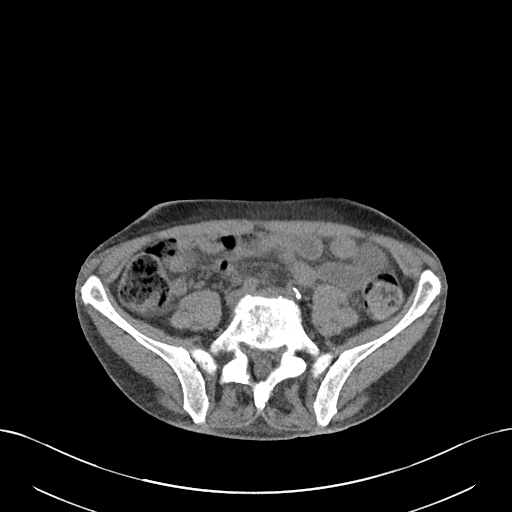
[im 57/66  lung]
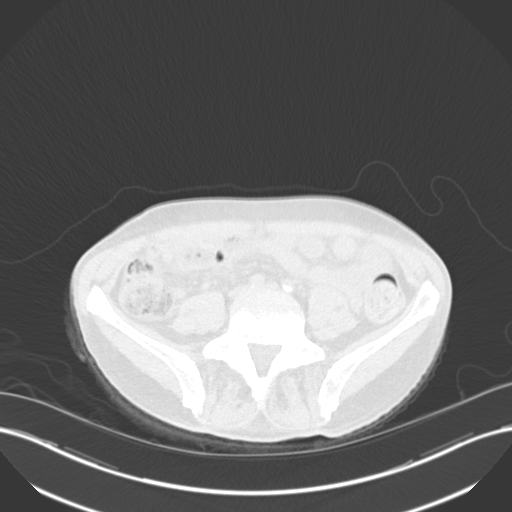
[im 59/66  lung]
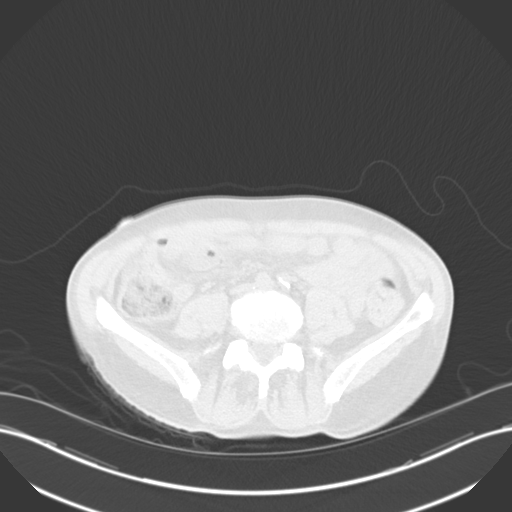
[im 61/66  soft-tissue]
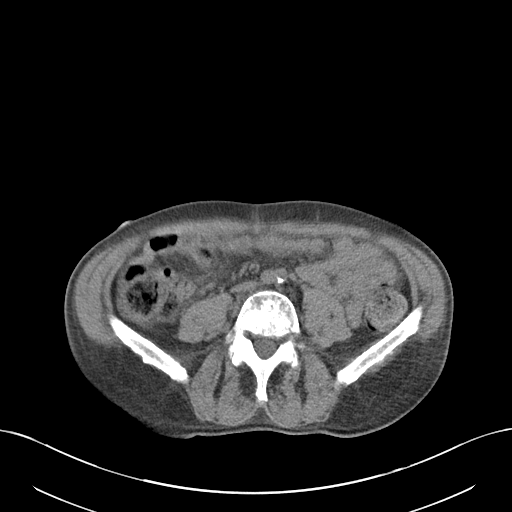
[im 61/66  lung]
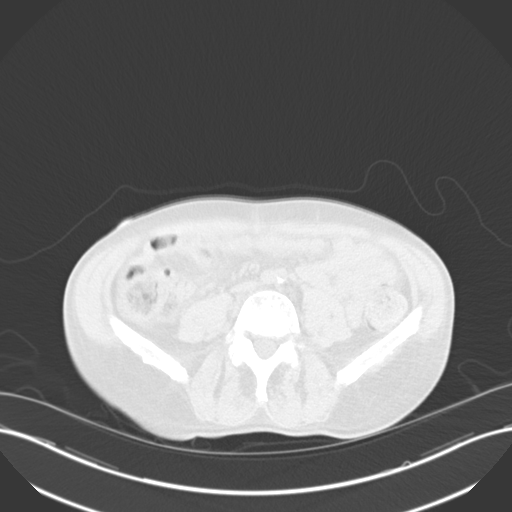
[im 61/66  bone]
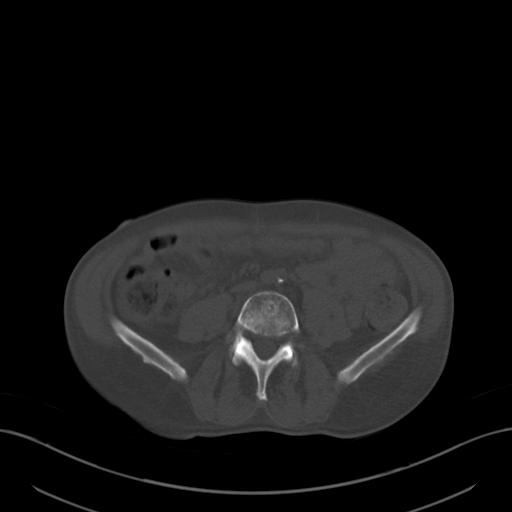
[im 63/66  lung]
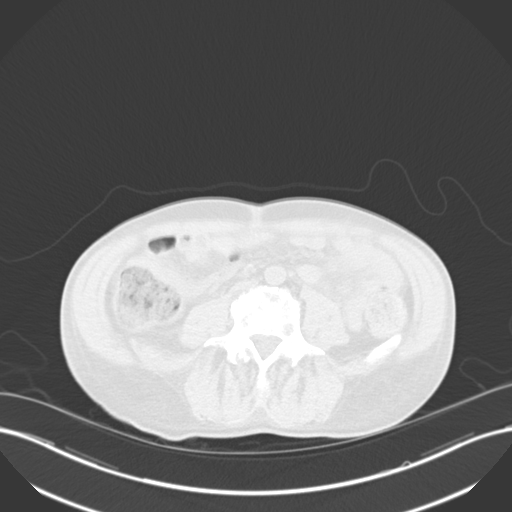

[Series 7: cor st · coronal · 0.65mm/px · 3 of 103 slices shown]
[im 26/103  soft-tissue]
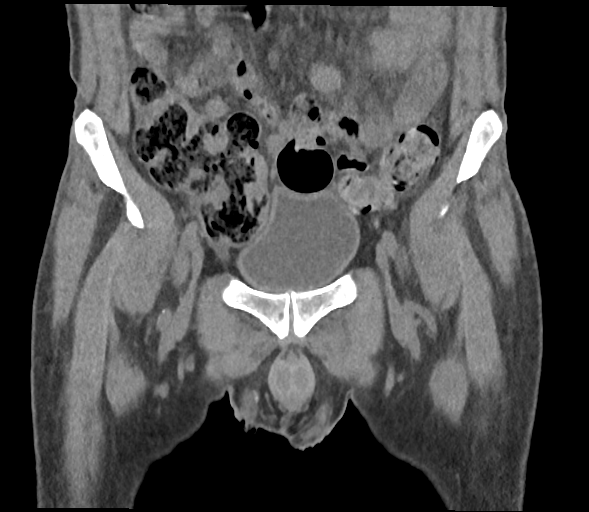
[im 52/103  soft-tissue]
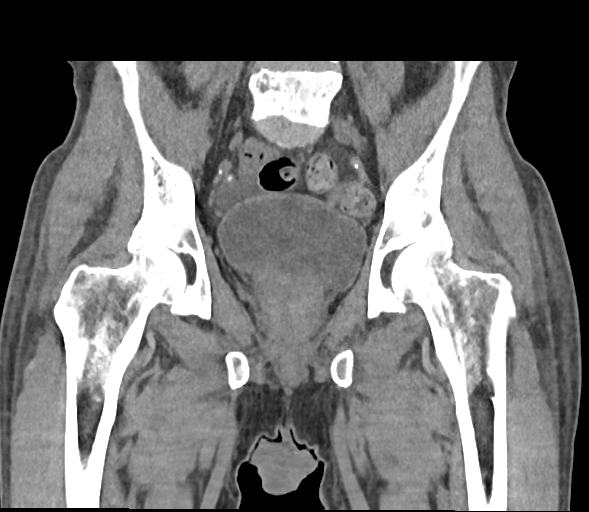
[im 77/103  soft-tissue]
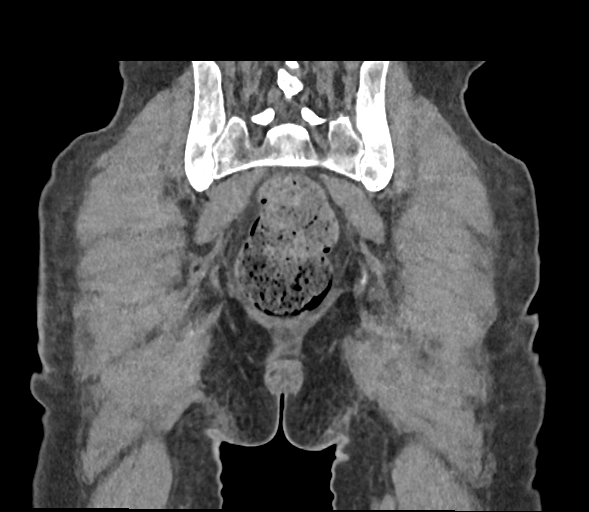

[15 of 46 positions shown; findings below may reference images not displayed]

FINDINGS: Urinary Tract: No definite abnormality seen involving the urinary
bladder.

Bowel:  Unremarkable visualized pelvic bowel loops.

Vascular/Lymphatic: No pathologically enlarged lymph nodes. No
significant vascular abnormality seen.

Reproductive:  Prostate gland is unremarkable.

Other:  No definite evidence of hernia or ascites is noted.

Musculoskeletal: There is again noted lytic destruction involving
the left femoral head and neck anteriorly consistent with metastatic
disease. Sclerotic density is seen involving the posterior portion
of the left acetabulum as well as iliac bone adjacent to the left
sacroiliac joint consistent with metastatic disease. Sclerotic
densities are also noted in the visualized lumbar vertebral bodies
and sacrum concerning for metastatic disease.
IMPRESSION: Focal lytic destruction involving the left femoral neck and head is
again noted consistent with metastatic disease. Sclerotic densities
are again noted involving the left acetabulum and iliac bone, as
well as visualized lumbar vertebral bodies and sacrum concerning for
metastatic disease.

## 2021-07-28 IMAGING — MR MR THORACIC SPINE W/O CM
4 of 8 series · 18 of 48 positions shown · non-contrast
Comparison: No prior MRI, correlation is made with CT chest abdomen
pelvis [DATE]

CLINICAL DATA: Compression fracture

EXAM:
MRI THORACIC SPINE WITHOUT CONTRAST
TECHNIQUE: Multiplanar, multisequence MR imaging of the thoracic spine was
performed. No intravenous contrast was administered.

[Series 3: T2 · sagittal · 3.0mm · 0.66mm/px · 3 of 15 slices shown (1 of 4)]
[im 1/15]
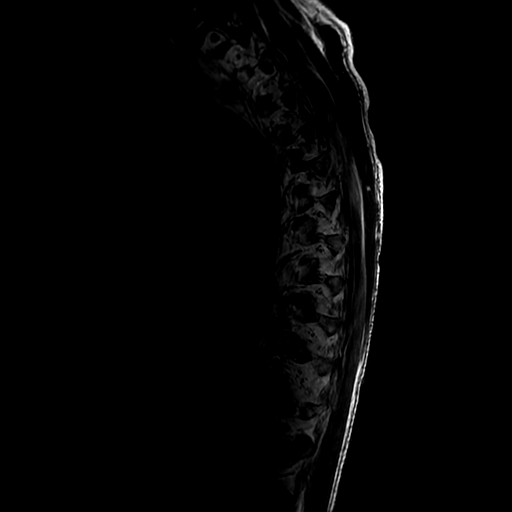
[im 8/15]
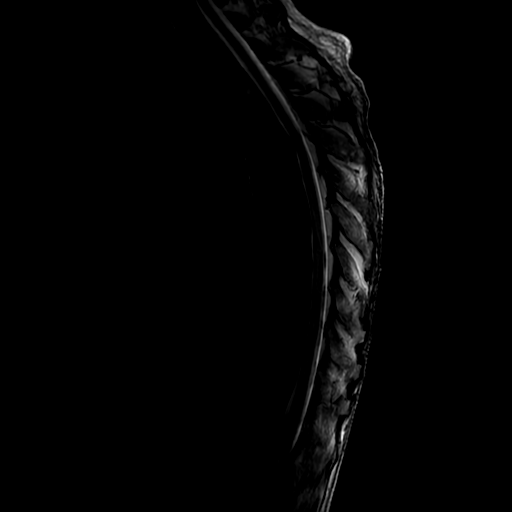
[im 15/15]
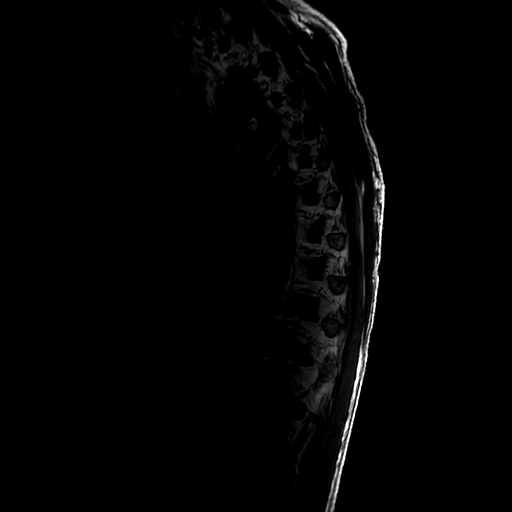

[Series 7: T2 · axial · 4.0mm · 0.39mm/px · z∈[-265,-55]mm · 8 of 43 slices shown (2 of 4)]
[im 1/43]
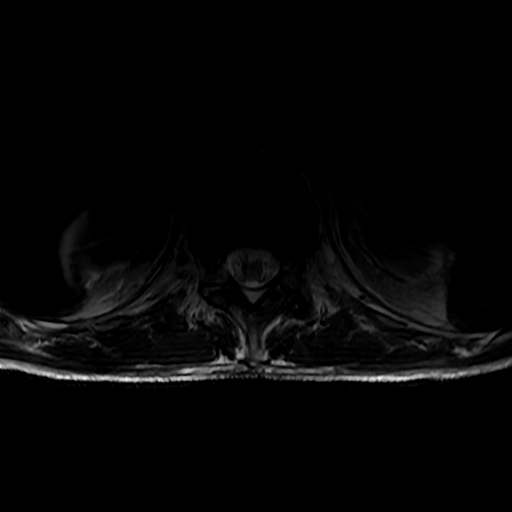
[im 5/43]
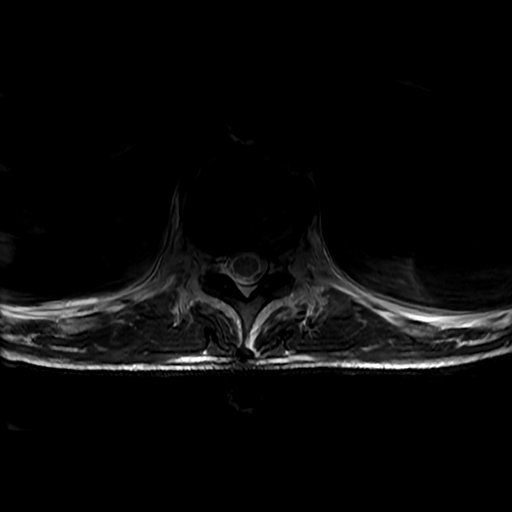
[im 15/43]
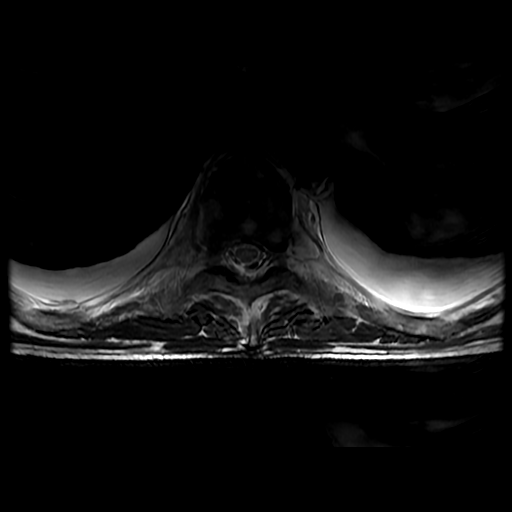
[im 19/43]
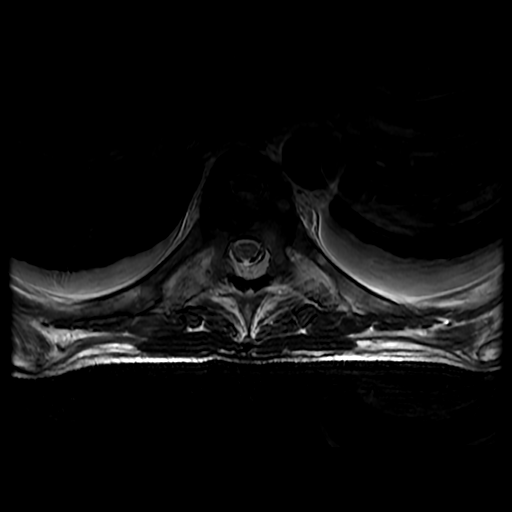
[im 24/43]
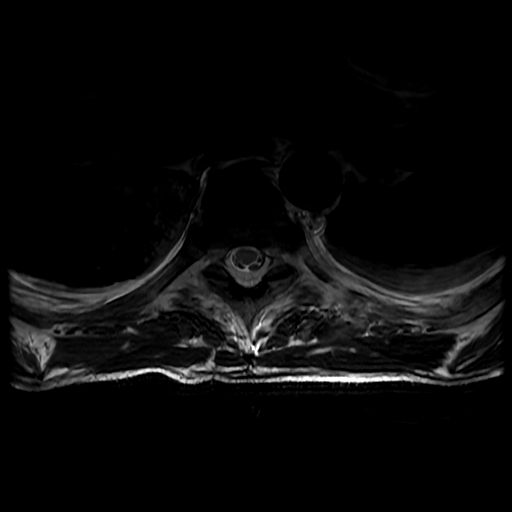
[im 29/43]
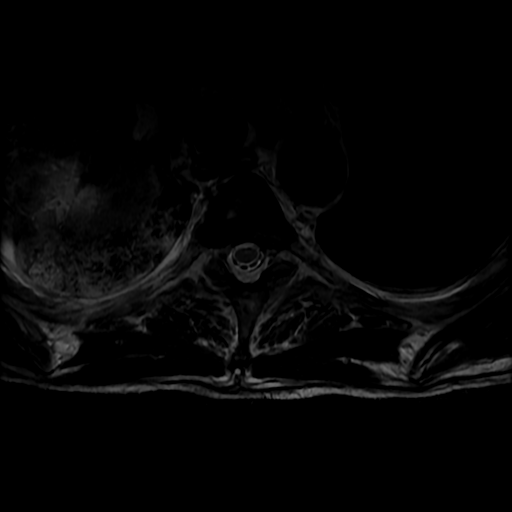
[im 38/43]
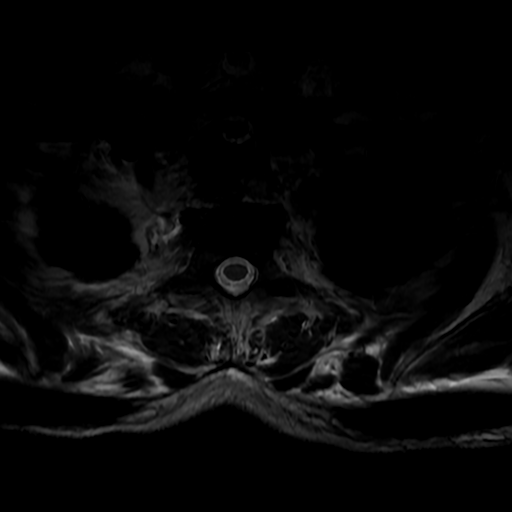
[im 43/43]
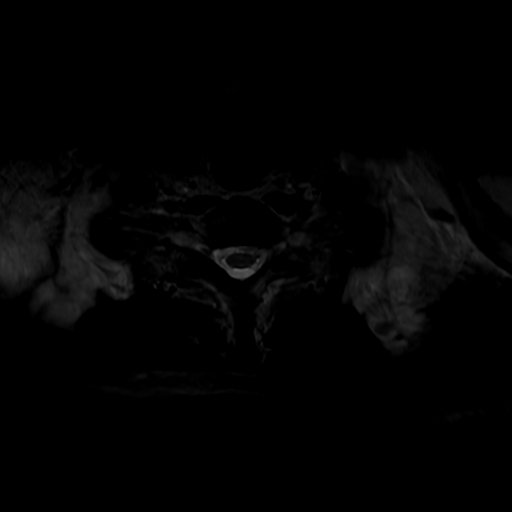

[Series 9: T2 · axial · 4.0mm · 0.39mm/px · z∈[-183,-65]mm · 4 of 32 slices shown (3 of 4)]
[im 1/32]
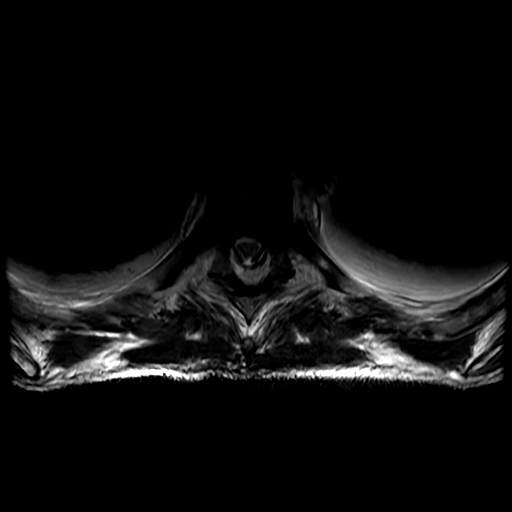
[im 6/32]
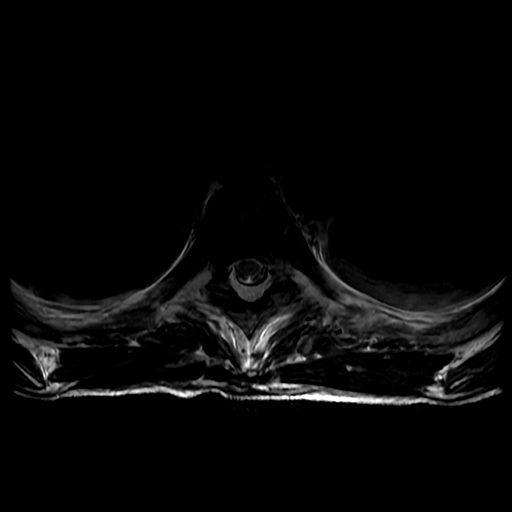
[im 16/32]
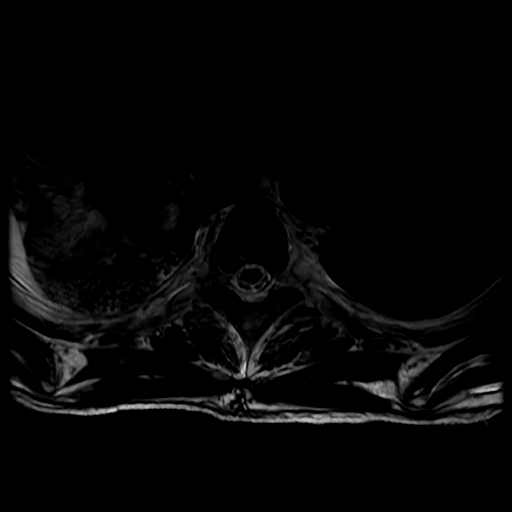
[im 26/32]
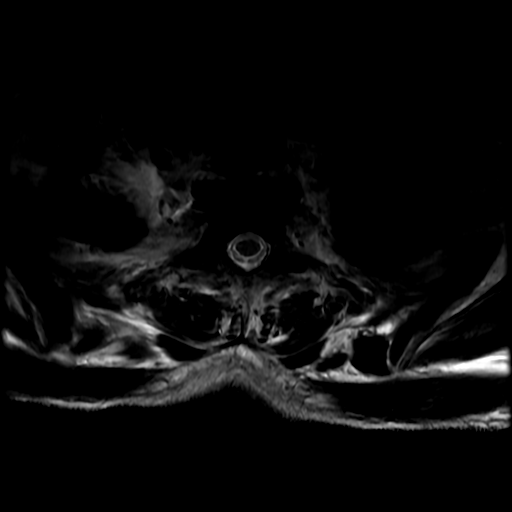

[Series 10: T2 · axial · 4.0mm · 0.39mm/px · z∈[-286,-148]mm · 3 of 36 slices shown (4 of 4)]
[im 6/36]
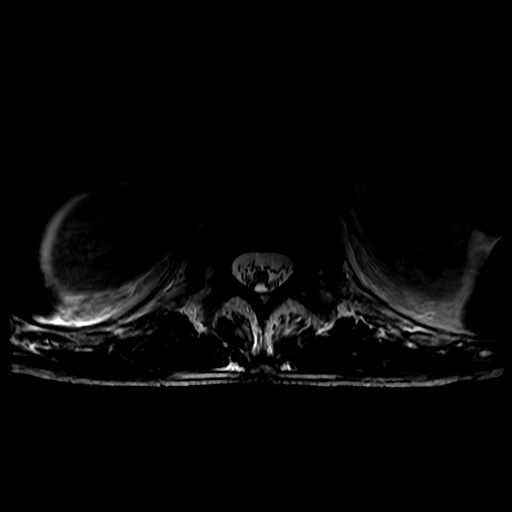
[im 21/36]
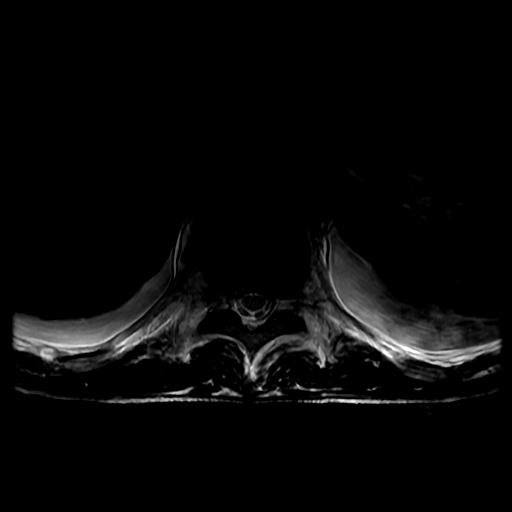
[im 31/36]
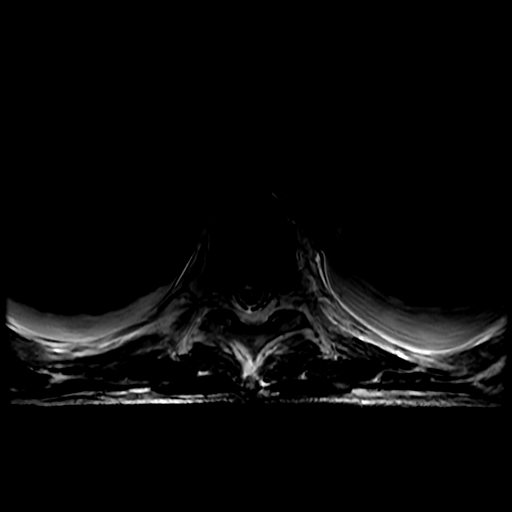

[18 of 48 positions shown; findings below may reference images not displayed]

FINDINGS: Alignment:  Physiologic.

Vertebrae: Abnormal marrow signal is seen at every level, with
decreased T1 and increased T2 signal throughout the T1, T3, T7, T10,
T12, and L1 vertebral bodies, with the remaining thoracic spine
demonstrating more focal lesions than diffuse involvement.

50% vertebral body height loss in T5, with bowing of the posterior
cortex, most likely an acute to subacute pathologic fracture. 15%
vertebral body height loss in T7, without definite bowing of the
posterior cortex, which may also represent a pathologic fracture of
indeterminate acuity. Lesser vertebral body height loss at T8, T9,
and T10 does not appear acute.

Multifocal involvement of the posterior elements. Abnormal signal is
also noted in the imaged portions of the proximal left second sixth,
eighth, tenth, and twelfth ribs and proximal right seventh, eighth
and tenth ribs.

Cord:  Normal signal and morphology.

Paraspinal and other soft tissues: Mass involving the majority of
the right upper lobe is better evaluated on the [DATE] CT chest
abdomen pelvis. Small pleural effusions, decreased on the right and
stable to slightly increased on the left. T2 hyperintense lesions in
the liver, which could represent metastatic disease but could also
be cystic.

Disc levels:

No spinal canal stenosis. The bowing of the posterior cortex of T5
narrows the spinal canal somewhat but does not cause significant
stenosis. No significant neural foraminal narrowing.
IMPRESSION: 1. 50% vertebral body height loss at T5, with bowing of the
posterior cortex, most likely an acute to subacute pathologic
fracture.
2. 15% vertebral body height loss at T7, which may represent a
pathologic fracture of indeterminate acuity.
3. Diffusely abnormal marrow signal, concerning for metastatic
disease.
4. Right lung mass and hepatic lesions are better evaluated on the
prior CT.
5. Interval decrease in the size of the previously noted right
pleural effusion, with stable or slightly increased in size left
pleural effusion.

## 2021-07-28 IMAGING — CR DG CHEST 1V
2 series · 2 of 2 positions shown · non-contrast
Comparison: CT [DATE]

CLINICAL DATA: Post thoracentesis right side

EXAM:
CHEST  1 VIEW

[chest ap (1 of 2)]
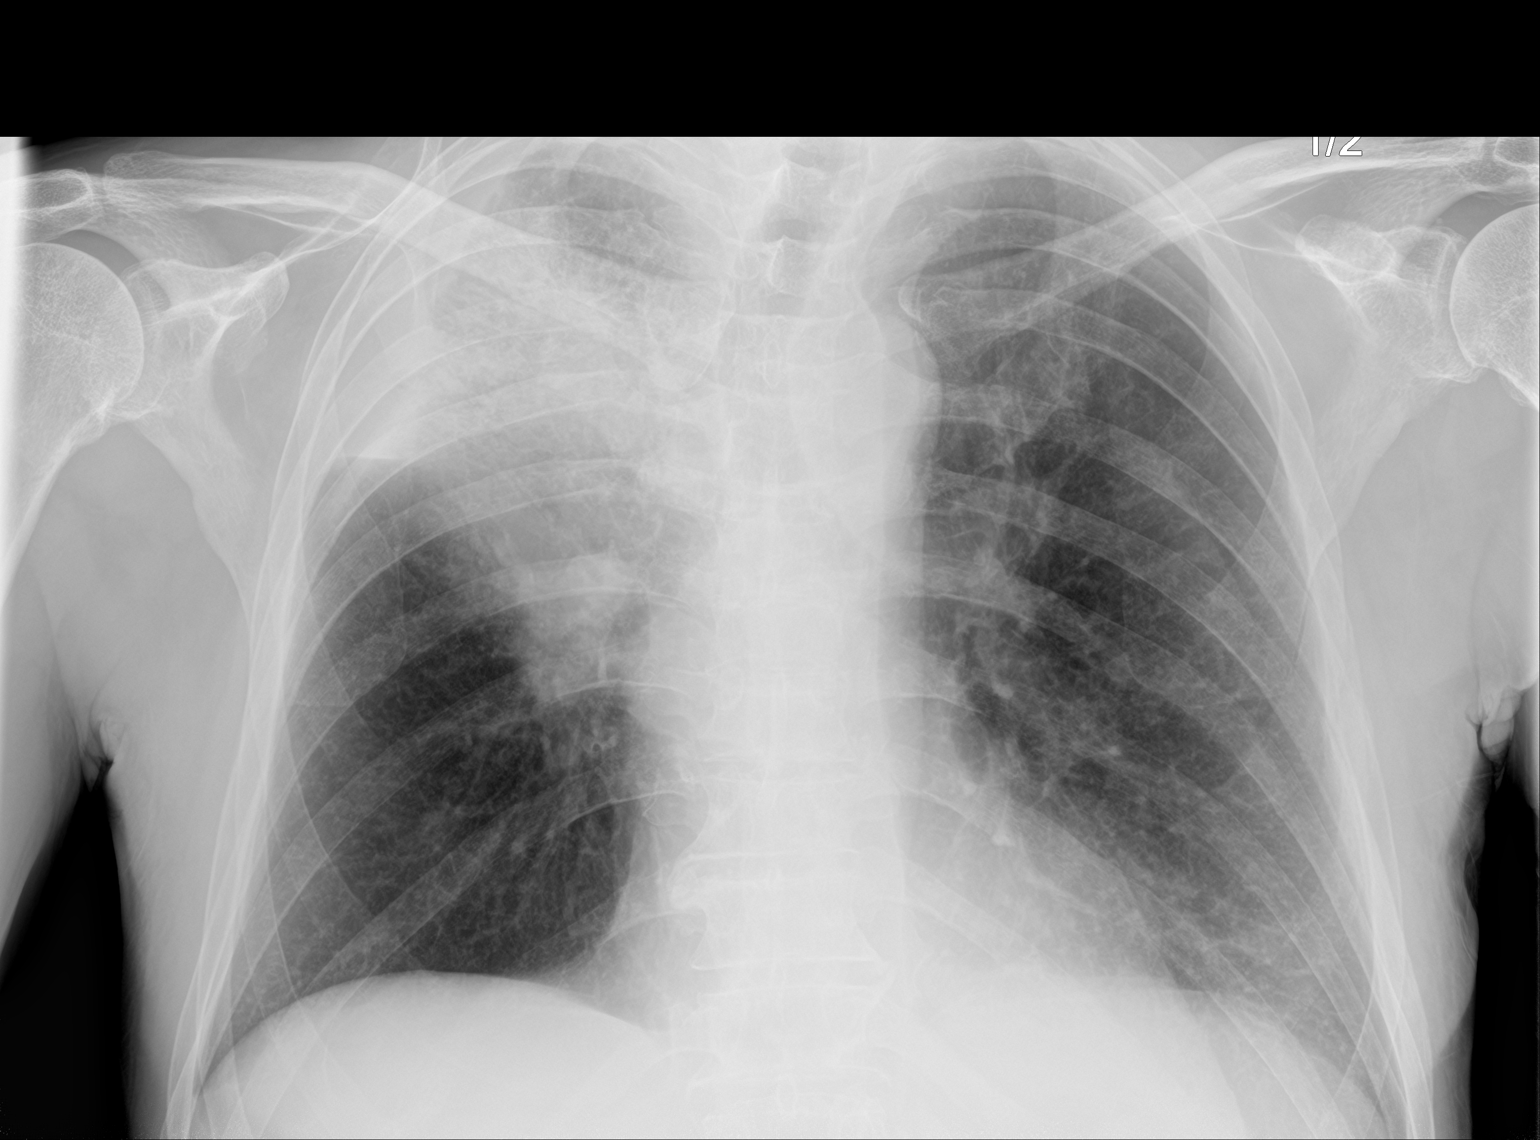

[chest ap (2 of 2)]
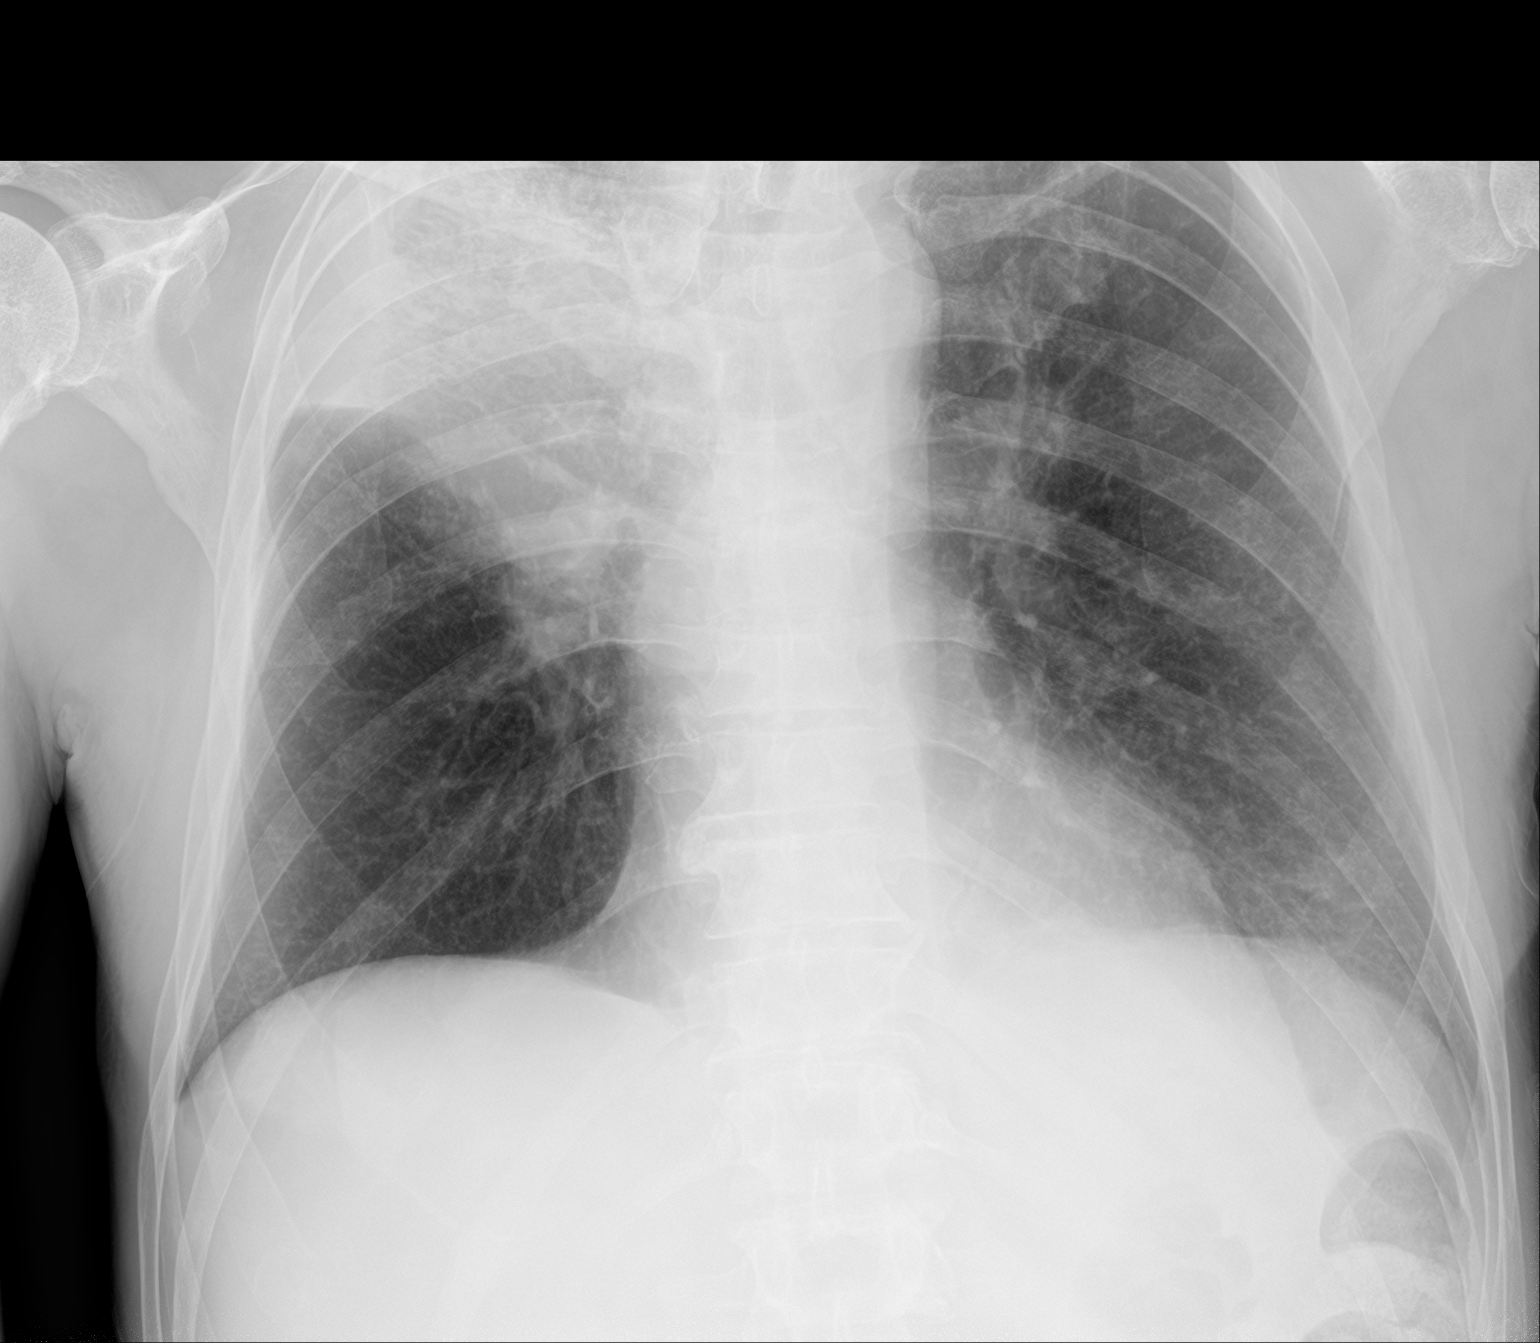

[2 of 2 positions shown; findings below may reference images not displayed]

FINDINGS: Decreased right pleural effusion after thoracentesis. Unchanged
right upper lung mass and consolidation. Left lung is clear. No
pneumothorax. Multiple osseous metastatic lesions, better visualized
on recent CT.
IMPRESSION: Decreased right pleural effusion after thoracentesis. No evidence of
pneumothorax.

Unchanged right upper lung mass and surrounding lung consolidation.

## 2021-07-28 MED ORDER — AMLODIPINE BESYLATE 5 MG PO TABS
5.0000 mg | ORAL_TABLET | Freq: Every day | ORAL | Status: DC
  Administered 2021-07-28: 5 mg via ORAL
  Filled 2021-07-28: qty 1

## 2021-07-28 MED ORDER — LIDOCAINE HCL 1 % IJ SOLN
INTRAMUSCULAR | Status: AC
  Filled 2021-07-28: qty 20

## 2021-07-28 MED ORDER — ENOXAPARIN SODIUM 40 MG/0.4ML IJ SOSY
40.0000 mg | PREFILLED_SYRINGE | Freq: Every day | INTRAMUSCULAR | Status: DC
  Administered 2021-07-28 – 2021-07-30 (×2): 40 mg via SUBCUTANEOUS
  Filled 2021-07-28 (×3): qty 0.4

## 2021-07-28 MED ORDER — INSULIN GLARGINE-YFGN 100 UNIT/ML ~~LOC~~ SOLN
25.0000 [IU] | Freq: Every day | SUBCUTANEOUS | Status: DC
  Administered 2021-07-28 – 2021-07-29 (×2): 25 [IU] via SUBCUTANEOUS
  Filled 2021-07-28 (×2): qty 0.25

## 2021-07-28 MED ORDER — LIDOCAINE HCL (PF) 1 % IJ SOLN
INTRAMUSCULAR | Status: DC | PRN
  Administered 2021-07-28: 10 mL

## 2021-07-28 MED ORDER — INSULIN ASPART 100 UNIT/ML IJ SOLN
0.0000 [IU] | Freq: Every day | INTRAMUSCULAR | Status: DC
  Administered 2021-07-30: 3 [IU] via SUBCUTANEOUS
  Administered 2021-07-31: 2 [IU] via SUBCUTANEOUS

## 2021-07-28 MED ORDER — INSULIN ASPART 100 UNIT/ML IJ SOLN
0.0000 [IU] | Freq: Three times a day (TID) | INTRAMUSCULAR | Status: DC
  Administered 2021-07-28: 15 [IU] via SUBCUTANEOUS
  Administered 2021-07-28: 4 [IU] via SUBCUTANEOUS
  Administered 2021-07-29: 11 [IU] via SUBCUTANEOUS
  Administered 2021-07-29: 15 [IU] via SUBCUTANEOUS
  Administered 2021-07-30: 20 [IU] via SUBCUTANEOUS
  Administered 2021-07-31 (×2): 7 [IU] via SUBCUTANEOUS
  Administered 2021-08-01 (×2): 4 [IU] via SUBCUTANEOUS
  Administered 2021-08-01: 7 [IU] via SUBCUTANEOUS

## 2021-07-28 MED ORDER — SODIUM ZIRCONIUM CYCLOSILICATE 10 G PO PACK
10.0000 g | PACK | Freq: Two times a day (BID) | ORAL | Status: AC
  Administered 2021-07-28 (×2): 10 g via ORAL
  Filled 2021-07-28 (×2): qty 1

## 2021-07-28 MED ORDER — SODIUM CHLORIDE 0.9 % IV SOLN: INTRAVENOUS | Status: DC

## 2021-07-28 NOTE — Progress Notes (Signed)
   07/28/21 2993  Notify: Provider  Provider Name/Title Jamison Neighbor  Date Provider Notified 07/28/21  Time Provider Notified [phone]  Method of Notification  (secure chat)  Notification Reason Critical result  Provider response No new orders  Date of Provider Response 07/28/21  Time of Provider Response [phone]

## 2021-07-28 NOTE — Progress Notes (Addendum)
PROGRESS NOTE    Michael MCFARLAN  QAS:[id number] DOB: Mar 09, 1965 DOA: 07/20/2021 PCP: Kathyrn Drown, MD    Brief Narrative:   Michael Fritz is a 56 y.o. male with past medical history significant for essential hypertension, insulin-dependent diabetes mellitus, hyperlipidemia, tobacco abuse disorder who presented to [facility] ED by direction of his primary care office for abnormal labs.  Patient reports generalized weakness, unintentional weight loss of 12 pounds over the last several months.  Also complaining of left-sided hip pain that is worse with ambulation.  Patient denies any chest pain, no abdominal pain, no headache, no visual changes.  Day prior, patient went to see his PCP ordered blood work that showed a calcium of 12.9 and was told to come to the ED for further evaluation.  In the ED, temperature 97.7 F, HR 63, RR 17, BP 120/53, SPO2 90% on room air.  WBC 20.1, hemoglobin 10.9, platelets 269.  Sodium 132, potassium 5.3, chloride 99, CO2 25, glucose 318, BUN 28, creatinine 2.28, calcium 14.0, AST 32, ALT 38, total bilirubin 0.8.  Urinalysis unrevealing.  Chest x-ray with stable dense right upper lobe consolidation compatible with pneumonia versus mass.  CT chest/abdomen/pelvis without contrast with mass involving majority of right upper lobe with soft tissue extending along the right upper lobe and right mainstem bronchus along the carina into the AP window concerning for primary lung malignancy, widespread osseous metastatic disease with osseous lesion involving left femoral head and neck cortex which concerning for impending pathologic fracture, moderate right pleural effusion, multiple hypoechoic masses throughout the liver, fullness left adrenal gland concerning for metastases, pathologic compression fracture T5.  Hospitalist service consulted for admission for likely primary lung cancer with metastatic disease, hypercalcemia of malignancy, acute renal failure.  Assessment & Plan:    Right upper lobe mass concerning for primary lung malignancy with metastasis to liver, left adrenal, bone CT chest/abdomen/pelvis without contrast with mass involving majority of right upper lobe with soft tissue extending along the right upper lobe and right mainstem bronchus along the carina into the AP window concerning for primary lung malignancy, widespread osseous metastatic disease with osseous lesion involving left femoral head and neck cortex which concerning for impending pathologic fracture, moderate right pleural effusion, multiple hypoechoic masses throughout the liver, fullness left adrenal gland concerning for metastases, pathologic compression fracture T5. --Oncology consulted, Dr. Alen Blew --S/p thoracentesis 5/24, cytology pending --IR consulted for kyphoplasty/bone biopsy T5 --If cytology/bone biopsy nondiagnostic, may need IR biopsy liver/lung versus PCCM consult for bronchoscopy  Impending pathologic left femur fracture CT pelvis without contrast with focal lytic destruction involving the left femoral neck and head consistent with metastatic disease. --Nonweightbearing left lower extremity --Orthopedics following, Dr. Lyla Glassing plans total hip arthroplasty on 5/25 --N.p.o. after midnight  Pathologic T5 compression fracture --IR consulted for consideration of kyphoplasty with bone biopsy  Hypercalcemia of malignancy Calcium 14.0 on arrival, etiology likely secondary to diffuse malignancy from likely primary lung cancer. --Ca 14.0>13.6 --iPTH: pending PTHrP: pending --s/p pamidronate on 5/24 --NS at 200 mL/h --Calcitonin BID --Decadron 4mg  IV q6h  Acute renal failure Creatinine 2.28 on admission.  Etiology likely secondary to hypercalcemia.  Uric acid level within normal limits.  No urine obstruction noted on CT abdomen/pelvis. --Holding home metformin/ARB --Continue IV fluid hydration as above --Avoid nephrotoxins, renally dose all medications --BMP  daily  Hyperkalemia Potassium 5.3 this morning, likely secondary to acute renal failure. --Lokelma 10 g p.o. twice daily today --repeat BMP in a.m.  Pleural effusion, likely  malignant Underwent thoracentesis on 07/28/2021 by IR with 750 mL of fluid removed. --Cytology: Pending  Hyponatremia Sodium 132 on arrival.  Suspect etiology related to lung cancer with SIADH. --Na 132>133 --Continue IV fluid hydration as above --BMP daily  Leukocytosis Patient is afebrile, WBC count elevated 20.1.  Denies productive cough.  Low suspicion for infectious etiology and holding antibiotics for now. --CBC daily  Type 2 diabetes with hyperglycemia Hemoglobin A1c 12.2 on 07/26/2021, poorly controlled.  Likely complicated by current steroid use for hypercalcemia of malignancy as above.  Home medications include Lantus 55-55 units daily, metformin 500 mg twice daily. --Diabetic educator following, appreciate assistance --Semglee 25u [place] daily --Resistant SSI for coverage --CBGs qAC/HS  Essential hypertension Home medication includes losartan 160 mg p.o. daily. --Holding ARB in the setting of AKI as above --Start amlodipine 5 mg p.o. daily --Hydralazine 25 mg PO q6h PRN SBP >150 or DBP >100 --Continue monitor BP closely  Hyperlipidemia Currently not taking statin at home. --Outpatient follow-up PCP  Tobacco use disorder Counseled on need for complete cessation given new diagnosis of likely metastatic lung cancer. --Nicotine patch   DVT prophylaxis: enoxaparin (LOVENOX) injection 40 mg Start: 07/28/21 1030 Place and maintain sequential compression device Start: 07/31/2021 1646    Code Status: Full Code Family Communication:   Disposition Plan:  Level of care: Med-Surg Status is: Inpatient Remains inpatient appropriate because: Transferring to [facility] long hospital for planned total hip arthroplasty tomorrow, continues with aggressive IV fluids, calcitonin, IV Decadron for hypercalcemia of  malignancy    Consultants:  Oncology, Dr. Alen Blew Orthopedics, Dr. Lyla Glassing Interventional radiology  Procedures:  Thoracentesis 5/24  Antimicrobials:  None   Subjective: Patient seen examined bedside, resting comfortably.  Complaining of back pain and nonproductive cough.  Multiple family members updated.  Discussed case with Dr. Lyla Glassing, orthopedics this morning with plan transfer to [facility] long hospital for operative management for impending pathologic fracture left femur.  No other complaints or concerns at this time.  Denies headache, no visual changes, no chest pain, no palpitations, no shortness of breath, no abdominal pain, no fever/chills/night sweats, no nausea/vomiting/diarrhea, no congestion, no fatigue, no focal weakness, no paresthesias.  No acute events overnight per nursing staff.  Objective: Vitals:   08/01/2021 1937 07/28/21 0100 07/28/21 0451 07/28/21 0734  BP: (!) 165/67 128/67 (!) 174/78 (!) 169/66  Pulse: 66 63 73 76  Resp: 17 17 17    Temp: 98.5 F (36.9 C) 98.2 F (36.8 C) 97.9 F (36.6 C) 97.7 F (36.5 C)  TempSrc: Oral Oral Oral Oral  SpO2: 96% 92% 96% 97%  Weight:      Height:        Intake/Output Summary (Last 24 hours) at 07/28/2021 1235 Last data filed at 07/26/2021 2317 Gross per 24 hour  Intake 240 ml  Output 400 ml  Net -160 ml   Filed Weights   07/18/2021 1032  Weight: 68 kg    Examination:  Physical Exam: GEN: NAD, alert and oriented x 3, wd/wn HEENT: NCAT, PERRL, EOMI, sclera clear, MMM PULM: CTAB w/o wheezes/crackles, normal respiratory effort, on room air CV: RRR w/o M/G/R GI: abd soft, NTND, NABS, no R/G/M MSK: no peripheral edema, muscle strength globally intact 5/5 bilateral upper/lower extremities NEURO: CN II-XII intact, no focal deficits, sensation to light touch intact PSYCH: normal mood/affect Integumentary: dry/intact, no rashes or wounds    Data Reviewed: I have personally reviewed following labs and imaging  studies  CBC: Recent Labs  Lab 07/26/21 1632  07/14/2021 1021 07/28/21 0316  WBC 26.1* 20.1* 20.2*  NEUTROABS  --  16.8*  --   HGB 12.9* 10.9* 10.7*  HCT 39.2 33.6* 33.3*  MCV 88 90.8 89.0  PLT 363 269 637   Basic Metabolic Panel: Recent Labs  Lab 07/26/21 1632 07/07/2021 1021 07/28/21 0316  NA 134 132* 133*  K 5.0 5.3* 5.3*  CL 96 99 103  CO2 25 25 23   GLUCOSE 144* 318* 282*  BUN 31* 38* 44*  CREATININE 1.87* 2.28* 2.35*  CALCIUM 14.9* 14.0* 13.6*   GFR: Estimated Creatinine Clearance: 33.4 mL/min (A) (by C-G formula based on SCr of 2.35 mg/dL (H)). Liver Function Tests: Recent Labs  Lab 07/26/21 1632 07/18/2021 1021  AST 46* 32  ALT 57* 38  ALKPHOS 866* 570*  BILITOT 0.3 0.8  PROT 7.0 5.8*  ALBUMIN 3.5* 2.4*   No results for input(s): LIPASE, AMYLASE in the last 168 hours. No results for input(s): AMMONIA in the last 168 hours. Coagulation Profile: No results for input(s): INR, PROTIME in the last 168 hours. Cardiac Enzymes: Recent Labs  Lab 07/10/2021 1952  CKTOTAL 29*   BNP (last 3 results) No results for input(s): PROBNP in the last 8760 hours. HbA1C: Recent Labs    07/26/21 1632  HGBA1C 12.2*   CBG: Recent Labs  Lab 07/09/2021 1732 07/22/2021 2350 07/28/21 0758  GLUCAP 369* 277* 348*   Lipid Profile: Recent Labs    07/26/21 1632  CHOL 150  HDL 63  LDLCALC 68  TRIG 104  CHOLHDL 2.4   Thyroid Function Tests: No results for input(s): TSH, T4TOTAL, FREET4, T3FREE, THYROIDAB in the last 72 hours. Anemia Panel: No results for input(s): VITAMINB12, FOLATE, FERRITIN, TIBC, IRON, RETICCTPCT in the last 72 hours. Sepsis Labs: No results for input(s): PROCALCITON, LATICACIDVEN in the last 168 hours.  No results found for this or any previous visit (from the past 240 hour(s)).       Radiology Studies: DG Chest 2 View  Result Date: 07/20/2021 CLINICAL DATA:  Dyspnea EXAM: CHEST - 2 VIEW COMPARISON:  Chest radiograph from one day prior.  FINDINGS: Stable cardiomediastinal silhouette with normal heart size. No pneumothorax. Stable trace bilateral pleural effusions. Dense right upper lobe consolidation is stable. Clear left lung. IMPRESSION: 1. Stable dense right upper lobe consolidation compatible with pneumonia. Underlying obstructing right upper lung lesion not excluded. Continued follow-up chest radiographs recommended at a minimum to document resolution. Chest CT with IV contrast may be considered. 2. Stable trace bilateral pleural effusions. Electronically Signed   By: Ilona Sorrel M.D.   On: 07/13/2021 10:56   DG Chest 2 View  Result Date: 07/26/2021 CLINICAL DATA:  Cough EXAM: CHEST - 2 VIEW COMPARISON:  Report 05/13/2011 FINDINGS: Small bilateral pleural effusions. Airspace disease in the right upper lobe. Normal cardiac size. No pneumothorax. IMPRESSION: 1. Airspace disease in the right upper lobe could be due to pneumonia but pulmonary mass or central lesion is also possible. Consider correlation with contrast-enhanced chest CT 2. Small bilateral effusions Electronically Signed   By: Donavan Foil M.D.   On: 07/26/2021 17:18   DG Lumbar Spine Complete  Result Date: 07/26/2021 CLINICAL DATA:  Provided history: Low back pain. Additional history provided: Cough since February, low back pain, bilateral hip pain. EXAM: LUMBAR SPINE - COMPLETE 4+ VIEW COMPARISON:  No pertinent prior exams available for comparison. FINDINGS: Five lumbar vertebrae. The caudal most well-formed intervertebral disc space is designated L5-S1. Minimal grade 1 retrolisthesis at T12-L1, L1-L2,  L2-L3, L3-L4 and L5-S1. No lumbar vertebral compression fracture. No more than mild disc space narrowing within the lumbar spine. Facet arthrosis, greatest at L4-L5 and L5-S1. Large stool burden within the colon and rectum. Aortic atherosclerosis. IMPRESSION: No lumbar vertebral compression fracture. Minimal grade 1 retrolisthesis at T12-L1, L1-L2, L2-L3, L3-L4 and L5-S1.  Lumbar spondylosis, as described. Large stool burden within the colon and rectum, suggesting constipation. Aortic Atherosclerosis (ICD10-I70.0). Electronically Signed   By: Kellie Simmering D.O.   On: 07/26/2021 17:18   CT PELVIS WO CONTRAST  Result Date: 07/28/2021 CLINICAL DATA:  Pelvic fracture. EXAM: CT PELVIS WITHOUT CONTRAST TECHNIQUE: Multidetector CT imaging of the pelvis was performed following the standard protocol without intravenous contrast. RADIATION DOSE REDUCTION: This exam was performed according to the departmental dose-optimization program which includes automated exposure control, adjustment of the mA and/or kV according to patient size and/or use of iterative reconstruction technique. COMPARISON:  Jul 27, 2021. FINDINGS: Urinary Tract: No definite abnormality seen involving the urinary bladder. Bowel:  Unremarkable visualized pelvic bowel loops. Vascular/Lymphatic: No pathologically enlarged lymph nodes. No significant vascular abnormality seen. Reproductive:  Prostate gland is unremarkable. Other:  No definite evidence of hernia or ascites is noted. Musculoskeletal: There is again noted lytic destruction involving the left femoral head and neck anteriorly consistent with metastatic disease. Sclerotic density is seen involving the posterior portion of the left acetabulum as well as iliac bone adjacent to the left sacroiliac joint consistent with metastatic disease. Sclerotic densities are also noted in the visualized lumbar vertebral bodies and sacrum concerning for metastatic disease. IMPRESSION: Focal lytic destruction involving the left femoral neck and head is again noted consistent with metastatic disease. Sclerotic densities are again noted involving the left acetabulum and iliac bone, as well as visualized lumbar vertebral bodies and sacrum concerning for metastatic disease. Electronically Signed   By: Marijo Conception M.D.   On: 07/28/2021 09:33   DG HIPS BILAT WITH PELVIS MIN 5  VIEWS  Result Date: 07/26/2021 CLINICAL DATA:  Hip pain EXAM: DG HIP (WITH OR WITHOUT PELVIS) 5+V BILAT COMPARISON:  None Available. FINDINGS: No fracture or malalignment. SI joints are patent. Pubic symphysis is intact. There are mild degenerative changes of both hips. IMPRESSION: Mild degenerative changes.  No acute osseous abnormality Electronically Signed   By: Donavan Foil M.D.   On: 07/26/2021 17:17   CT CHEST ABDOMEN PELVIS WO CONTRAST  Result Date: 07/11/2021 CLINICAL DATA:  lung mass, weight loss, lumbar/hip pain EXAM: CT CHEST, ABDOMEN AND PELVIS WITHOUT CONTRAST TECHNIQUE: Multidetector CT imaging of the chest, abdomen and pelvis was performed following the standard protocol without IV contrast. RADIATION DOSE REDUCTION: This exam was performed according to the departmental dose-optimization program which includes automated exposure control, adjustment of the mA and/or kV according to patient size and/or use of iterative reconstruction technique. COMPARISON:  None Available. FINDINGS: Evaluation is limited with lack of IV contrast. CT CHEST FINDINGS Cardiovascular: Heart is the upper limits of normal in size. Mild LEFT-sided coronary artery atherosclerotic calcifications. Small pericardial effusion. Aorta is normal in course and caliber. Scattered atherosclerotic calcifications. Mediastinum/Nodes: Visualized thyroid is unremarkable. Mildly prominent LEFT subpectoral axillary lymph node measures 9 mm in the short axis (series 4, image 51). There is mediastinal adenopathy. Representative rounded precarinal lymph node measures 11 mm (series 4, image 74). Evaluation for hilar adenopathy is limited secondary to lack of IV contrast. Ill-defined soft tissue encases the RIGHT mainstem bronchus and extends inferior to the carina and along the  course of the trachea. This ill-defined soft tissue is seen within the AP window. Lungs/Pleura: Moderate RIGHT pleural effusion. Trace LEFT pleural effusion. Irregular  consolidative opacity throughout the majority of the RIGHT upper lobe with interlobular septal thickening. Masslike consolidation centrally measures approximately 7.5 x 6.4 cm (series 6, image 61). It demonstrates mass effect on the RIGHT upper lobe bronchi and occludes them superiorly. Irregular soft tissue extends into the RIGHT middle and lower lobe with ill-defined soft tissue seen coursing along the RIGHT mainstem bronchus. Patchy LEFT basilar irregular centrilobular nodular opacities, nonspecific. Musculoskeletal: Infiltrative osseous metastatic disease is favored to involve every thoracic vertebral body and many of the visualized cervical vertebral bodies. Pathologic compression fracture deformity of T5. Lesions involve multiple bilateral ribs with scattered the pathologic fractures noted including the LEFT-sided fifth rib and RIGHT-sided eleventh rib. There are 13 thoracic type vertebral bodies. CT ABDOMEN PELVIS FINDINGS Hepatobiliary: There are multiple incompletely characterized hypoechoic masses with representative incompletely characterized hypoechoic mass of the RIGHT liver (series 3, image 75). These are incompletely characterized given lack of IV contrast. Gallbladder is unremarkable. Pancreas: No peripancreatic fat stranding. Spleen: Unremarkable. Adrenals/Urinary Tract: Fullness of the LEFT adrenal gland. No hydronephrosis. No obstructing nephrolithiasis. Bladder is unremarkable. Stomach/Bowel: No evidence of bowel obstruction. Moderate colonic stool burden predominately within the RIGHT hemicolon. Appendix is normal. Vascular/Lymphatic: Atherosclerotic calcifications of the nonaneurysmal abdominal aorta. No definitive intra-abdominal adenopathy is identified in the limitations of this noncontrast exam. Reproductive: Prostate is present Other: No free air.  No free fluid. Musculoskeletal: Soft tissue mass involving the LEFT femoral neck and eroding the cortex anteriorly (series 4, image 310). It  places patient at the risk for pathologic fracture. Infiltrative metastatic disease involving the LEFT acetabulum, pelvis, sacrum and all of the lumbar vertebral bodies. IMPRESSION: 1. Mass involving the majority of the RIGHT upper lobe with soft tissue extending along the RIGHT upper lobe and RIGHT mainstem bronchus, along the carina and into the AP window. This is concerning for primary lung malignancy. 2. Widespread osseous metastatic disease. Of note there is an osseous lesion involving the LEFT femoral head and neck cortex which places the patient at risk for impending pathologic fracture. Recommend consultation with Orthopedic surgery. 3. Moderate RIGHT pleural effusion, possibly malignant. 4. Multiple hypoechoic masses throughout the liver, incompletely assessed given lack of IV contrast. These are concerning for hepatic metastatic disease. 5. Fullness of the LEFT adrenal gland, likely adrenal metastatic disease. 6. There may be a postobstructive infectious or atelectatic component within the RIGHT upper lobe. Lymphangitic spread remains a differential consideration. 7. Scattered LEFT basilar centrilobular nodular opacities are nonspecific and could be infectious or inflammatory in etiology versus reflect additional metastatic disease. 8. Pathologic compression fracture of T5. These results were called by telephone at the time of interpretation on 07/05/2021 at 2:26 pm to provider MELANIE BELFI , who verbally acknowledged these results. Electronically Signed   By: Valentino Saxon M.D.   On: 07/25/2021 14:39   IR THORACENTESIS ASP PLEURAL SPACE W/IMG GUIDE  Result Date: 07/28/2021 INDICATION: Patient was recently diagnosed right upper lobe lung mass, right pleural effusion. Request IR for diagnostic and therapeutic right thoracentesis EXAM: ULTRASOUND GUIDED RIGHT THORACENTESIS MEDICATIONS: 6 mL 1% lidocaine COMPLICATIONS: None immediate. PROCEDURE: An ultrasound guided thoracentesis was thoroughly  discussed with the patient and questions answered. The benefits, risks, alternatives and complications were also discussed. The patient understands and wishes to proceed with the procedure. Written consent was obtained. Ultrasound was performed to localize and mark  an adequate pocket of fluid in the right chest. The area was then prepped and draped in the normal sterile fashion. 1% Lidocaine was used for local anesthesia. Under ultrasound guidance a 6 Fr Safe-T-Centesis catheter was introduced. Thoracentesis was performed. The catheter was removed and a dressing applied. FINDINGS: A total of approximately 750 mL of hazy yellow fluid was removed. Samples were sent to the laboratory as requested by the clinical team. IMPRESSION: Successful ultrasound guided right thoracentesis yielding 750 mL of pleural fluid. Read by Candiss Norse, PA-C Electronically Signed   By: Aletta Edouard M.D.   On: 07/28/2021 12:20        Scheduled Meds:  amLODipine  5 mg Oral Daily   calcitonin  4 Units/kg Subcutaneous BID   dexamethasone (DECADRON) injection  4 mg Intravenous Q6H   enoxaparin (LOVENOX) injection  40 mg Subcutaneous Daily   insulin aspart  0-20 Units Subcutaneous TID WC   insulin aspart  0-5 Units Subcutaneous QHS   insulin glargine-yfgn  25 Units Subcutaneous Daily   lidocaine       nicotine  21 mg Transdermal Daily   sodium zirconium cyclosilicate  10 g Oral BID   Continuous Infusions:  sodium chloride       LOS: 1 day    Time spent: 58 minutes spent on chart review, discussion with nursing staff, consultants, updating family and interview/physical exam; more than 50% of that time was spent in counseling and/or coordination of care.    Orvell Careaga J [place] DO [facility] Available via Epic secure chat 7am-7pm After these hours, please refer to coverage provider listed on [email or web address] 07/28/2021, 12:35 PM

## 2021-07-28 NOTE — Progress Notes (Signed)
Inpatient Diabetes Program Recommendations  AACE/ADA: New Consensus Statement on Inpatient Glycemic Control (2015)  Target Ranges:  Prepandial:   less than 140 mg/dL      Peak postprandial:   less than 180 mg/dL (1-2 hours)      Critically ill patients:  140 - 180 mg/dL   Lab Results  Component Value Date   GLUCAP 348 (H) 07/28/2021   HGBA1C 12.2 (H) 07/26/2021    Review of Glycemic Control  Latest Reference Range & Units 07/31/2021 17:32 07/14/2021 23:50 07/28/21 07:58  Glucose-Capillary 70 - 99 mg/dL 369 (H) 277 (H) 348 (H)  (H): Data is abnormally high  Diabetes history: DM2 Outpatient Diabetes medications: Lantus 50-55 units QD Current orders for Inpatient glycemic control: Novolog 0-15 units TID, Decadron 4 mg Q4H  Inpatient Diabetes Program Recommendations:    Semglee 25 units daily Novolog 0-5 units QHS  Will continue to follow while inpatient.  Thank you, Reche Dixon, MSN, [place] Diabetes Coordinator Inpatient Diabetes Program [phone] (team pager from 8a-5p)

## 2021-07-28 NOTE — Procedures (Signed)
PROCEDURE SUMMARY:  Successful image-guided right thoracentesis. Yielded 750 milliliters of hazy yellow fluid. Patient tolerated procedure well. EBL < 1 mL No immediate complications.  Specimen was sent for labs. Post procedure CXR shows no pneumothorax.  Please see imaging section of Epic for full dictation.  Joaquim Nam PA-C 07/28/2021 10:12 AM

## 2021-07-29 ENCOUNTER — Inpatient Hospital Stay (HOSPITAL_COMMUNITY): Payer: 59 | Admitting: Certified Registered Nurse Anesthetist

## 2021-07-29 ENCOUNTER — Encounter (HOSPITAL_COMMUNITY): Admission: EM | Disposition: E | Payer: Self-pay | Source: Home / Self Care | Attending: Internal Medicine

## 2021-07-29 ENCOUNTER — Other Ambulatory Visit: Payer: Self-pay

## 2021-07-29 ENCOUNTER — Inpatient Hospital Stay (HOSPITAL_COMMUNITY): Payer: 59

## 2021-07-29 ENCOUNTER — Encounter (HOSPITAL_COMMUNITY): Payer: Self-pay | Admitting: Internal Medicine

## 2021-07-29 DIAGNOSIS — N179 Acute kidney failure, unspecified: Secondary | ICD-10-CM | POA: Diagnosis not present

## 2021-07-29 DIAGNOSIS — E43 Unspecified severe protein-calorie malnutrition: Secondary | ICD-10-CM | POA: Diagnosis present

## 2021-07-29 DIAGNOSIS — I10 Essential (primary) hypertension: Secondary | ICD-10-CM

## 2021-07-29 DIAGNOSIS — E119 Type 2 diabetes mellitus without complications: Secondary | ICD-10-CM

## 2021-07-29 DIAGNOSIS — M84452A Pathological fracture, left femur, initial encounter for fracture: Secondary | ICD-10-CM

## 2021-07-29 HISTORY — PX: TOTAL HIP ARTHROPLASTY: SHX124

## 2021-07-29 LAB — BASIC METABOLIC PANEL
Anion gap: 6 (ref 5–15)
BUN: 52 mg/dL — ABNORMAL HIGH (ref 6–20)
CO2: 22 mmol/L (ref 22–32)
Calcium: 12.7 mg/dL — ABNORMAL HIGH (ref 8.9–10.3)
Chloride: 104 mmol/L (ref 98–111)
Creatinine, Ser: 2.39 mg/dL — ABNORMAL HIGH (ref 0.61–1.24)
GFR, Estimated: 31 mL/min — ABNORMAL LOW (ref >60)
Glucose, Bld: 223 mg/dL — ABNORMAL HIGH (ref 70–99)
Potassium: 5.2 mmol/L — ABNORMAL HIGH (ref 3.5–5.1)
Sodium: 132 mmol/L — ABNORMAL LOW (ref 135–145)

## 2021-07-29 LAB — GLUCOSE, CAPILLARY
Glucose-Capillary: 345 mg/dL — ABNORMAL HIGH (ref 70–99)
Glucose-Capillary: 287 mg/dL — ABNORMAL HIGH (ref 70–99)
Glucose-Capillary: 199 mg/dL — ABNORMAL HIGH (ref 70–99)
Glucose-Capillary: 267 mg/dL — ABNORMAL HIGH (ref 70–99)

## 2021-07-29 LAB — CBC
HCT: 31.2 % — ABNORMAL LOW (ref 39.0–52.0)
Hemoglobin: 10.4 g/dL — ABNORMAL LOW (ref 13.0–17.0)
MCH: 29.5 pg (ref 26.0–34.0)
MCHC: 33.3 g/dL (ref 30.0–36.0)
MCV: 88.6 fL (ref 80.0–100.0)
Platelets: 248 10*3/uL (ref 150–400)
RBC: 3.52 MIL/uL — ABNORMAL LOW (ref 4.22–5.81)
RDW: 13.3 % (ref 11.5–15.5)
WBC: 18.9 10*3/uL — ABNORMAL HIGH (ref 4.0–10.5)
nRBC: 0 % (ref 0.0–0.2)

## 2021-07-29 LAB — MAGNESIUM: Magnesium: 1.6 mg/dL — ABNORMAL LOW (ref 1.7–2.4)

## 2021-07-29 LAB — SURGICAL PCR SCREEN
MRSA, PCR: NEGATIVE
Staphylococcus aureus: NEGATIVE

## 2021-07-29 LAB — TYPE AND SCREEN
ABO/RH(D): A POS
Antibody Screen: NEGATIVE

## 2021-07-29 LAB — CYTOLOGY - NON PAP

## 2021-07-29 LAB — ABO/RH: ABO/RH(D): A POS

## 2021-07-29 IMAGING — RF DG HIP (WITH OR WITHOUT PELVIS) 1V*L*
1 series · 4 of 4 positions shown · non-contrast
Comparison: [DATE]

CLINICAL DATA: Total hip hemiarthroplasty

EXAM:
DG HIP (WITH OR WITHOUT PELVIS) 1V*L*

[Series 1: unknown protocol · 0.20mm/px · 4 of 4 slices shown]
[im 1/4]
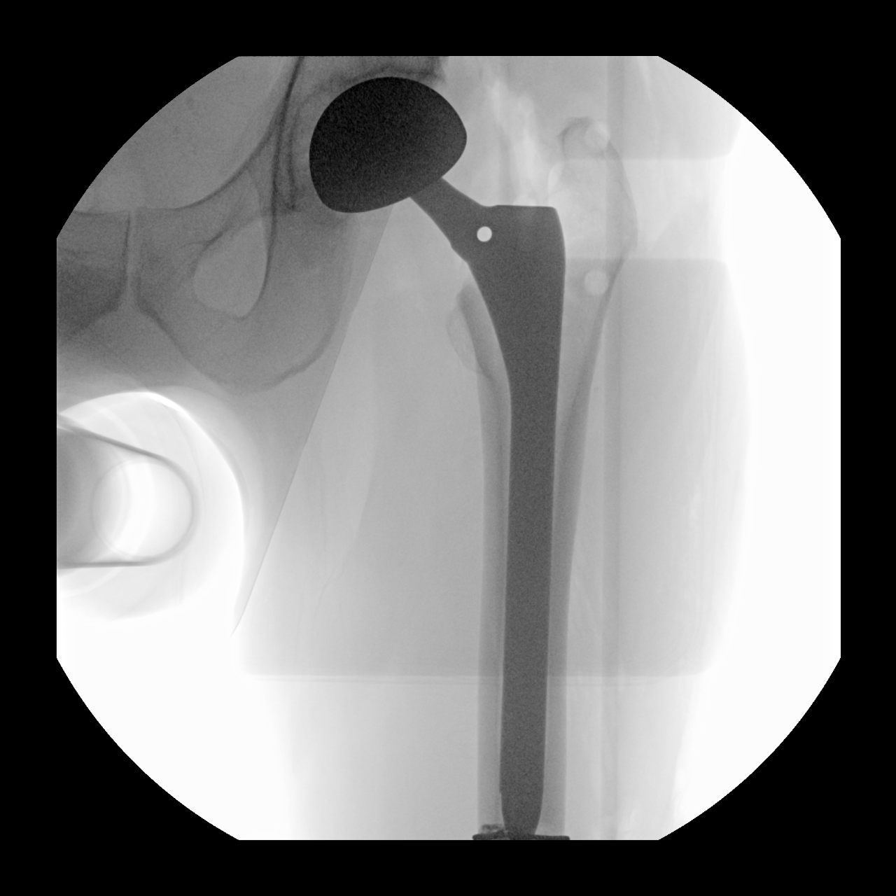
[im 2/4]
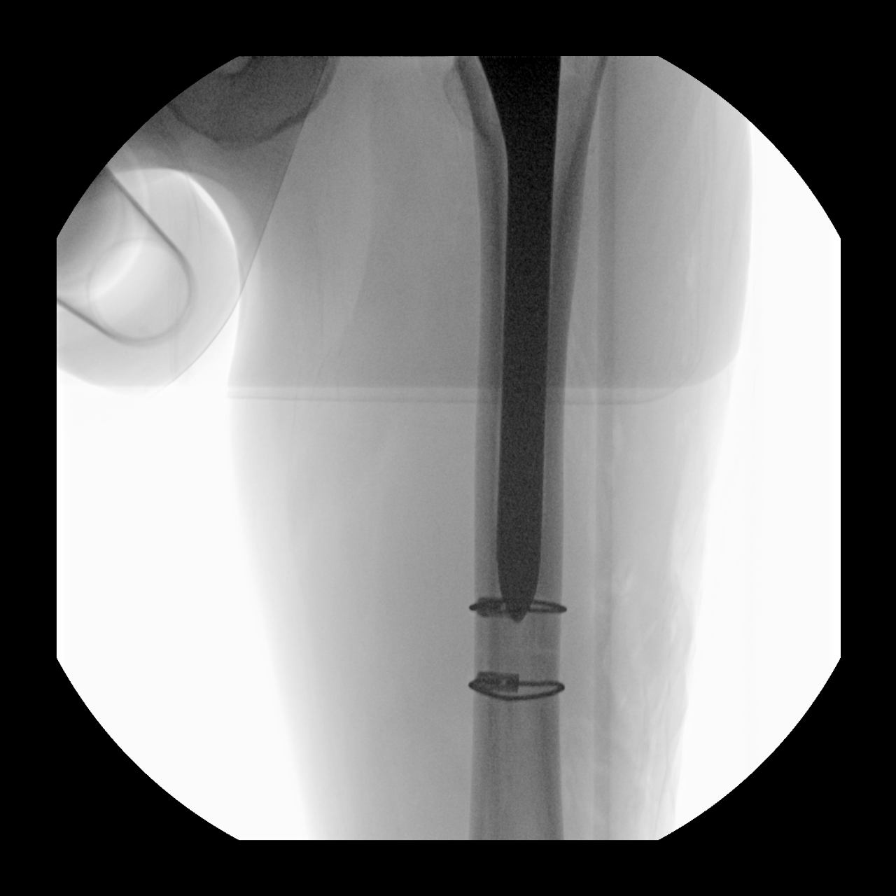
[im 3/4]
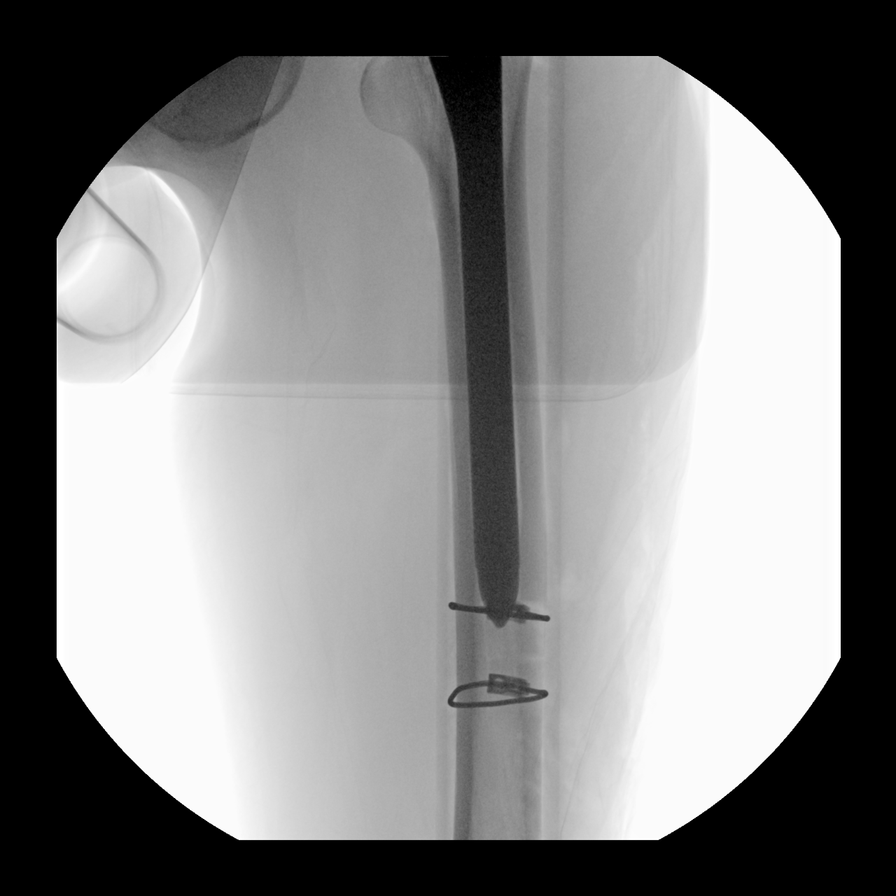
[im 4/4]
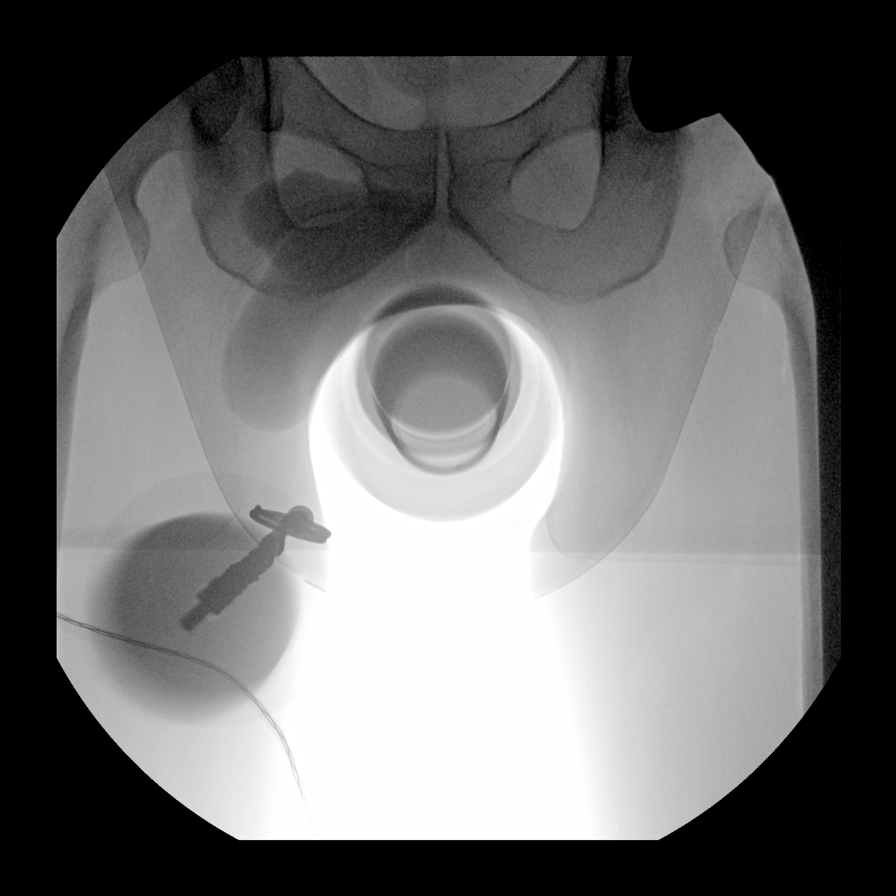

[4 of 4 positions shown; findings below may reference images not displayed]

FINDINGS: Changes of left hip hemiarthroplasty. Normal AP alignment. No
hardware bony complicating feature.
IMPRESSION: Intraoperative imaging as above.  No complicating feature.

## 2021-07-29 SURGERY — ARTHROPLASTY, HIP, TOTAL, ANTERIOR APPROACH
Anesthesia: General | Site: Hip | Laterality: Left

## 2021-07-29 MED ORDER — METHOCARBAMOL 1000 MG/10ML IJ SOLN
500.0000 mg | Freq: Four times a day (QID) | INTRAVENOUS | Status: DC | PRN
  Administered 2021-07-30: 500 mg via INTRAVENOUS
  Filled 2021-07-29: qty 5

## 2021-07-29 MED ORDER — ENOXAPARIN SODIUM 40 MG/0.4ML IJ SOSY: 40.0000 mg | PREFILLED_SYRINGE | INTRAMUSCULAR | Status: DC

## 2021-07-29 MED ORDER — KETOROLAC TROMETHAMINE 30 MG/ML IJ SOLN
INTRAMUSCULAR | Status: DC | PRN
  Administered 2021-07-29: 30 mg

## 2021-07-29 MED ORDER — DEXAMETHASONE SODIUM PHOSPHATE 10 MG/ML IJ SOLN
INTRAMUSCULAR | Status: DC | PRN
  Administered 2021-07-29: 10 mg via INTRAVENOUS

## 2021-07-29 MED ORDER — OXYCODONE HCL 5 MG PO TABS
5.0000 mg | ORAL_TABLET | ORAL | Status: DC | PRN
  Administered 2021-07-30 – 2021-08-01 (×8): 5 mg via ORAL
  Filled 2021-07-29 (×9): qty 1

## 2021-07-29 MED ORDER — INSULIN GLARGINE-YFGN 100 UNIT/ML ~~LOC~~ SOLN
22.0000 [IU] | Freq: Two times a day (BID) | SUBCUTANEOUS | Status: DC
  Administered 2021-07-30 – 2021-08-01 (×6): 22 [IU] via SUBCUTANEOUS
  Filled 2021-07-29 (×13): qty 0.22

## 2021-07-29 MED ORDER — BUPIVACAINE-EPINEPHRINE 0.25% -1:200000 IJ SOLN
INTRAMUSCULAR | Status: DC | PRN
  Administered 2021-07-29: 30 mL

## 2021-07-29 MED ORDER — MIDAZOLAM HCL 2 MG/2ML IJ SOLN
INTRAMUSCULAR | Status: AC
  Filled 2021-07-29: qty 2

## 2021-07-29 MED ORDER — FENTANYL CITRATE (PF) 100 MCG/2ML IJ SOLN
INTRAMUSCULAR | Status: AC
Start: 2021-07-29 — End: ?
  Filled 2021-07-29: qty 2

## 2021-07-29 MED ORDER — ONDANSETRON HCL 4 MG PO TABS: 4.0000 mg | ORAL_TABLET | Freq: Four times a day (QID) | ORAL | Status: DC | PRN

## 2021-07-29 MED ORDER — ONDANSETRON HCL 4 MG/2ML IJ SOLN: 4.0000 mg | Freq: Once | INTRAMUSCULAR | Status: DC | PRN

## 2021-07-29 MED ORDER — PROPOFOL 10 MG/ML IV BOLUS
INTRAVENOUS | Status: DC | PRN
  Administered 2021-07-29: 150 mg via INTRAVENOUS
  Administered 2021-07-29: 25 ug/kg/min via INTRAVENOUS

## 2021-07-29 MED ORDER — LACTATED RINGERS IV SOLN: INTRAVENOUS | Status: DC

## 2021-07-29 MED ORDER — DOCUSATE SODIUM 100 MG PO CAPS
100.0000 mg | ORAL_CAPSULE | Freq: Two times a day (BID) | ORAL | Status: DC
  Administered 2021-07-30 – 2021-07-31 (×5): 100 mg via ORAL
  Filled 2021-07-29 (×5): qty 1

## 2021-07-29 MED ORDER — WATER FOR IRRIGATION, STERILE IR SOLN
Status: DC | PRN
  Administered 2021-07-29: 2000 mL

## 2021-07-29 MED ORDER — SODIUM CHLORIDE (PF) 0.9 % IJ SOLN
INTRAMUSCULAR | Status: DC | PRN
  Administered 2021-07-29: 30 mL

## 2021-07-29 MED ORDER — ROCURONIUM BROMIDE 10 MG/ML (PF) SYRINGE
PREFILLED_SYRINGE | INTRAVENOUS | Status: AC
  Filled 2021-07-29: qty 40

## 2021-07-29 MED ORDER — METOCLOPRAMIDE HCL 5 MG PO TABS: 5.0000 mg | ORAL_TABLET | Freq: Three times a day (TID) | ORAL | Status: DC | PRN

## 2021-07-29 MED ORDER — ROCURONIUM BROMIDE 10 MG/ML (PF) SYRINGE
PREFILLED_SYRINGE | INTRAVENOUS | Status: DC | PRN
  Administered 2021-07-29 (×3): 20 mg via INTRAVENOUS
  Administered 2021-07-29: 60 mg via INTRAVENOUS

## 2021-07-29 MED ORDER — FENTANYL CITRATE (PF) 100 MCG/2ML IJ SOLN
INTRAMUSCULAR | Status: AC
  Filled 2021-07-29: qty 2

## 2021-07-29 MED ORDER — CHLORHEXIDINE GLUCONATE 4 % EX LIQD: 60.0000 mL | Freq: Once | CUTANEOUS | Status: DC

## 2021-07-29 MED ORDER — BUPIVACAINE-EPINEPHRINE (PF) 0.25% -1:200000 IJ SOLN
INTRAMUSCULAR | Status: AC
  Filled 2021-07-29: qty 30

## 2021-07-29 MED ORDER — MAGNESIUM SULFATE 2 GM/50ML IV SOLN
2.0000 g | Freq: Once | INTRAVENOUS | Status: AC
  Administered 2021-07-29: 2 g via INTRAVENOUS
  Filled 2021-07-29 (×2): qty 50

## 2021-07-29 MED ORDER — METHOCARBAMOL 500 MG PO TABS
500.0000 mg | ORAL_TABLET | Freq: Four times a day (QID) | ORAL | Status: DC | PRN
  Administered 2021-07-30 – 2021-08-01 (×3): 500 mg via ORAL
  Filled 2021-07-29 (×3): qty 1

## 2021-07-29 MED ORDER — FENTANYL CITRATE PF 50 MCG/ML IJ SOSY
PREFILLED_SYRINGE | INTRAMUSCULAR | Status: AC
  Filled 2021-07-29: qty 1

## 2021-07-29 MED ORDER — DEXAMETHASONE SODIUM PHOSPHATE 10 MG/ML IJ SOLN: INTRAMUSCULAR | Status: DC | PRN

## 2021-07-29 MED ORDER — KETOROLAC TROMETHAMINE 30 MG/ML IJ SOLN
INTRAMUSCULAR | Status: AC
  Filled 2021-07-29: qty 1

## 2021-07-29 MED ORDER — ISOPROPYL ALCOHOL 70 % SOLN
Status: AC
  Filled 2021-07-29: qty 480

## 2021-07-29 MED ORDER — METOCLOPRAMIDE HCL 5 MG/ML IJ SOLN: 5.0000 mg | Freq: Three times a day (TID) | INTRAMUSCULAR | Status: DC | PRN

## 2021-07-29 MED ORDER — EPHEDRINE SULFATE-NACL 50-0.9 MG/10ML-% IV SOSY
PREFILLED_SYRINGE | INTRAVENOUS | Status: DC | PRN
  Administered 2021-07-29 (×2): 10 mg via INTRAVENOUS

## 2021-07-29 MED ORDER — ALBUMIN HUMAN 5 % IV SOLN
INTRAVENOUS | Status: AC
  Filled 2021-07-29: qty 250

## 2021-07-29 MED ORDER — POVIDONE-IODINE 10 % EX SWAB: 2.0000 "application " | Freq: Once | CUTANEOUS | Status: AC

## 2021-07-29 MED ORDER — OXYCODONE HCL 5 MG/5ML PO SOLN: 5.0000 mg | Freq: Once | ORAL | Status: DC | PRN

## 2021-07-29 MED ORDER — LIDOCAINE HCL (PF) 1 % IJ SOLN
INTRAMUSCULAR | Status: AC
  Filled 2021-07-29: qty 30

## 2021-07-29 MED ORDER — FENTANYL CITRATE PF 50 MCG/ML IJ SOSY
PREFILLED_SYRINGE | INTRAMUSCULAR | Status: AC
  Administered 2021-07-29: 50 ug via INTRAVENOUS
  Filled 2021-07-29: qty 1

## 2021-07-29 MED ORDER — SODIUM CHLORIDE 0.9 % IR SOLN
Status: DC | PRN
  Administered 2021-07-29: 2000 mL

## 2021-07-29 MED ORDER — MIDAZOLAM HCL 2 MG/2ML IJ SOLN
INTRAMUSCULAR | Status: DC | PRN
  Administered 2021-07-29: 2 mg via INTRAVENOUS

## 2021-07-29 MED ORDER — CHLORHEXIDINE GLUCONATE 0.12 % MT SOLN
15.0000 mL | Freq: Once | OROMUCOSAL | Status: AC
  Administered 2021-07-29: 15 mL via OROMUCOSAL

## 2021-07-29 MED ORDER — SENNA 8.6 MG PO TABS
1.0000 | ORAL_TABLET | Freq: Two times a day (BID) | ORAL | Status: DC
  Administered 2021-07-30 – 2021-07-31 (×5): 8.6 mg via ORAL
  Filled 2021-07-29 (×5): qty 1

## 2021-07-29 MED ORDER — CEFAZOLIN SODIUM-DEXTROSE 2-4 GM/100ML-% IV SOLN
2.0000 g | INTRAVENOUS | Status: AC
  Administered 2021-07-29: 2 g via INTRAVENOUS
  Filled 2021-07-29: qty 100

## 2021-07-29 MED ORDER — AMLODIPINE BESYLATE 10 MG PO TABS
10.0000 mg | ORAL_TABLET | Freq: Every day | ORAL | Status: DC
  Administered 2021-07-29 – 2021-08-01 (×4): 10 mg via ORAL
  Filled 2021-07-29 (×4): qty 1

## 2021-07-29 MED ORDER — ONDANSETRON HCL 4 MG/2ML IJ SOLN
4.0000 mg | Freq: Four times a day (QID) | INTRAMUSCULAR | Status: DC | PRN
  Filled 2021-07-29: qty 2

## 2021-07-29 MED ORDER — SODIUM CHLORIDE 0.9 % IV SOLN: INTRAVENOUS | Status: DC

## 2021-07-29 MED ORDER — ACETAMINOPHEN 500 MG PO TABS
1000.0000 mg | ORAL_TABLET | Freq: Once | ORAL | Status: AC
  Administered 2021-07-29: 1000 mg via ORAL
  Filled 2021-07-29: qty 2

## 2021-07-29 MED ORDER — SUGAMMADEX SODIUM 200 MG/2ML IV SOLN
INTRAVENOUS | Status: DC | PRN
  Administered 2021-07-29: 200 mg via INTRAVENOUS

## 2021-07-29 MED ORDER — ALBUMIN HUMAN 5 % IV SOLN: INTRAVENOUS | Status: DC | PRN

## 2021-07-29 MED ORDER — LIDOCAINE 2% (20 MG/ML) 5 ML SYRINGE
INTRAMUSCULAR | Status: DC | PRN
  Administered 2021-07-29: 60 mg via INTRAVENOUS

## 2021-07-29 MED ORDER — PHENYLEPHRINE 80 MCG/ML (10ML) SYRINGE FOR IV PUSH (FOR BLOOD PRESSURE SUPPORT)
PREFILLED_SYRINGE | INTRAVENOUS | Status: AC
  Filled 2021-07-29: qty 10

## 2021-07-29 MED ORDER — OXYCODONE HCL 5 MG PO TABS: 5.0000 mg | ORAL_TABLET | Freq: Once | ORAL | Status: DC | PRN

## 2021-07-29 MED ORDER — CEFAZOLIN SODIUM-DEXTROSE 2-4 GM/100ML-% IV SOLN
2.0000 g | Freq: Four times a day (QID) | INTRAVENOUS | Status: AC
  Administered 2021-07-30 (×2): 2 g via INTRAVENOUS
  Filled 2021-07-29 (×2): qty 100

## 2021-07-29 MED ORDER — PHENOL 1.4 % MT LIQD: 1.0000 | OROMUCOSAL | Status: DC | PRN

## 2021-07-29 MED ORDER — FENTANYL CITRATE (PF) 100 MCG/2ML IJ SOLN
INTRAMUSCULAR | Status: DC | PRN
  Administered 2021-07-29 (×6): 50 ug via INTRAVENOUS

## 2021-07-29 MED ORDER — ACETAMINOPHEN 500 MG PO TABS: 1000.0000 mg | ORAL_TABLET | Freq: Once | ORAL | Status: DC

## 2021-07-29 MED ORDER — POLYETHYLENE GLYCOL 3350 17 G PO PACK: 17.0000 g | PACK | Freq: Every day | ORAL | Status: DC | PRN

## 2021-07-29 MED ORDER — TRANEXAMIC ACID-NACL 1000-0.7 MG/100ML-% IV SOLN
1000.0000 mg | INTRAVENOUS | Status: AC
  Administered 2021-07-29: 1000 mg via INTRAVENOUS
  Filled 2021-07-29: qty 100

## 2021-07-29 MED ORDER — PHENYLEPHRINE 80 MCG/ML (10ML) SYRINGE FOR IV PUSH (FOR BLOOD PRESSURE SUPPORT)
PREFILLED_SYRINGE | INTRAVENOUS | Status: DC | PRN
  Administered 2021-07-29 (×4): 80 ug via INTRAVENOUS

## 2021-07-29 MED ORDER — SODIUM CHLORIDE (PF) 0.9 % IJ SOLN
INTRAMUSCULAR | Status: AC
  Filled 2021-07-29: qty 30

## 2021-07-29 MED ORDER — ISOPROPYL ALCOHOL 70 % SOLN
Status: DC | PRN
  Administered 2021-07-29: 1 via TOPICAL

## 2021-07-29 MED ORDER — MENTHOL 3 MG MT LOZG: 1.0000 | LOZENGE | OROMUCOSAL | Status: DC | PRN

## 2021-07-29 MED ORDER — ALUM & MAG HYDROXIDE-SIMETH 200-200-20 MG/5ML PO SUSP
30.0000 mL | ORAL | Status: DC | PRN
  Administered 2021-07-30 – 2021-07-31 (×5): 30 mL via ORAL
  Filled 2021-07-29 (×5): qty 30

## 2021-07-29 MED ORDER — SODIUM ZIRCONIUM CYCLOSILICATE 10 G PO PACK
10.0000 g | PACK | Freq: Two times a day (BID) | ORAL | Status: DC
  Administered 2021-07-29: 10 g via ORAL
  Filled 2021-07-29 (×2): qty 1

## 2021-07-29 MED ORDER — ORAL CARE MOUTH RINSE: 15.0000 mL | Freq: Once | OROMUCOSAL | Status: AC

## 2021-07-29 MED ORDER — DIPHENHYDRAMINE HCL 12.5 MG/5ML PO ELIX: 12.5000 mg | ORAL_SOLUTION | ORAL | Status: DC | PRN

## 2021-07-29 MED ORDER — FENTANYL CITRATE PF 50 MCG/ML IJ SOSY
25.0000 ug | PREFILLED_SYRINGE | INTRAMUSCULAR | Status: DC | PRN
  Administered 2021-07-29 (×3): 50 ug via INTRAVENOUS

## 2021-07-29 MED ORDER — POVIDONE-IODINE 10 % EX SWAB
2.0000 "application " | Freq: Once | CUTANEOUS | Status: AC
  Administered 2021-07-29: 2 via TOPICAL

## 2021-07-29 SURGICAL SUPPLY — 73 items
ADH SKN CLS APL DERMABOND .7 (GAUZE/BANDAGES/DRESSINGS) ×1
APL PRP STRL LF DISP 70% ISPRP (MISCELLANEOUS) ×1
BAG COUNTER SPONGE SURGICOUNT (BAG) IMPLANT
BAG DECANTER FOR FLEXI CONT (MISCELLANEOUS) IMPLANT
BAG SPEC THK2 15X12 ZIP CLS (MISCELLANEOUS)
BAG SPNG CNTER NS LX DISP (BAG)
BAG ZIPLOCK 12X15 (MISCELLANEOUS) IMPLANT
BLADE SAW SGTL 81X20 HD (BLADE) ×1 IMPLANT
CABLE CERLAGE W/CRIMP 1.8 (Cable) ×2 IMPLANT
CHLORAPREP W/TINT 26 (MISCELLANEOUS) ×2 IMPLANT
COVER PERINEAL POST (MISCELLANEOUS) ×2 IMPLANT
COVER SURGICAL LIGHT HANDLE (MISCELLANEOUS) ×2 IMPLANT
DERMABOND ADVANCED (GAUZE/BANDAGES/DRESSINGS) ×1
DERMABOND ADVANCED .7 DNX12 (GAUZE/BANDAGES/DRESSINGS) ×2 IMPLANT
DRAPE IMP U-DRAPE 54X76 (DRAPES) ×2 IMPLANT
DRAPE SHEET LG 3/4 BI-LAMINATE (DRAPES) ×6 IMPLANT
DRAPE STERI IOBAN 125X83 (DRAPES) ×2 IMPLANT
DRAPE U-SHAPE 47X51 STRL (DRAPES) ×4 IMPLANT
DRESSING AQUACEL AG SP 3.5X4 (GAUZE/BANDAGES/DRESSINGS) IMPLANT
DRSG AQUACEL AG ADV 3.5X 6 (GAUZE/BANDAGES/DRESSINGS) ×1 IMPLANT
DRSG AQUACEL AG ADV 3.5X10 (GAUZE/BANDAGES/DRESSINGS) ×2 IMPLANT
DRSG AQUACEL AG SP 3.5X4 (GAUZE/BANDAGES/DRESSINGS)
DRSG TELFA 3X8 NADH (GAUZE/BANDAGES/DRESSINGS) ×2 IMPLANT
ELECT BLADE TIP CTD 4 INCH (ELECTRODE) ×1 IMPLANT
ELECT REM PT RETURN 15FT ADLT (MISCELLANEOUS) ×2 IMPLANT
GAUZE SPONGE 4X4 12PLY STRL (GAUZE/BANDAGES/DRESSINGS) ×2 IMPLANT
GLOVE BIO SURGEON STRL SZ8.5 (GLOVE) ×5 IMPLANT
GLOVE BIOGEL M 7.0 STRL (GLOVE) ×2 IMPLANT
GLOVE BIOGEL PI IND STRL 7.5 (GLOVE) ×1 IMPLANT
GLOVE BIOGEL PI IND STRL 8 (GLOVE) ×1 IMPLANT
GLOVE BIOGEL PI IND STRL 8.5 (GLOVE) ×1 IMPLANT
GLOVE BIOGEL PI INDICATOR 7.5 (GLOVE) ×1
GLOVE BIOGEL PI INDICATOR 8 (GLOVE) ×2
GLOVE BIOGEL PI INDICATOR 8.5 (GLOVE) ×1
GLOVE ECLIPSE 8.0 STRL XLNG CF (GLOVE) ×1 IMPLANT
GLOVE SURG LX 7.5 STRW (GLOVE) ×2
GLOVE SURG LX STRL 7.5 STRW (GLOVE) ×2 IMPLANT
GOWN SPEC L3 XXLG W/TWL (GOWN DISPOSABLE) ×4 IMPLANT
HANDPIECE INTERPULSE COAX TIP (DISPOSABLE) ×2
HEAD MOD COCR 28MM HD +3MM NK (Orthopedic Implant) ×1 IMPLANT
HOLDER FOLEY CATH W/STRAP (MISCELLANEOUS) ×2 IMPLANT
HOOD PEEL AWAY FLYTE STAYCOOL (MISCELLANEOUS) ×6 IMPLANT
KIT TURNOVER KIT A (KITS) ×1 IMPLANT
MANIFOLD NEPTUNE II (INSTRUMENTS) ×2 IMPLANT
MARKER SKIN DUAL TIP RULER LAB (MISCELLANEOUS) ×2 IMPLANT
NDL SAFETY ECLIPSE 18X1.5 (NEEDLE) ×1 IMPLANT
NDL SPNL 18GX3.5 QUINCKE PK (NEEDLE) ×1 IMPLANT
NEEDLE HYPO 18GX1.5 SHARP (NEEDLE) ×2
NEEDLE SPNL 18GX3.5 QUINCKE PK (NEEDLE) ×2 IMPLANT
PACK ANTERIOR HIP CUSTOM (KITS) ×2 IMPLANT
PAD DRESSING TELFA 3X8 NADH (GAUZE/BANDAGES/DRESSINGS) IMPLANT
PENCIL SMOKE EVACUATOR (MISCELLANEOUS) ×1 IMPLANT
RINGBLOC BI POLAR 28X51MM (Orthopedic Implant) ×2 IMPLANT
SAW OSC TIP CART 19.5X105X1.3 (SAW) ×2 IMPLANT
SEALER BIPOLAR AQUA 6.0 (INSTRUMENTS) ×2 IMPLANT
SET HNDPC FAN SPRY TIP SCT (DISPOSABLE) ×1 IMPLANT
SHELL RINGBLOC BI POLR 28X51MM (Orthopedic Implant) IMPLANT
SOLUTION PRONTOSAN WOUND 350ML (IRRIGATION / IRRIGATOR) ×2 IMPLANT
SPIKE FLUID TRANSFER (MISCELLANEOUS) ×1 IMPLANT
STAPLER INSORB 30 2030 C-SECTI (MISCELLANEOUS) IMPLANT
SUT MNCRL AB 3-0 PS2 18 (SUTURE) ×2 IMPLANT
SUT MON AB 2-0 CT1 36 (SUTURE) ×2 IMPLANT
SUT STRATAFIX PDO 1 14 VIOLET (SUTURE) ×4
SUT STRATFX PDO 1 14 VIOLET (SUTURE) ×2
SUT VIC AB 1 CT1 36 (SUTURE) ×1 IMPLANT
SUT VIC AB 2-0 CT1 27 (SUTURE)
SUT VIC AB 2-0 CT1 TAPERPNT 27 (SUTURE) IMPLANT
SUTURE STRATFX PDO 1 14 VIOLET (SUTURE) ×1 IMPLANT
SYR 3ML LL SCALE MARK (SYRINGE) ×2 IMPLANT
SYS ARCOS ONEPIECE REV 15X210 (Hips) ×2 IMPLANT
SYSTEM ARCOS ONEPC REV 15X210 (Hips) IMPLANT
TRAY FOLEY MTR SLVR 16FR STAT (SET/KITS/TRAYS/PACK) ×1 IMPLANT
TUBE SUCTION HIGH CAP CLEAR NV (SUCTIONS) ×2 IMPLANT

## 2021-07-29 NOTE — Progress Notes (Signed)
Spoke with [facility] for patients transport from [facility] to [facility] for surgery with Dr Lyla Glassing.  Patient is scheduled to be picked up at 1430 this afternoon

## 2021-07-29 NOTE — Discharge Instructions (Signed)

## 2021-07-29 NOTE — Anesthesia Procedure Notes (Signed)
Procedure Name: Intubation Date/Time: 07/11/2021 4:30 PM Performed by: Gerald Leitz, CRNA Pre-anesthesia Checklist: Patient identified, Patient being monitored, Timeout performed, Emergency Drugs available and Suction available Patient Re-evaluated:Patient Re-evaluated prior to induction Oxygen Delivery Method: Circle system utilized Preoxygenation: Pre-oxygenation with 100% oxygen Induction Type: IV induction Ventilation: Mask ventilation without difficulty Laryngoscope Size: Mac and 3 Grade View: Grade I Tube type: Oral Tube size: 7.5 mm Number of attempts: 1 Airway Equipment and Method: Stylet Placement Confirmation: ETT inserted through vocal cords under direct vision, positive ETCO2 and breath sounds checked- equal and bilateral Secured at: 22 cm Tube secured with: Tape Dental Injury: Teeth and Oropharynx as per pre-operative assessment

## 2021-07-29 NOTE — Transfer of Care (Signed)
Immediate Anesthesia Transfer of Care Note  Patient: Michael Fritz  Procedure(s) Performed: Procedure(s): TOTAL HIP HEMI ARTHROPLASTY ANTERIOR APPROACH (Left)  Patient Location: PACU  Anesthesia Type:General  Level of Consciousness: Alert, Awake, Oriented  Airway & Oxygen Therapy: Patient Spontanous Breathing  Post-op Assessment: Report given to RN  Post vital signs: Reviewed and stable  Last Vitals:  Vitals:   07/14/2021 1518 07/12/2021 2045  BP: (!) 152/69 (!) 112/50  Pulse: 65 74  Resp: 16 12  Temp: 36.6 C 36.4 C  SpO2: 68%     Complications: No apparent anesthesia complications

## 2021-07-29 NOTE — Interval H&P Note (Signed)
History and Physical Interval Note:  07/13/2021 4:16 PM  Michael Fritz  has presented today for surgery, with the diagnosis of Pathologic Left Hip Fracture.  The various methods of treatment have been discussed with the patient and family. After consideration of risks, benefits and other options for treatment, the patient has consented to  Procedure(s): TOTAL HIP ARTHROPLASTY ANTERIOR APPROACH (Left) & excisional biopsy as a surgical intervention.  The patient's history has been reviewed, patient examined, no change in status, stable for surgery.  I have reviewed the patient's chart and labs.  Questions were answered to the patient's satisfaction.    Denies R thigh/hip pain - will monitor. Plan for postop XRT to B/L proximal femora   [facility]

## 2021-07-29 NOTE — Op Note (Signed)
OPERATIVE REPORT  SURGEON: Rod Can, MD   ASSISTANT: Larene Pickett, PA-C  PREOPERATIVE DIAGNOSIS: Impending pathologic left femoral neck fracture.  POSTOPERATIVE DIAGNOSIS: Same  PROCEDURE: Left hip hemiarthroplasty, anterior approach.  Excisional biopsy left femoral head and neck.  IMPLANTS: Biomet Arcos 1 piece stem, size 15 x 210 mm, high offset, with a 28 + 3 mm metal head ball and 51 mm bipolar bearing. Zimmer 1.8 mm adult reconstruction cable x2.   ANESTHESIA:  General  ANTIBIOTICS: 2g ancef.  ESTIMATED BLOOD LOSS:-250 mL    DRAINS: None.  COMPLICATIONS: None   CONDITION: PACU - hemodynamically stable.   BRIEF CLINICAL NOTE: Michael Fritz is a 56 y.o. male with a approximate 1 month history of worsening back pain and left hip pain.  He presented to the ER with weakness and shortness of breath.  He was found to have hypercalcemia.  CT of the chest abdomen and pelvis showed a lung mass with metastatic lesions in the pelvis, spine, and left femoral neck.  MRI of the left femur was obtained showing that he had metastatic involvement for approximately 5 inches of the proximal femoral shaft.  He was admitted by the hospitalist service for perioperative restratification and medical optimization.  He was indicated for excisional biopsy and left hip hemiarthroplasty.  We discussed the risk, benefits, and alternatives, and he elected to proceed.  PROCEDURE IN DETAIL: The patient was taken to the operating room and general anesthesia was induced on the hospital bed.  The patient was then positioned on the Hana table.  All bony prominences were well padded.  The hip was prepped and draped in the normal sterile surgical fashion.  A time-out was called verifying side and site of surgery. Antibiotics were given within 60 minutes of beginning the procedure.   Bikini incision was made, and the direct anterior approach to the hip was performed through the Hueter interval.  Superficial  dissection was carried out lateral to the ASIS. Lateral femoral circumflex vessels were treated with the Auqumantys. The anterior capsule was exposed and an inverted T capsulotomy was made.  Synovial fluid was noted to be straw-colored and clear.  There was severe cortical thinning over the anterior subcapital femoral neck.  Inferior pubofemoral ligament was released subperiosteally to the lesser trochanter.  Femoral neck cut was made with a saw. A corkscrew was placed into the head and the head was removed.  This was passed to the back table and was measured.  The femoral head and neck remnant was sent for frozen section, and the pathologist called stating that it was metastatic carcinoma.  I then proceeded with hemiarthroplasty.   Acetabular exposure was achieved.  I examined the articular cartilage which was intact.  There were no defects or evidence of cancer within the cartilage.  The labrum was intact. A 51 mm trial head was placed and found to have excellent fit.   I then gained femoral exposure taking care to protect the abductors and greater trochanter.  The superior capsule was incised longitudinally, staying lateral to the posterior border of the femoral neck. External rotation, extension, and adduction were applied.  A cookie cutter was used to enter the femoral canal, and then the femoral canal finder was used to confirm location.  I then sequentially reamed up to a 14.5 mm reamer and broached up to a size 15.  Calcar planer was used on the femoral neck remnant.  I placed a high offset neck and a 28 x 51 mm  bipolar trial head ball. The hip was reduced.  Leg lengths were checked fluoroscopically.  The hip was dislocated and the trial head ball and neck were removed.  While trying to remove the femoral trial, the broach handle kept disengaging, and I was unable to remove it in this manner.  Using fluoroscopy to localize the tip of the trial stem, I made a longitudinal incision over the lateral aspect  of the femur.  Blunt digital dissection was performed.  The IT band was split in line with fibers.  I retracted the vastus lateralis anteriorly.  Using an ACL saw, I made a cortical window.  A bone tamp was placed and used to dislodge the trial femoral component.  The cortical bone was placed back into the femur, and I placed 2 subperiosteal adult reconstruction cables. I placed the real stem followed by the bipolar construct.  A single reduction maneuver was performed and the hip was reduced.  Fluoroscopy was used to confirm component position and leg lengths.  At 90 degrees of external rotation and extension, the hip was stable to an anterior directed force.   The wound was copiously irrigated with Irrisept solution and normal saline using pulse lavage.  Marcaine solution was injected into the periarticular soft tissue.  The wound was closed in layers using #1 Stratafix for the fascia, #1 Vicryl for the IT band distally, 2-0 Vicryl for the subcutaneous fat, 2-0 Monocryl for the deep dermal layer, and staples plus Dermabond for the skin.  Once the glue was fully dried, an Aquacell Ag dressing was applied.  The patient was then awakened from anesthesia and transported to the recovery room in stable condition.  Sponge, needle, and instrument counts were correct at the end of the case x2.  The patient tolerated the procedure well and there were no known complications.  Please note that a surgical assistant was a medical necessity for this procedure to perform it in a safe and expeditious manner. Assistant was necessary to provide appropriate retraction of vital neurovascular structures, to prevent femoral fracture, and to allow for anatomic placement of the prosthesis.  POSTOPERATIVE PLAN: Per hospitalist request, the patient will be transferred from [facility] back to [facility] for future interventional radiology procedure.  He may weight-bear as tolerated left lower extremity with a walker.  We  will begin Lovenox for DVT prophylaxis.  Follow surgical pathology report.  Once his wounds are healed, I recommend radiation therapy to the right subtrochanteric region of the femur as well as the left proximal femur.  If he develops right hip pain, he will likely need a prophylactic cephalomedullary nail.  Follow-up in the office for routine 2-week postop visit.

## 2021-07-29 NOTE — Progress Notes (Signed)
Chief Complaint: Patient was seen in consultation today for back pain related to pathologic fractures   Referring Physician(s): Dr. Eric [place]  Supervising Physician: Luanne Bras  Patient Status: [facility] - In-pt  History of Present Illness: Michael Fritz is a 56 y.o. male with primary c/o back and left hip pain as well as some SOB. He is found to have large right lung mass with associated effusion as well as widespread osseous mets. Particularly, there is evidence of pathologic fractures at T5 and T7 as well as lesion of the left with impending fracture. He underwent right thoracentesis 5/24 and states his breathing is some better. He is scheduled for surgery today for the left hip.  IR is asked to eval the pathologic spine fractures for biopsy and kyphoplasty. Imaging reviewed with Dr. Estanislado Pandy Pt seen today, family at bedside. Primary c/o mid back pain and left hip pain. Able to sit up by himself, though significant pain to do so.    Past Medical History:  Diagnosis Date   Adhesive capsulitis of right shoulder 01/17/2014   Diabetes mellitus without complication ([place])    Hyperlipemia    Hypertension    Peyronie's disease 04/15/2019   Dr. Alyson Ingles urology   Wears dentures    top    Past Surgical History:  Procedure Laterality Date   CLOSED MANIPULATION SHOULDER WITH STERIOD INJECTION Right 01/17/2014   Procedure: RIGHT SHOULDER MANIPULATION WITH CORTISONE INJECTION;  Surgeon: Johnny Bridge, MD;  Location: [facility];  Service: Orthopedics;  Laterality: Right;   IR THORACENTESIS ASP PLEURAL SPACE W/IMG GUIDE  07/28/2021   TONSILLECTOMY      Allergies: Penicillins  Medications:  Current Facility-Administered Medications:    0.9 %  sodium chloride infusion, , Intravenous, Continuous, [place] Donnamarie Poag, DO, Last Rate: 200 mL/hr at 07/13/2021 1031, New Bag at 07/18/2021 1031   amLODipine (NORVASC) tablet 10 mg, 10 mg, Oral, Daily, [place] Donnamarie Poag,  DO, 10 mg at 07/28/2021 0754   calcitonin (MIACALCIN) injection 272 Units, 4 Units/kg, Subcutaneous, BID, Wynetta Fines T, MD, 272 Units at 07/28/21 2228   dexamethasone (DECADRON) injection 4 mg, 4 mg, Intravenous, Q6H, Wynetta Fines T, MD, 4 mg at 07/11/2021 0500   enoxaparin (LOVENOX) injection 40 mg, 40 mg, Subcutaneous, Daily, [place] Donnamarie Poag, DO, 40 mg at 07/28/21 1054   hydrALAZINE (APRESOLINE) tablet 25 mg, 25 mg, Oral, Q6H PRN, Wynetta Fines T, MD, 25 mg at 07/12/2021 0755   HYDROmorphone (DILAUDID) injection 0.5 mg, 0.5 mg, Intravenous, Q2H PRN, Wynetta Fines T, MD, 0.5 mg at 07/21/2021 9381   insulin aspart (novoLOG) injection 0-20 Units, 0-20 Units, Subcutaneous, TID WC, [place] Eric J, DO, 15 Units at 07/19/2021 0805   insulin aspart (novoLOG) injection 0-5 Units, 0-5 Units, Subcutaneous, QHS, [place] Eric J, DO   insulin glargine-yfgn [facility]) injection 25 Units, 25 Units, Subcutaneous, Daily, [place] Donnamarie Poag, DO, 25 Units at 07/24/2021 0804   lidocaine (PF) (XYLOCAINE) 1 % injection, , , PRN, Candiss Norse A, PA-C, 10 mL at 07/28/21 1003   nicotine (NICODERM CQ - dosed in mg/24 hours) patch 21 mg, 21 mg, Transdermal, Daily, [place], Ping T, MD, 21 mg at 08/04/2021 0758   ondansetron (ZOFRAN) tablet 4 mg, 4 mg, Oral, Q6H PRN **OR** ondansetron (ZOFRAN) injection 4 mg, 4 mg, Intravenous, Q6H PRN, Wynetta Fines T, MD, 4 mg at 07/28/21 1104   oxyCODONE (Oxy IR/ROXICODONE) immediate release tablet 5 mg, 5 mg, Oral, Q4H PRN, [place] Donnamarie Poag, DO  sodium zirconium cyclosilicate (LOKELMA) packet 10 g, 10 g, Oral, BID, [place] Donnamarie Poag, DO, 10 g at 07/22/2021 5956    No family history on file.  Social History   Socioeconomic History   Marital status: Married    Spouse name: Not on file   Number of children: Not on file   Years of education: Not on file   Highest education level: Not on file  Occupational History   Not on file  Tobacco Use   Smoking status: Every Day    Packs/day: 1.00    Types:  Cigarettes   Smokeless tobacco: Never  Substance and Sexual Activity   Alcohol use: Yes    Comment: occ   Drug use: No   Sexual activity: Not on file  Other Topics Concern   Not on file  Social History Narrative   Not on file   Social Determinants of Health   Financial Resource Strain: Not on file  Food Insecurity: Not on file  Transportation Needs: Not on file  Physical Activity: Not on file  Stress: Not on file  Social Connections: Not on file    Review of Systems: A 12 point ROS discussed and pertinent positives are indicated in the HPI above.  All other systems are negative.  Review of Systems  Vital Signs: BP (!) 190/75 (BP Location: Left Arm)   Pulse 74   Temp 98.2 F (36.8 C) (Oral)   Resp 16   Ht 6' (1.829 m)   Wt 68 kg   SpO2 96%   BMI 20.34 kg/m   Physical Exam Constitutional:      General: He is not in acute distress.    Appearance: He is ill-appearing. He is not toxic-appearing.  HENT:     Mouth/Throat:     Mouth: Mucous membranes are moist.     Pharynx: Oropharynx is clear.  Cardiovascular:     Rate and Rhythm: Normal rate and regular rhythm.     Heart sounds: Normal heart sounds.  Pulmonary:     Effort: Pulmonary effort is normal. No respiratory distress.  Musculoskeletal:     Comments: Tender mid thoracic region. No palpable defects  Neurological:     General: No focal deficit present.     Mental Status: He is alert and oriented to person, place, and time.    Imaging: DG Chest 1 View  Result Date: 07/28/2021 CLINICAL DATA:  Post thoracentesis right side EXAM: CHEST  1 VIEW COMPARISON:  CT 07/07/2021 FINDINGS: Decreased right pleural effusion after thoracentesis. Unchanged right upper lung mass and consolidation. Left lung is clear. No pneumothorax. Multiple osseous metastatic lesions, better visualized on recent CT. IMPRESSION: Decreased right pleural effusion after thoracentesis. No evidence of pneumothorax. Unchanged right upper lung mass  and surrounding lung consolidation. Electronically Signed   By: Maurine Simmering M.D.   On: 07/28/2021 13:24   DG Chest 2 View  Result Date: 07/25/2021 CLINICAL DATA:  Dyspnea EXAM: CHEST - 2 VIEW COMPARISON:  Chest radiograph from one day prior. FINDINGS: Stable cardiomediastinal silhouette with normal heart size. No pneumothorax. Stable trace bilateral pleural effusions. Dense right upper lobe consolidation is stable. Clear left lung. IMPRESSION: 1. Stable dense right upper lobe consolidation compatible with pneumonia. Underlying obstructing right upper lung lesion not excluded. Continued follow-up chest radiographs recommended at a minimum to document resolution. Chest CT with IV contrast may be considered. 2. Stable trace bilateral pleural effusions. Electronically Signed   By: Ilona Sorrel M.D.   On: 07/17/2021  10:56   DG Chest 2 View  Result Date: 07/26/2021 CLINICAL DATA:  Cough EXAM: CHEST - 2 VIEW COMPARISON:  Report 05/13/2011 FINDINGS: Small bilateral pleural effusions. Airspace disease in the right upper lobe. Normal cardiac size. No pneumothorax. IMPRESSION: 1. Airspace disease in the right upper lobe could be due to pneumonia but pulmonary mass or central lesion is also possible. Consider correlation with contrast-enhanced chest CT 2. Small bilateral effusions Electronically Signed   By: Donavan Foil M.D.   On: 07/26/2021 17:18   DG Lumbar Spine Complete  Result Date: 07/26/2021 CLINICAL DATA:  Provided history: Low back pain. Additional history provided: Cough since February, low back pain, bilateral hip pain. EXAM: LUMBAR SPINE - COMPLETE 4+ VIEW COMPARISON:  No pertinent prior exams available for comparison. FINDINGS: Five lumbar vertebrae. The caudal most well-formed intervertebral disc space is designated L5-S1. Minimal grade 1 retrolisthesis at T12-L1, L1-L2, L2-L3, L3-L4 and L5-S1. No lumbar vertebral compression fracture. No more than mild disc space narrowing within the lumbar spine.  Facet arthrosis, greatest at L4-L5 and L5-S1. Large stool burden within the colon and rectum. Aortic atherosclerosis. IMPRESSION: No lumbar vertebral compression fracture. Minimal grade 1 retrolisthesis at T12-L1, L1-L2, L2-L3, L3-L4 and L5-S1. Lumbar spondylosis, as described. Large stool burden within the colon and rectum, suggesting constipation. Aortic Atherosclerosis (ICD10-I70.0). Electronically Signed   By: Kellie Simmering D.O.   On: 07/26/2021 17:18   CT PELVIS WO CONTRAST  Result Date: 07/28/2021 CLINICAL DATA:  Pelvic fracture. EXAM: CT PELVIS WITHOUT CONTRAST TECHNIQUE: Multidetector CT imaging of the pelvis was performed following the standard protocol without intravenous contrast. RADIATION DOSE REDUCTION: This exam was performed according to the departmental dose-optimization program which includes automated exposure control, adjustment of the mA and/or kV according to patient size and/or use of iterative reconstruction technique. COMPARISON:  Jul 27, 2021. FINDINGS: Urinary Tract: No definite abnormality seen involving the urinary bladder. Bowel:  Unremarkable visualized pelvic bowel loops. Vascular/Lymphatic: No pathologically enlarged lymph nodes. No significant vascular abnormality seen. Reproductive:  Prostate gland is unremarkable. Other:  No definite evidence of hernia or ascites is noted. Musculoskeletal: There is again noted lytic destruction involving the left femoral head and neck anteriorly consistent with metastatic disease. Sclerotic density is seen involving the posterior portion of the left acetabulum as well as iliac bone adjacent to the left sacroiliac joint consistent with metastatic disease. Sclerotic densities are also noted in the visualized lumbar vertebral bodies and sacrum concerning for metastatic disease. IMPRESSION: Focal lytic destruction involving the left femoral neck and head is again noted consistent with metastatic disease. Sclerotic densities are again noted  involving the left acetabulum and iliac bone, as well as visualized lumbar vertebral bodies and sacrum concerning for metastatic disease. Electronically Signed   By: Marijo Conception M.D.   On: 07/28/2021 09:33   MR THORACIC SPINE WO CONTRAST  Result Date: 08/03/2021 CLINICAL DATA:  Compression fracture EXAM: MRI THORACIC SPINE WITHOUT CONTRAST TECHNIQUE: Multiplanar, multisequence MR imaging of the thoracic spine was performed. No intravenous contrast was administered. COMPARISON:  No prior MRI, correlation is made with CT chest abdomen pelvis 07/08/2021 FINDINGS: Alignment:  Physiologic. Vertebrae: Abnormal marrow signal is seen at every level, with decreased T1 and increased T2 signal throughout the T1, T3, T7, T10, T12, and L1 vertebral bodies, with the remaining thoracic spine demonstrating more focal lesions than diffuse involvement. 50% vertebral body height loss in T5, with bowing of the posterior cortex, most likely an acute to subacute pathologic fracture.  15% vertebral body height loss in T7, without definite bowing of the posterior cortex, which may also represent a pathologic fracture of indeterminate acuity. Lesser vertebral body height loss at T8, T9, and T10 does not appear acute. Multifocal involvement of the posterior elements. Abnormal signal is also noted in the imaged portions of the proximal left second sixth, eighth, tenth, and twelfth ribs and proximal right seventh, eighth and tenth ribs. Cord:  Normal signal and morphology. Paraspinal and other soft tissues: Mass involving the majority of the right upper lobe is better evaluated on the 07/28/2021 CT chest abdomen pelvis. Small pleural effusions, decreased on the right and stable to slightly increased on the left. T2 hyperintense lesions in the liver, which could represent metastatic disease but could also be cystic. Disc levels: No spinal canal stenosis. The bowing of the posterior cortex of T5 narrows the spinal canal somewhat but does  not cause significant stenosis. No significant neural foraminal narrowing. IMPRESSION: 1. 50% vertebral body height loss at T5, with bowing of the posterior cortex, most likely an acute to subacute pathologic fracture. 2. 15% vertebral body height loss at T7, which may represent a pathologic fracture of indeterminate acuity. 3. Diffusely abnormal marrow signal, concerning for metastatic disease. 4. Right lung mass and hepatic lesions are better evaluated on the prior CT. 5. Interval decrease in the size of the previously noted right pleural effusion, with stable or slightly increased in size left pleural effusion. Electronically Signed   By: Merilyn Baba M.D.   On: 07/15/2021 01:04   MR FEMUR LEFT WO CONTRAST  Result Date: 07/28/2021 CLINICAL DATA:  Metastatic disease evaluation EXAM: MR OF THE LEFT FEMUR WITHOUT CONTRAST TECHNIQUE: Multiplanar, multisequence MR imaging of the left femur was performed. No intravenous contrast was administered. COMPARISON:  Pelvis CT 07/28/2021. FINDINGS: Bones/Joint/Cartilage There are T2 hyperintense/T1 hypointense metastases within the proximal femurs bilaterally, including in the femoral heads, necks, intertrochanteric, and subtrochanteric regions. There is a lytic destructive lesion of the left femoral neck which involves up to 40% of the femoral neck width, at high risk for pathologic fracture. There is a large metastasis in the proximal right subtrochanteric femur which is also likely high risk for pathologic fracture, partially imaged only in the coronal plane but involving a significant portion of the subtrochanteric femur width. Partially visualized osseous metastatic lesions throughout the pelvis, most confluent in the left posterior acetabulum and ischial tuberosity. Muscles and Tendons There is feathery intramuscular edema within the hip adductors bilaterally, likely reactive. There is inter fascial edema throughout the left thigh without focal collection. Soft  tissues Subcutaneous edema of the left thigh. There is trace nonspecific free fluid in the pelvis, partially visualized. IMPRESSION: Multiple osseous metastases within the proximal femurs bilaterally, most concerning in the left femoral neck and right subtrochanteric femur, both high risk for pathologic fracture. Partially visualized osseous metastatic disease throughout the pelvis, most confluent in the left posterior acetabulum. Electronically Signed   By: Maurine Simmering M.D.   On: 07/28/2021 13:22   DG HIPS BILAT WITH PELVIS MIN 5 VIEWS  Result Date: 07/26/2021 CLINICAL DATA:  Hip pain EXAM: DG HIP (WITH OR WITHOUT PELVIS) 5+V BILAT COMPARISON:  None Available. FINDINGS: No fracture or malalignment. SI joints are patent. Pubic symphysis is intact. There are mild degenerative changes of both hips. IMPRESSION: Mild degenerative changes.  No acute osseous abnormality Electronically Signed   By: Donavan Foil M.D.   On: 07/26/2021 17:17   CT CHEST ABDOMEN PELVIS WO  CONTRAST  Result Date: 07/31/2021 CLINICAL DATA:  lung mass, weight loss, lumbar/hip pain EXAM: CT CHEST, ABDOMEN AND PELVIS WITHOUT CONTRAST TECHNIQUE: Multidetector CT imaging of the chest, abdomen and pelvis was performed following the standard protocol without IV contrast. RADIATION DOSE REDUCTION: This exam was performed according to the departmental dose-optimization program which includes automated exposure control, adjustment of the mA and/or kV according to patient size and/or use of iterative reconstruction technique. COMPARISON:  None Available. FINDINGS: Evaluation is limited with lack of IV contrast. CT CHEST FINDINGS Cardiovascular: Heart is the upper limits of normal in size. Mild LEFT-sided coronary artery atherosclerotic calcifications. Small pericardial effusion. Aorta is normal in course and caliber. Scattered atherosclerotic calcifications. Mediastinum/Nodes: Visualized thyroid is unremarkable. Mildly prominent LEFT subpectoral  axillary lymph node measures 9 mm in the short axis (series 4, image 51). There is mediastinal adenopathy. Representative rounded precarinal lymph node measures 11 mm (series 4, image 74). Evaluation for hilar adenopathy is limited secondary to lack of IV contrast. Ill-defined soft tissue encases the RIGHT mainstem bronchus and extends inferior to the carina and along the course of the trachea. This ill-defined soft tissue is seen within the AP window. Lungs/Pleura: Moderate RIGHT pleural effusion. Trace LEFT pleural effusion. Irregular consolidative opacity throughout the majority of the RIGHT upper lobe with interlobular septal thickening. Masslike consolidation centrally measures approximately 7.5 x 6.4 cm (series 6, image 61). It demonstrates mass effect on the RIGHT upper lobe bronchi and occludes them superiorly. Irregular soft tissue extends into the RIGHT middle and lower lobe with ill-defined soft tissue seen coursing along the RIGHT mainstem bronchus. Patchy LEFT basilar irregular centrilobular nodular opacities, nonspecific. Musculoskeletal: Infiltrative osseous metastatic disease is favored to involve every thoracic vertebral body and many of the visualized cervical vertebral bodies. Pathologic compression fracture deformity of T5. Lesions involve multiple bilateral ribs with scattered the pathologic fractures noted including the LEFT-sided fifth rib and RIGHT-sided eleventh rib. There are 13 thoracic type vertebral bodies. CT ABDOMEN PELVIS FINDINGS Hepatobiliary: There are multiple incompletely characterized hypoechoic masses with representative incompletely characterized hypoechoic mass of the RIGHT liver (series 3, image 75). These are incompletely characterized given lack of IV contrast. Gallbladder is unremarkable. Pancreas: No peripancreatic fat stranding. Spleen: Unremarkable. Adrenals/Urinary Tract: Fullness of the LEFT adrenal gland. No hydronephrosis. No obstructing nephrolithiasis. Bladder is  unremarkable. Stomach/Bowel: No evidence of bowel obstruction. Moderate colonic stool burden predominately within the RIGHT hemicolon. Appendix is normal. Vascular/Lymphatic: Atherosclerotic calcifications of the nonaneurysmal abdominal aorta. No definitive intra-abdominal adenopathy is identified in the limitations of this noncontrast exam. Reproductive: Prostate is present Other: No free air.  No free fluid. Musculoskeletal: Soft tissue mass involving the LEFT femoral neck and eroding the cortex anteriorly (series 4, image 310). It places patient at the risk for pathologic fracture. Infiltrative metastatic disease involving the LEFT acetabulum, pelvis, sacrum and all of the lumbar vertebral bodies. IMPRESSION: 1. Mass involving the majority of the RIGHT upper lobe with soft tissue extending along the RIGHT upper lobe and RIGHT mainstem bronchus, along the carina and into the AP window. This is concerning for primary lung malignancy. 2. Widespread osseous metastatic disease. Of note there is an osseous lesion involving the LEFT femoral head and neck cortex which places the patient at risk for impending pathologic fracture. Recommend consultation with Orthopedic surgery. 3. Moderate RIGHT pleural effusion, possibly malignant. 4. Multiple hypoechoic masses throughout the liver, incompletely assessed given lack of IV contrast. These are concerning for hepatic metastatic disease. 5. Fullness of the  LEFT adrenal gland, likely adrenal metastatic disease. 6. There may be a postobstructive infectious or atelectatic component within the RIGHT upper lobe. Lymphangitic spread remains a differential consideration. 7. Scattered LEFT basilar centrilobular nodular opacities are nonspecific and could be infectious or inflammatory in etiology versus reflect additional metastatic disease. 8. Pathologic compression fracture of T5. These results were called by telephone at the time of interpretation on 07/12/2021 at 2:26 pm to provider  MELANIE BELFI , who verbally acknowledged these results. Electronically Signed   By: Valentino Saxon M.D.   On: 07/26/2021 14:39   IR THORACENTESIS ASP PLEURAL SPACE W/IMG GUIDE  Result Date: 07/28/2021 INDICATION: Patient was recently diagnosed right upper lobe lung mass, right pleural effusion. Request IR for diagnostic and therapeutic right thoracentesis EXAM: ULTRASOUND GUIDED RIGHT THORACENTESIS MEDICATIONS: 6 mL 1% lidocaine COMPLICATIONS: None immediate. PROCEDURE: An ultrasound guided thoracentesis was thoroughly discussed with the patient and questions answered. The benefits, risks, alternatives and complications were also discussed. The patient understands and wishes to proceed with the procedure. Written consent was obtained. Ultrasound was performed to localize and mark an adequate pocket of fluid in the right chest. The area was then prepped and draped in the normal sterile fashion. 1% Lidocaine was used for local anesthesia. Under ultrasound guidance a 6 Fr Safe-T-Centesis catheter was introduced. Thoracentesis was performed. The catheter was removed and a dressing applied. FINDINGS: A total of approximately 750 mL of hazy yellow fluid was removed. Samples were sent to the laboratory as requested by the clinical team. IMPRESSION: Successful ultrasound guided right thoracentesis yielding 750 mL of pleural fluid. Read by Candiss Norse, PA-C Electronically Signed   By: Aletta Edouard M.D.   On: 07/28/2021 12:20    Labs:  CBC: Recent Labs    07/26/21 1632 07/31/2021 1021 07/28/21 0316 07/06/2021 0214  WBC 26.1* 20.1* 20.2* 18.9*  HGB 12.9* 10.9* 10.7* 10.4*  HCT 39.2 33.6* 33.3* 31.2*  PLT 363 269 279 248    COAGS: No results for input(s): INR, APTT in the last 8760 hours.  BMP: Recent Labs    07/26/21 1632 07/15/2021 1021 07/28/21 0316 07/21/2021 0214  NA 134 132* 133* 132*  K 5.0 5.3* 5.3* 5.2*  CL 96 99 103 104  CO2 25 25 23 22   GLUCOSE 144* 318* 282* 223*  BUN 31*  38* 44* 52*  CALCIUM 14.9* 14.0* 13.6* 12.7*  CREATININE 1.87* 2.28* 2.35* 2.39*  GFRNONAA  --  33* 31* 31*    LIVER FUNCTION TESTS: Recent Labs    08/10/20 0907 07/26/21 1632 07/19/2021 1021  BILITOT 0.3 0.3 0.8  AST 12 46* 32  ALT 14 57* 38  ALKPHOS 76 866* 570*  PROT 6.8 7.0 5.8*  ALBUMIN 4.1 3.5* 2.4*    TUMOR MARKERS: No results for input(s): AFPTM, CEA, CA199, CHROMGRNA in the last 8760 hours.  Assessment and Plan: Pathologic fx of T5 and T7. After review of imaging with Dr. Estanislado Pandy, feel pt is candidate for ablation with vertebral augmentation. Can perform biopsy at time of procedure. Discussed this with pt and family, who are in favor of procedure if it will help his pain. Explained role for ablation as can treat the tumor as well as stabilize the fracture, thus increasing chance of significant symptomatic improvement. Will see how he does from hip surgery. Have submitted for insurance authorization. Will coordinate with anesthesia once it is known when we are able to schedule. Risks and benefits of co-ablation/kyphoplasty of thoracic vertebral fractures were discussed with  the patient including, but not limited to education regarding the natural healing process of compression fractures without intervention, bleeding, infection, cement migration which may cause spinal cord damage, paralysis, pulmonary embolism or even death.  This interventional procedure involves the use of X-rays and because of the nature of the planned procedure, it is possible that we will have prolonged use of X-ray fluoroscopy.  Potential radiation risks to you include (but are not limited to) the following: - A slightly elevated risk for cancer  several years later in life. This risk is typically less than 0.5% percent. This risk is low in comparison to the normal incidence of human cancer, which is 33% for women and 50% for men according to the [place]. - Radiation induced  injury can include skin redness, resembling a rash, tissue breakdown / ulcers and hair loss (which can be temporary or permanent).   The likelihood of either of these occurring depends on the difficulty of the procedure and whether you are sensitive to radiation due to previous procedures, disease, or genetic conditions.   IF your procedure requires a prolonged use of radiation, you will be notified and given written instructions for further action.  It is your responsibility to monitor the irradiated area for the 2 weeks following the procedure and to notify your physician if you are concerned that you have suffered a radiation induced injury.    All of the patient's questions were answered, patient is agreeable to proceed.    Thank you for this interesting consult.  I greatly enjoyed meeting Michael Fritz and look forward to participating in their care.  A copy of this report was sent to the requesting provider on this date.  Electronically Signed: Ascencion Dike, PA-C 07/16/2021, 11:57 AM   I spent a total of 20 minutes in face to face in clinical consultation, greater than 50% of which was counseling/coordinating care for T5 and T7 pathologic fractures

## 2021-07-29 NOTE — Progress Notes (Signed)
Inpatient Diabetes Program Recommendations  AACE/ADA: New Consensus Statement on Inpatient Glycemic Control (2015)  Target Ranges:  Prepandial:   less than 140 mg/dL      Peak postprandial:   less than 180 mg/dL (1-2 hours)      Critically ill patients:  140 - 180 mg/dL   Lab Results  Component Value Date   GLUCAP 345 (H) 07/08/2021   HGBA1C 12.2 (H) 07/26/2021    Review of Glycemic Control  Latest Reference Range & Units 07/28/21 07:58 07/28/21 13:25 07/28/21 16:33 07/28/21 19:36 07/31/2021 07:38  Glucose-Capillary 70 - 99 mg/dL 348 (H) 312 (H) 199 (H) 132 (H) 345 (H)  (H): Data is abnormally high  Diabetes history: DM2 Outpatient Diabetes medications: Lantus 50-55 units QD Current orders for Inpatient glycemic control: Semglee 25 units QD, Novolog 0-20 units TID and 0-5 units QHS, decadron 4 mg Q6H  Inpatient Diabetes Program Recommendations:    Semglee 22 units BID  Will continue to follow while inpatient.  Thank you, Reche Dixon, MSN, [facility] Coordinator Inpatient Diabetes Program [phone] (team pager from 8a-5p)

## 2021-07-29 NOTE — Progress Notes (Addendum)
PROGRESS NOTE    Michael Fritz  TXM:[id number] DOB: [birth year] DOA: 07/08/2021 PCP: Kathyrn Drown, MD    Brief Narrative:   Michael Fritz is a 56 y.o. male with past medical history significant for essential hypertension, insulin-dependent diabetes mellitus, hyperlipidemia, tobacco abuse disorder who presented to [facility] ED by direction of his primary care office for abnormal labs.  Patient reports generalized weakness, unintentional weight loss of 12 pounds over the last several months.  Also complaining of left-sided hip pain that is worse with ambulation.  Patient denies any chest pain, no abdominal pain, no headache, no visual changes.  Day prior, patient went to see his PCP ordered blood work that showed a calcium of 12.9 and was told to come to the ED for further evaluation.  In the ED, temperature 97.7 F, HR 63, RR 17, BP 120/53, SPO2 90% on room air.  WBC 20.1, hemoglobin 10.9, platelets 269.  Sodium 132, potassium 5.3, chloride 99, CO2 25, glucose 318, BUN 28, creatinine 2.28, calcium 14.0, AST 32, ALT 38, total bilirubin 0.8.  Urinalysis unrevealing.  Chest x-ray with stable dense right upper lobe consolidation compatible with pneumonia versus mass.  CT chest/abdomen/pelvis without contrast with mass involving majority of right upper lobe with soft tissue extending along the right upper lobe and right mainstem bronchus along the carina into the AP window concerning for primary lung malignancy, widespread osseous metastatic disease with osseous lesion involving left femoral head and neck cortex which concerning for impending pathologic fracture, moderate right pleural effusion, multiple hypoechoic masses throughout the liver, fullness left adrenal gland concerning for metastases, pathologic compression fracture T5.  Hospitalist service consulted for admission for likely primary lung cancer with metastatic disease, hypercalcemia of malignancy, acute renal failure.  Assessment & Plan:    Right upper lobe mass concerning for primary lung malignancy with metastasis to liver, left adrenal, bone CT chest/abdomen/pelvis without contrast with mass involving majority of right upper lobe with soft tissue extending along the right upper lobe and right mainstem bronchus along the carina into the AP window concerning for primary lung malignancy, widespread osseous metastatic disease with osseous lesion involving left femoral head and neck cortex which concerning for impending pathologic fracture, moderate right pleural effusion, multiple hypoechoic masses throughout the liver, fullness left adrenal gland concerning for metastases, pathologic compression fracture T5. --Oncology consulted, Dr. Alen Blew --S/p thoracentesis 5/24, cytology pending --Orthopedics to send resected femur bone for pathology as well today  Impending pathologic fracture left/right femur CT pelvis without contrast with focal lytic destruction involving the left femoral neck and head consistent with metastatic disease.  MR left femur with multiple osseous metastases within the proximal femurs bilaterally most concerning on the left femoral neck and right subtrochanteric femur both high risk for pathologic fracture. --Nonweightbearing left lower extremity --Orthopedics following, Dr. Lyla Glassing plans total hip arthroplasty on 5/25 --N.p.o.  -- We will need radiation  Pathologic T5/T7 compression fracture --IR following for T5 and T7 biopsy/ablation/kyphoplasty, pending insurance authorization  Hypercalcemia of malignancy Calcium 14.0 on arrival, etiology likely secondary to diffuse malignancy from likely primary lung cancer. --Ca 14.0>13.6>12.7 --iPTH: pending --PTHrP: pending --s/p pamidronate on 5/24 --NS at 200 mL/h --Calcitonin BID --Decadron 4mg  IV q6h  Acute renal failure Creatinine 2.28 on admission.  Etiology likely secondary to hypercalcemia.  Uric acid level within normal limits.  No urine obstruction noted  on CT abdomen/pelvis. --Holding home metformin/ARB --Continue IV fluid hydration as above --Avoid nephrotoxins, renally dose all medications --BMP daily  Hyperkalemia Potassium 5.2 this morning, likely secondary to acute renal failure. --Lokelma 10 g p.o. twice daily today --repeat BMP in a.m.  Hypomagnesemia Magnesium 1.6, will replete. --Repeat magnesium level in a.m.  Pleural effusion, likely malignant Underwent thoracentesis on 07/28/2021 by IR with 750 mL of fluid removed. --Cytology: Pending  Hyponatremia Sodium 132 on arrival.  Suspect etiology related to lung cancer with SIADH. --Na [id number] --Continue IV fluid hydration as above --BMP daily  Leukocytosis Patient is afebrile, WBC count elevated 20.1.  Denies productive cough.  Low suspicion for infectious etiology and holding antibiotics for now. --CBC daily  Type 2 diabetes with hyperglycemia Hemoglobin A1c 12.2 on 07/26/2021, poorly controlled.  Likely complicated by current steroid use for hypercalcemia of malignancy as above.  Home medications include Lantus 55-55 units daily, metformin 500 mg twice daily. --Diabetic educator following, appreciate assistance --Semglee 22u Boot[place] BID --Resistant SSI for coverage --CBGs qAC/HS  Essential hypertension Home medication includes losartan 160 mg p.o. daily. --Holding ARB in the setting of AKI as above --Start amlodipine 5 mg p.o. daily --Hydralazine 25 mg PO q6h PRN SBP >150 or DBP >100 --Continue monitor BP closely  Hyperlipidemia Currently not taking statin at home. --Outpatient follow-up PCP  Tobacco use disorder Counseled on need for complete cessation given new diagnosis of likely metastatic lung cancer. --Nicotine patch  Severe protein calorie malnutrition Nutrition Status: Nutrition Problem: Severe Malnutrition (in the context of chronic illness) Etiology: decreased appetite Signs/Symptoms: severe muscle depletion, severe fat depletion Interventions:  Refer to RD note for recommendations --Continue encourage increased oral intake, supplementation   DVT prophylaxis: enoxaparin (LOVENOX) injection 40 mg Start: 07/28/21 1030 Place and maintain sequential compression device Start: 07/28/2021 1646    Code Status: Full Code Family Communication:   Disposition Plan:  Level of care: Med-Surg Status is: Inpatient Remains inpatient appropriate because: We will transfer to [facility] long hospital for operative management of his impending left hip fracture this afternoon and will return to [facility] thereafter.  Waiting insurance authorization for T5/7 kyphoplasty/ablation by IR.  Will likely need radiation to his metastatic disease sites and hips.  Waiting for cytology to return from thoracentesis.    Consultants:  Oncology, Dr. Alen Blew Orthopedics, Dr. Lyla Glassing Interventional radiology  Procedures:  Thoracentesis 5/24  Antimicrobials:  None   Subjective: Patient seen examined bedside, resting comfortably.  Continues with back pain.  Awaiting surgical intervention for impending left hip fracture later today at [facility] long.  Family members present and updated in room.  Discussed case with Dr. Lyla Glassing earlier this morning.  No other complaints or concerns at this time.  Denies headache, no visual changes, no chest pain, no palpitations, no shortness of breath, no abdominal pain, no fever/chills/night sweats, no nausea/vomiting/diarrhea, no congestion, no fatigue, no focal weakness, no paresthesias.  No acute events overnight per nursing staff.  Objective: Vitals:   07/28/21 1935 07/20/2021 0447 07/16/2021 0700 07/11/2021 1156  BP: (!) 142/60 (!) 181/64 (!) 190/75 (!) 176/61  Pulse: 64 77 74 75  Resp: 17 17 16 16   Temp: 98 F (36.7 C) 98.6 F (37 C) 98.2 F (36.8 C) 98.2 F (36.8 C)  TempSrc: Oral Oral Oral Oral  SpO2: 96% 94% 96% 96%  Weight:      Height:        Intake/Output Summary (Last 24 hours) at 07/14/2021 1211 Last data filed at  07/12/2021 0739 Gross per 24 hour  Intake 0 ml  Output 450 ml  Net -450 ml   Filed  Weights   07/23/2021 1032  Weight: 68 kg    Examination:  Physical Exam: GEN: NAD, alert and oriented x 3, wd/wn HEENT: NCAT, PERRL, EOMI, sclera clear, MMM PULM: CTAB w/o wheezes/crackles, normal respiratory effort, on room air CV: RRR w/o M/G/R GI: abd soft, NTND, NABS, no R/G/M MSK: no peripheral edema, muscle strength globally intact 5/5 bilateral upper/lower extremities NEURO: CN II-XII intact, no focal deficits, sensation to light touch intact PSYCH: normal mood/affect Integumentary: dry/intact, no rashes or wounds    Data Reviewed: I have personally reviewed following labs and imaging studies  CBC: Recent Labs  Lab 07/26/21 1632 07/23/2021 1021 07/28/21 0316 07/19/2021 0214  WBC 26.1* 20.1* 20.2* 18.9*  NEUTROABS  --  16.8*  --   --   HGB 12.9* 10.9* 10.7* 10.4*  HCT 39.2 33.6* 33.3* 31.2*  MCV 88 90.8 89.0 88.6  PLT 363 269 279 884   Basic Metabolic Panel: Recent Labs  Lab 07/26/21 1632 07/26/2021 1021 07/28/21 0316 07/09/2021 0214  NA 134 132* 133* 132*  K 5.0 5.3* 5.3* 5.2*  CL 96 99 103 104  CO2 25 25 23 22   GLUCOSE 144* 318* 282* 223*  BUN 31* 38* 44* 52*  CREATININE 1.87* 2.28* 2.35* 2.39*  CALCIUM 14.9* 14.0* 13.6* 12.7*  MG  --   --   --  1.6*   GFR: Estimated Creatinine Clearance: 32.8 mL/min (A) (by C-G formula based on SCr of 2.39 mg/dL (H)). Liver Function Tests: Recent Labs  Lab 07/26/21 1632 07/11/2021 1021  AST 46* 32  ALT 57* 38  ALKPHOS 866* 570*  BILITOT 0.3 0.8  PROT 7.0 5.8*  ALBUMIN 3.5* 2.4*   No results for input(s): LIPASE, AMYLASE in the last 168 hours. No results for input(s): AMMONIA in the last 168 hours. Coagulation Profile: No results for input(s): INR, PROTIME in the last 168 hours. Cardiac Enzymes: Recent Labs  Lab 07/31/2021 1952  CKTOTAL 29*   BNP (last 3 results) No results for input(s): PROBNP in the last 8760  hours. HbA1C: Recent Labs    07/26/21 1632  HGBA1C 12.2*   CBG: Recent Labs  Lab 07/28/21 1325 07/28/21 1633 07/28/21 1936 07/31/2021 0738 07/06/2021 1153  GLUCAP 312* 199* 132* 345* 287*   Lipid Profile: Recent Labs    07/26/21 1632  CHOL 150  HDL 63  LDLCALC 68  TRIG 104  CHOLHDL 2.4   Thyroid Function Tests: No results for input(s): TSH, T4TOTAL, FREET4, T3FREE, THYROIDAB in the last 72 hours. Anemia Panel: No results for input(s): VITAMINB12, FOLATE, FERRITIN, TIBC, IRON, RETICCTPCT in the last 72 hours. Sepsis Labs: No results for input(s): PROCALCITON, LATICACIDVEN in the last 168 hours.  No results found for this or any previous visit (from the past 240 hour(s)).       Radiology Studies: DG Chest 1 View  Result Date: 07/28/2021 CLINICAL DATA:  Post thoracentesis right side EXAM: CHEST  1 VIEW COMPARISON:  CT 07/23/2021 FINDINGS: Decreased right pleural effusion after thoracentesis. Unchanged right upper lung mass and consolidation. Left lung is clear. No pneumothorax. Multiple osseous metastatic lesions, better visualized on recent CT. IMPRESSION: Decreased right pleural effusion after thoracentesis. No evidence of pneumothorax. Unchanged right upper lung mass and surrounding lung consolidation. Electronically Signed   By: Maurine Simmering M.D.   On: 07/28/2021 13:24   CT PELVIS WO CONTRAST  Result Date: 07/28/2021 CLINICAL DATA:  Pelvic fracture. EXAM: CT PELVIS WITHOUT CONTRAST TECHNIQUE: Multidetector CT imaging of the pelvis was performed following  the standard protocol without intravenous contrast. RADIATION DOSE REDUCTION: This exam was performed according to the departmental dose-optimization program which includes automated exposure control, adjustment of the mA and/or kV according to patient size and/or use of iterative reconstruction technique. COMPARISON:  Jul 27, 2021. FINDINGS: Urinary Tract: No definite abnormality seen involving the urinary bladder. Bowel:   Unremarkable visualized pelvic bowel loops. Vascular/Lymphatic: No pathologically enlarged lymph nodes. No significant vascular abnormality seen. Reproductive:  Prostate gland is unremarkable. Other:  No definite evidence of hernia or ascites is noted. Musculoskeletal: There is again noted lytic destruction involving the left femoral head and neck anteriorly consistent with metastatic disease. Sclerotic density is seen involving the posterior portion of the left acetabulum as well as iliac bone adjacent to the left sacroiliac joint consistent with metastatic disease. Sclerotic densities are also noted in the visualized lumbar vertebral bodies and sacrum concerning for metastatic disease. IMPRESSION: Focal lytic destruction involving the left femoral neck and head is again noted consistent with metastatic disease. Sclerotic densities are again noted involving the left acetabulum and iliac bone, as well as visualized lumbar vertebral bodies and sacrum concerning for metastatic disease. Electronically Signed   By: Marijo Conception M.D.   On: 07/28/2021 09:33   MR THORACIC SPINE WO CONTRAST  Result Date: 07/15/2021 CLINICAL DATA:  Compression fracture EXAM: MRI THORACIC SPINE WITHOUT CONTRAST TECHNIQUE: Multiplanar, multisequence MR imaging of the thoracic spine was performed. No intravenous contrast was administered. COMPARISON:  No prior MRI, correlation is made with CT chest abdomen pelvis 07/24/2021 FINDINGS: Alignment:  Physiologic. Vertebrae: Abnormal marrow signal is seen at every level, with decreased T1 and increased T2 signal throughout the T1, T3, T7, T10, T12, and L1 vertebral bodies, with the remaining thoracic spine demonstrating more focal lesions than diffuse involvement. 50% vertebral body height loss in T5, with bowing of the posterior cortex, most likely an acute to subacute pathologic fracture. 15% vertebral body height loss in T7, without definite bowing of the posterior cortex, which may also  represent a pathologic fracture of indeterminate acuity. Lesser vertebral body height loss at T8, T9, and T10 does not appear acute. Multifocal involvement of the posterior elements. Abnormal signal is also noted in the imaged portions of the proximal left second sixth, eighth, tenth, and twelfth ribs and proximal right seventh, eighth and tenth ribs. Cord:  Normal signal and morphology. Paraspinal and other soft tissues: Mass involving the majority of the right upper lobe is better evaluated on the 07/25/2021 CT chest abdomen pelvis. Small pleural effusions, decreased on the right and stable to slightly increased on the left. T2 hyperintense lesions in the liver, which could represent metastatic disease but could also be cystic. Disc levels: No spinal canal stenosis. The bowing of the posterior cortex of T5 narrows the spinal canal somewhat but does not cause significant stenosis. No significant neural foraminal narrowing. IMPRESSION: 1. 50% vertebral body height loss at T5, with bowing of the posterior cortex, most likely an acute to subacute pathologic fracture. 2. 15% vertebral body height loss at T7, which may represent a pathologic fracture of indeterminate acuity. 3. Diffusely abnormal marrow signal, concerning for metastatic disease. 4. Right lung mass and hepatic lesions are better evaluated on the prior CT. 5. Interval decrease in the size of the previously noted right pleural effusion, with stable or slightly increased in size left pleural effusion. Electronically Signed   By: Merilyn Baba M.D.   On: 07/16/2021 01:04   MR FEMUR LEFT WO CONTRAST  Result Date: 07/28/2021 CLINICAL DATA:  Metastatic disease evaluation EXAM: MR OF THE LEFT FEMUR WITHOUT CONTRAST TECHNIQUE: Multiplanar, multisequence MR imaging of the left femur was performed. No intravenous contrast was administered. COMPARISON:  Pelvis CT 07/28/2021. FINDINGS: Bones/Joint/Cartilage There are T2 hyperintense/T1 hypointense metastases  within the proximal femurs bilaterally, including in the femoral heads, necks, intertrochanteric, and subtrochanteric regions. There is a lytic destructive lesion of the left femoral neck which involves up to 40% of the femoral neck width, at high risk for pathologic fracture. There is a large metastasis in the proximal right subtrochanteric femur which is also likely high risk for pathologic fracture, partially imaged only in the coronal plane but involving a significant portion of the subtrochanteric femur width. Partially visualized osseous metastatic lesions throughout the pelvis, most confluent in the left posterior acetabulum and ischial tuberosity. Muscles and Tendons There is feathery intramuscular edema within the hip adductors bilaterally, likely reactive. There is inter fascial edema throughout the left thigh without focal collection. Soft tissues Subcutaneous edema of the left thigh. There is trace nonspecific free fluid in the pelvis, partially visualized. IMPRESSION: Multiple osseous metastases within the proximal femurs bilaterally, most concerning in the left femoral neck and right subtrochanteric femur, both high risk for pathologic fracture. Partially visualized osseous metastatic disease throughout the pelvis, most confluent in the left posterior acetabulum. Electronically Signed   By: Maurine Simmering M.D.   On: 07/28/2021 13:22   CT CHEST ABDOMEN PELVIS WO CONTRAST  Result Date: 07/22/2021 CLINICAL DATA:  lung mass, weight loss, lumbar/hip pain EXAM: CT CHEST, ABDOMEN AND PELVIS WITHOUT CONTRAST TECHNIQUE: Multidetector CT imaging of the chest, abdomen and pelvis was performed following the standard protocol without IV contrast. RADIATION DOSE REDUCTION: This exam was performed according to the departmental dose-optimization program which includes automated exposure control, adjustment of the mA and/or kV according to patient size and/or use of iterative reconstruction technique. COMPARISON:   None Available. FINDINGS: Evaluation is limited with lack of IV contrast. CT CHEST FINDINGS Cardiovascular: Heart is the upper limits of normal in size. Mild LEFT-sided coronary artery atherosclerotic calcifications. Small pericardial effusion. Aorta is normal in course and caliber. Scattered atherosclerotic calcifications. Mediastinum/Nodes: Visualized thyroid is unremarkable. Mildly prominent LEFT subpectoral axillary lymph node measures 9 mm in the short axis (series 4, image 51). There is mediastinal adenopathy. Representative rounded precarinal lymph node measures 11 mm (series 4, image 74). Evaluation for hilar adenopathy is limited secondary to lack of IV contrast. Ill-defined soft tissue encases the RIGHT mainstem bronchus and extends inferior to the carina and along the course of the trachea. This ill-defined soft tissue is seen within the AP window. Lungs/Pleura: Moderate RIGHT pleural effusion. Trace LEFT pleural effusion. Irregular consolidative opacity throughout the majority of the RIGHT upper lobe with interlobular septal thickening. Masslike consolidation centrally measures approximately 7.5 x 6.4 cm (series 6, image 61). It demonstrates mass effect on the RIGHT upper lobe bronchi and occludes them superiorly. Irregular soft tissue extends into the RIGHT middle and lower lobe with ill-defined soft tissue seen coursing along the RIGHT mainstem bronchus. Patchy LEFT basilar irregular centrilobular nodular opacities, nonspecific. Musculoskeletal: Infiltrative osseous metastatic disease is favored to involve every thoracic vertebral body and many of the visualized cervical vertebral bodies. Pathologic compression fracture deformity of T5. Lesions involve multiple bilateral ribs with scattered the pathologic fractures noted including the LEFT-sided fifth rib and RIGHT-sided eleventh rib. There are 13 thoracic type vertebral bodies. CT ABDOMEN PELVIS FINDINGS Hepatobiliary: There are multiple incompletely  characterized hypoechoic masses with representative incompletely characterized hypoechoic mass of the RIGHT liver (series 3, image 75). These are incompletely characterized given lack of IV contrast. Gallbladder is unremarkable. Pancreas: No peripancreatic fat stranding. Spleen: Unremarkable. Adrenals/Urinary Tract: Fullness of the LEFT adrenal gland. No hydronephrosis. No obstructing nephrolithiasis. Bladder is unremarkable. Stomach/Bowel: No evidence of bowel obstruction. Moderate colonic stool burden predominately within the RIGHT hemicolon. Appendix is normal. Vascular/Lymphatic: Atherosclerotic calcifications of the nonaneurysmal abdominal aorta. No definitive intra-abdominal adenopathy is identified in the limitations of this noncontrast exam. Reproductive: Prostate is present Other: No free air.  No free fluid. Musculoskeletal: Soft tissue mass involving the LEFT femoral neck and eroding the cortex anteriorly (series 4, image 310). It places patient at the risk for pathologic fracture. Infiltrative metastatic disease involving the LEFT acetabulum, pelvis, sacrum and all of the lumbar vertebral bodies. IMPRESSION: 1. Mass involving the majority of the RIGHT upper lobe with soft tissue extending along the RIGHT upper lobe and RIGHT mainstem bronchus, along the carina and into the AP window. This is concerning for primary lung malignancy. 2. Widespread osseous metastatic disease. Of note there is an osseous lesion involving the LEFT femoral head and neck cortex which places the patient at risk for impending pathologic fracture. Recommend consultation with Orthopedic surgery. 3. Moderate RIGHT pleural effusion, possibly malignant. 4. Multiple hypoechoic masses throughout the liver, incompletely assessed given lack of IV contrast. These are concerning for hepatic metastatic disease. 5. Fullness of the LEFT adrenal gland, likely adrenal metastatic disease. 6. There may be a postobstructive infectious or atelectatic  component within the RIGHT upper lobe. Lymphangitic spread remains a differential consideration. 7. Scattered LEFT basilar centrilobular nodular opacities are nonspecific and could be infectious or inflammatory in etiology versus reflect additional metastatic disease. 8. Pathologic compression fracture of T5. These results were called by telephone at the time of interpretation on 07/05/2021 at 2:26 pm to provider MELANIE BELFI , who verbally acknowledged these results. Electronically Signed   By: Valentino Saxon M.D.   On: 07/14/2021 14:39   IR THORACENTESIS ASP PLEURAL SPACE W/IMG GUIDE  Result Date: 07/28/2021 INDICATION: Patient was recently diagnosed right upper lobe lung mass, right pleural effusion. Request IR for diagnostic and therapeutic right thoracentesis EXAM: ULTRASOUND GUIDED RIGHT THORACENTESIS MEDICATIONS: 6 mL 1% lidocaine COMPLICATIONS: None immediate. PROCEDURE: An ultrasound guided thoracentesis was thoroughly discussed with the patient and questions answered. The benefits, risks, alternatives and complications were also discussed. The patient understands and wishes to proceed with the procedure. Written consent was obtained. Ultrasound was performed to localize and mark an adequate pocket of fluid in the right chest. The area was then prepped and draped in the normal sterile fashion. 1% Lidocaine was used for local anesthesia. Under ultrasound guidance a 6 Fr Safe-T-Centesis catheter was introduced. Thoracentesis was performed. The catheter was removed and a dressing applied. FINDINGS: A total of approximately 750 mL of hazy yellow fluid was removed. Samples were sent to the laboratory as requested by the clinical team. IMPRESSION: Successful ultrasound guided right thoracentesis yielding 750 mL of pleural fluid. Read by Candiss Norse, PA-C Electronically Signed   By: Aletta Edouard M.D.   On: 07/28/2021 12:20        Scheduled Meds:  amLODipine  10 mg Oral Daily   calcitonin   4 Units/kg Subcutaneous BID   dexamethasone (DECADRON) injection  4 mg Intravenous Q6H   enoxaparin (LOVENOX) injection  40 mg Subcutaneous Daily   insulin aspart  0-20 Units Subcutaneous TID WC  insulin aspart  0-5 Units Subcutaneous QHS   insulin glargine-yfgn  22 Units Subcutaneous BID   nicotine  21 mg Transdermal Daily   sodium zirconium cyclosilicate  10 g Oral BID   Continuous Infusions:  sodium chloride 200 mL/hr at 07/23/2021 1031     LOS: 2 days    Time spent: 58 minutes spent on chart review, discussion with nursing staff, consultants, updating family and interview/physical exam; more than 50% of that time was spent in counseling and/or coordination of care.    Miosha Behe J [place] DO [facility] Hospitalists Available via Epic secure chat 7am-7pm After these hours, please refer to coverage provider listed on [email or web address] 07/16/2021, 12:11 PM

## 2021-07-29 NOTE — Anesthesia Preprocedure Evaluation (Addendum)
Anesthesia Evaluation  Patient identified by MRN, date of birth, ID band Patient awake    Reviewed: Allergy & Precautions, NPO status , Patient's Chart, lab work & pertinent test results  History of Anesthesia Complications Negative for: history of anesthetic complications  Airway Mallampati: II  TM Distance: >3 FB Neck ROM: Full    Dental  (+) Dental Advisory Given, Partial Lower, Upper Dentures   Pulmonary Current Smoker and Patient abstained from smoking.,   RUL consolidation, likely lung cancer with widespread bone and liver metastases Pleural effusions s/p right thoracocentesis with .75L removed     Pulmonary exam normal        Cardiovascular hypertension, Pt. on medications Normal cardiovascular exam     Neuro/Psych negative neurological ROS  negative psych ROS   GI/Hepatic negative GI ROS, Neg liver ROS,   Endo/Other  diabetes, Type 2, Insulin Dependent, Oral Hypoglycemic Agents Na 132, K 5.2, Ca 12.7   Renal/GU CRFRenal disease     Musculoskeletal  (+) Arthritis ,   Abdominal   Peds  Hematology  (+) Blood dyscrasia, anemia ,   Anesthesia Other Findings MRI spine - 1. 50% vertebral body height loss at T5, with bowing of the posterior cortex, most likely an acute to subacute pathologic fracture. 2. 15% vertebral body height loss at T7, which may represent a pathologic fracture of indeterminate acuity. 3. Diffusely abnormal marrow signal, concerning for metastatic disease. 4. Right lung mass and hepatic lesions are better evaluated on the prior CT. 5. Interval decrease in the size of the previously noted right pleural effusion, with stable or slightly increased in size left pleural effusion.   Reproductive/Obstetrics                           Anesthesia Physical Anesthesia Plan  ASA: 3  Anesthesia Plan: General   Post-op Pain Management: Tylenol PO (pre-op)* and Ketamine IV*    Induction: Intravenous  PONV Risk Score and Plan: 2 and Treatment may vary due to age or medical condition, Ondansetron, Dexamethasone and Midazolam  Airway Management Planned: Oral ETT  Additional Equipment: None  Intra-op Plan:   Post-operative Plan: Extubation in OR  Informed Consent: I have reviewed the patients History and Physical, chart, labs and discussed the procedure including the risks, benefits and alternatives for the proposed anesthesia with the patient or authorized representative who has indicated his/her understanding and acceptance.     Dental advisory given  Plan Discussed with: CRNA and Anesthesiologist  Anesthesia Plan Comments:        Anesthesia Quick Evaluation

## 2021-07-29 NOTE — Progress Notes (Signed)
Initial Nutrition Assessment  DOCUMENTATION CODES:  Severe malnutrition in context of chronic illness  INTERVENTION:  After surgery, allow a carb modified diet for maximum choices. Encourage PO intake Continue Ensure Enlive po BID, each supplement provides 350 kcal and 20 grams of protein. MVI with minerals daily  NUTRITION DIAGNOSIS:  Severe Malnutrition (in the context of chronic illness) related to decreased appetite as evidenced by severe muscle depletion, severe fat depletion.  GOAL:  Patient will meet greater than or equal to 90% of their needs  MONITOR:  PO intake, Supplement acceptance, Diet advancement, Labs, Weight trends  REASON FOR ASSESSMENT:  Malnutrition Screening Tool    ASSESSMENT:  Pt with hx of HTN, DM type 2, and HLD presented to ED with fatigue and abnormal blood work. Imaging in ED showed a right lung mass and multiple metastatic lesions in the abdomen and bones.  Orthopedics consulted for observed pathologic fracture of the left femoral neck. Planned for transfer to [facility] for prophylactic nailing 5/25. Will return to [facility] after procedure. Currently NPO for surgery.  5/24 - right thoracentesis, 750 milliliters of hazy yellow fluid removed  Pt initially sleeping at the time of assessment but wife at bedside provides a hx. Reports that she has noticed pt's energy levels and weight have been declining for several months. 7.6% weight loss noted in the last 3 months (2/22-5/23) which is severe for timeframe. Wife doesn't notice a huge change in eating habits until the last few days.   On exam, significant fat and muscle depletions present. Pt did wake and we discussed the importance of preventing further weight and muscle loss. Discussed the importance of optimizing nutrition status as he moves forward in making a plan with oncology. Attached "High Calorie/High Protein Nutrition Therapy" handout to AVS for use at home.   Pt did have ensures present on  tray from dinner. Consumed 2 prior to being made NPO at midnight.  Nutritionally Relevant Medications: Scheduled Meds:  calcitonin  4 Units/kg Subcutaneous BID   dexamethasone injection  4 mg Intravenous Q6H   insulin aspart  0-20 Units Subcutaneous TID WC   insulin aspart  0-5 Units Subcutaneous QHS   insulin glargine-yfgn  25 Units Subcutaneous Daily   Continuous Infusions:  sodium chloride 200 mL/hr at 07/17/2021 0617   magnesium sulfate bolus IVPB     PRN Meds: ondansetron  Labs Reviewed: Sodium 132 K 5.2 BUN 52, creatinine 2.39 Calcium 12.7 Mg 1.6 CBG ranges from 132-348 mg/dL over the last 24 hours HgbA1c 12.2% (5/22)  NUTRITION - FOCUSED PHYSICAL EXAM: Flowsheet Row Most Recent Value  Orbital Region Mild depletion  Upper Arm Region Severe depletion  Thoracic and Lumbar Region Severe depletion  Buccal Region Mild depletion  Temple Region Mild depletion  Clavicle Bone Region Moderate depletion  Clavicle and Acromion Bone Region Moderate depletion  Scapular Bone Region Moderate depletion  Dorsal Hand Mild depletion  Patellar Region Severe depletion  Anterior Thigh Region Severe depletion  Posterior Calf Region Severe depletion  Edema (RD Assessment) None  Hair Reviewed  Eyes Reviewed  Mouth Unable to assess  Skin Reviewed  Nails Reviewed   Diet Order:   Diet Order             Diet NPO time specified Except for: Sips with Meds  Diet effective now                   EDUCATION NEEDS:  Not appropriate for education at this time  Skin:  Skin Assessment: Reviewed RN Assessment  Last BM:  5/24  Height:  Ht Readings from Last 1 Encounters:  07/07/2021 6' (1.829 m)    Weight:  Wt Readings from Last 1 Encounters:  07/17/2021 68 kg    Ideal Body Weight:  80.9 kg  BMI:  Body mass index is 20.34 kg/m.  Estimated Nutritional Needs:  Kcal:  2300-2500 kcal/d Protein:  115-130g/d Fluid:  2.3-2.5 g/d   Ranell Patrick, RD, LDN Clinical Dietitian RD  pager # available in [place]  After hours/weekend pager # available in [facility]

## 2021-07-30 ENCOUNTER — Encounter (HOSPITAL_COMMUNITY): Payer: Self-pay | Admitting: Orthopedic Surgery

## 2021-07-30 ENCOUNTER — Inpatient Hospital Stay (HOSPITAL_COMMUNITY): Payer: 59

## 2021-07-30 DIAGNOSIS — N179 Acute kidney failure, unspecified: Secondary | ICD-10-CM | POA: Diagnosis not present

## 2021-07-30 LAB — CBC
HCT: 25.5 % — ABNORMAL LOW (ref 39.0–52.0)
Hemoglobin: 8 g/dL — ABNORMAL LOW (ref 13.0–17.0)
MCH: 28.8 pg (ref 26.0–34.0)
MCHC: 31.4 g/dL (ref 30.0–36.0)
MCV: 91.7 fL (ref 80.0–100.0)
Platelets: 224 10*3/uL (ref 150–400)
RBC: 2.78 MIL/uL — ABNORMAL LOW (ref 4.22–5.81)
RDW: 13.2 % (ref 11.5–15.5)
WBC: 24 10*3/uL — ABNORMAL HIGH (ref 4.0–10.5)
nRBC: 0 % (ref 0.0–0.2)

## 2021-07-30 LAB — BASIC METABOLIC PANEL
Anion gap: 13 (ref 5–15)
Anion gap: 10 (ref 5–15)
BUN: 71 mg/dL — ABNORMAL HIGH (ref 6–20)
BUN: 76 mg/dL — ABNORMAL HIGH (ref 6–20)
CO2: 15 mmol/L — ABNORMAL LOW (ref 22–32)
CO2: 16 mmol/L — ABNORMAL LOW (ref 22–32)
Calcium: 10.4 mg/dL — ABNORMAL HIGH (ref 8.9–10.3)
Calcium: 9.9 mg/dL (ref 8.9–10.3)
Chloride: 105 mmol/L (ref 98–111)
Chloride: 103 mmol/L (ref 98–111)
Creatinine, Ser: 2.65 mg/dL — ABNORMAL HIGH (ref 0.61–1.24)
Creatinine, Ser: 2.79 mg/dL — ABNORMAL HIGH (ref 0.61–1.24)
GFR, Estimated: 27 mL/min — ABNORMAL LOW (ref >60)
GFR, Estimated: 26 mL/min — ABNORMAL LOW (ref >60)
Glucose, Bld: 506 mg/dL (ref 70–99)
Glucose, Bld: 473 mg/dL — ABNORMAL HIGH (ref 70–99)
Potassium: 6.2 mmol/L — ABNORMAL HIGH (ref 3.5–5.1)
Potassium: 5.2 mmol/L — ABNORMAL HIGH (ref 3.5–5.1)
Sodium: 133 mmol/L — ABNORMAL LOW (ref 135–145)
Sodium: 129 mmol/L — ABNORMAL LOW (ref 135–145)

## 2021-07-30 LAB — GLUCOSE, CAPILLARY
Glucose-Capillary: 545 mg/dL (ref 70–99)
Glucose-Capillary: 517 mg/dL (ref 70–99)
Glucose-Capillary: 495 mg/dL — ABNORMAL HIGH (ref 70–99)
Glucose-Capillary: 384 mg/dL — ABNORMAL HIGH (ref 70–99)
Glucose-Capillary: 295 mg/dL — ABNORMAL HIGH (ref 70–99)

## 2021-07-30 LAB — CREATININE, SERUM
Creatinine, Ser: 2.74 mg/dL — ABNORMAL HIGH (ref 0.61–1.24)
GFR, Estimated: 26 mL/min — ABNORMAL LOW (ref >60)

## 2021-07-30 LAB — POTASSIUM
Potassium: 4.9 mmol/L (ref 3.5–5.1)
Potassium: 4.6 mmol/L (ref 3.5–5.1)

## 2021-07-30 LAB — PTH, INTACT AND CALCIUM
Calcium, Total (PTH): 13.2 mg/dL (ref 8.7–10.2)
PTH: 8 pg/mL — ABNORMAL LOW (ref 15–65)

## 2021-07-30 IMAGING — DX DG HIP (WITH OR WITHOUT PELVIS) 2-3V*L*
3 series · 3 of 3 positions shown · non-contrast
Comparison: None Available.

CLINICAL DATA: Left hip replacement

EXAM:
DG HIP (WITH OR WITHOUT PELVIS) 1V LEFT

[pelvis ap]
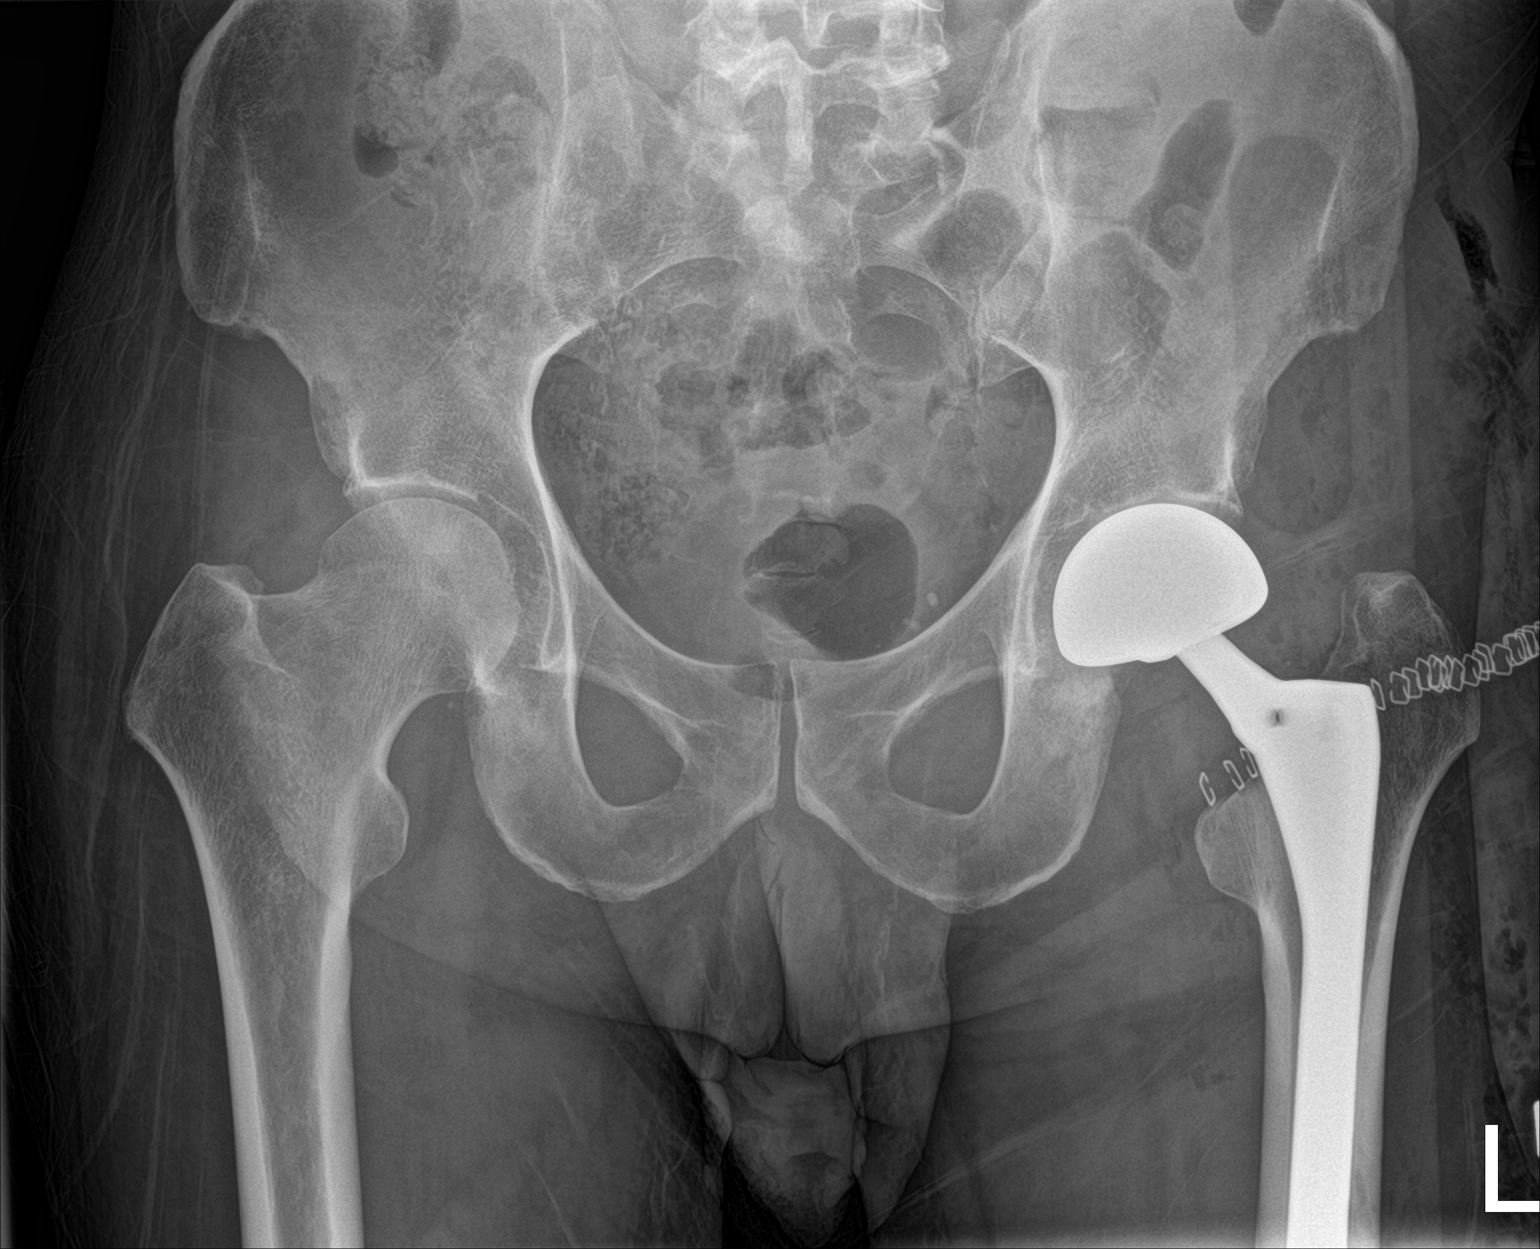

[hip ap]
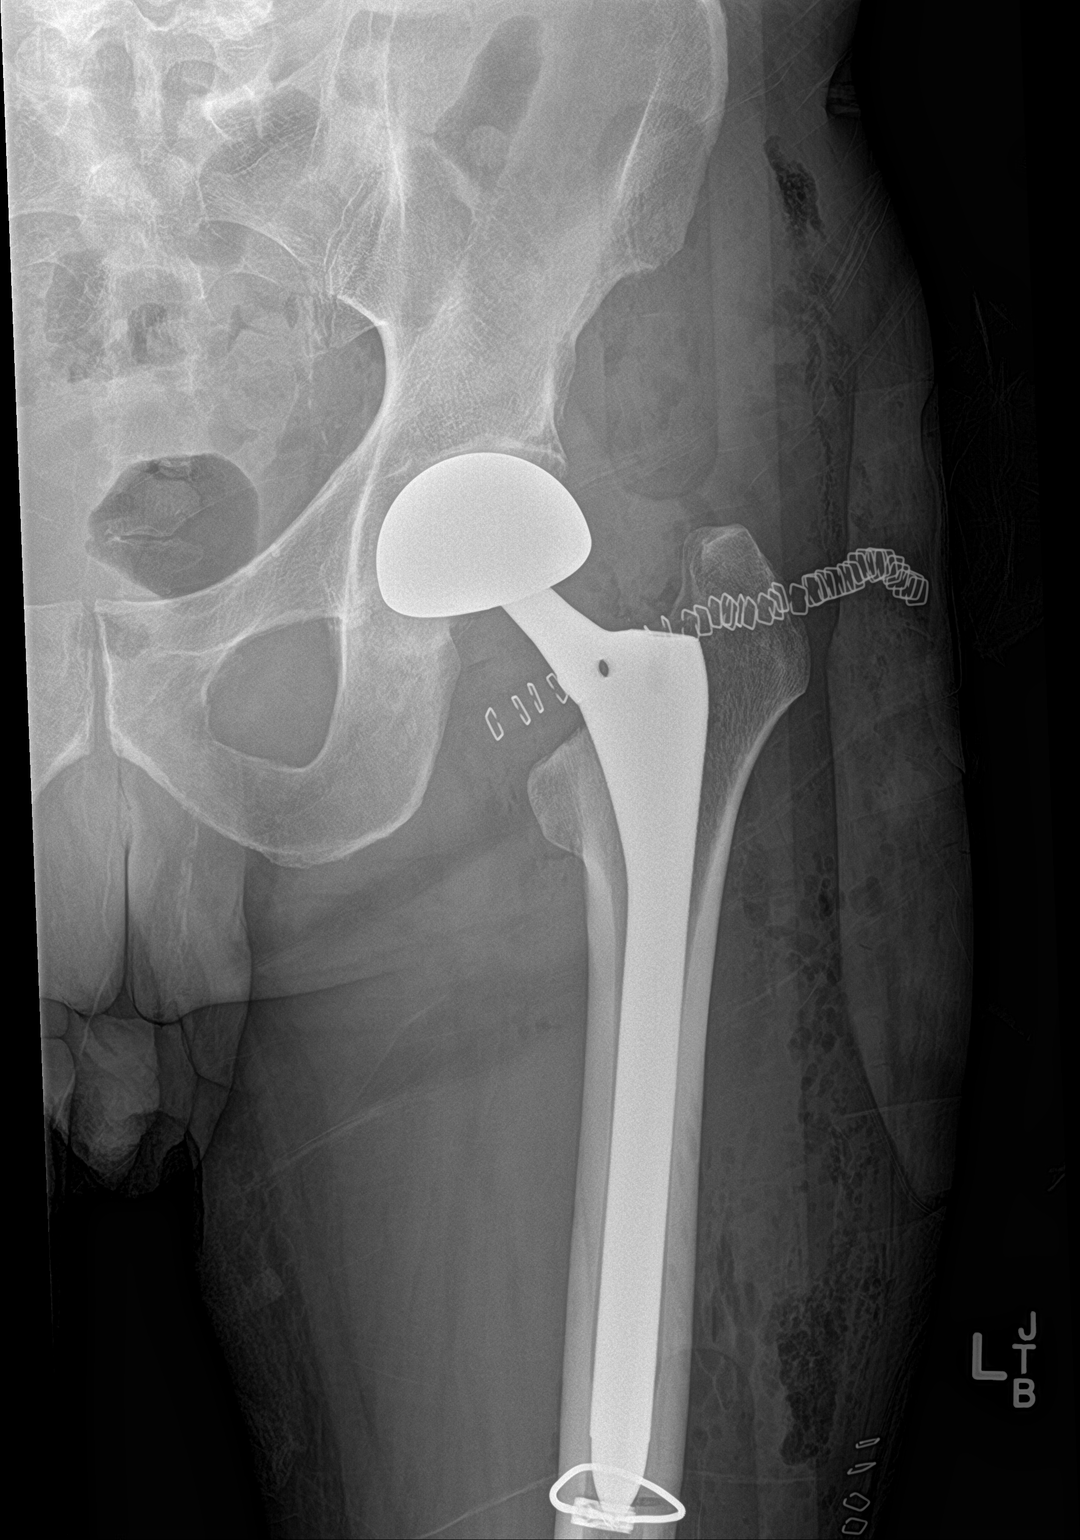

[hip lat]
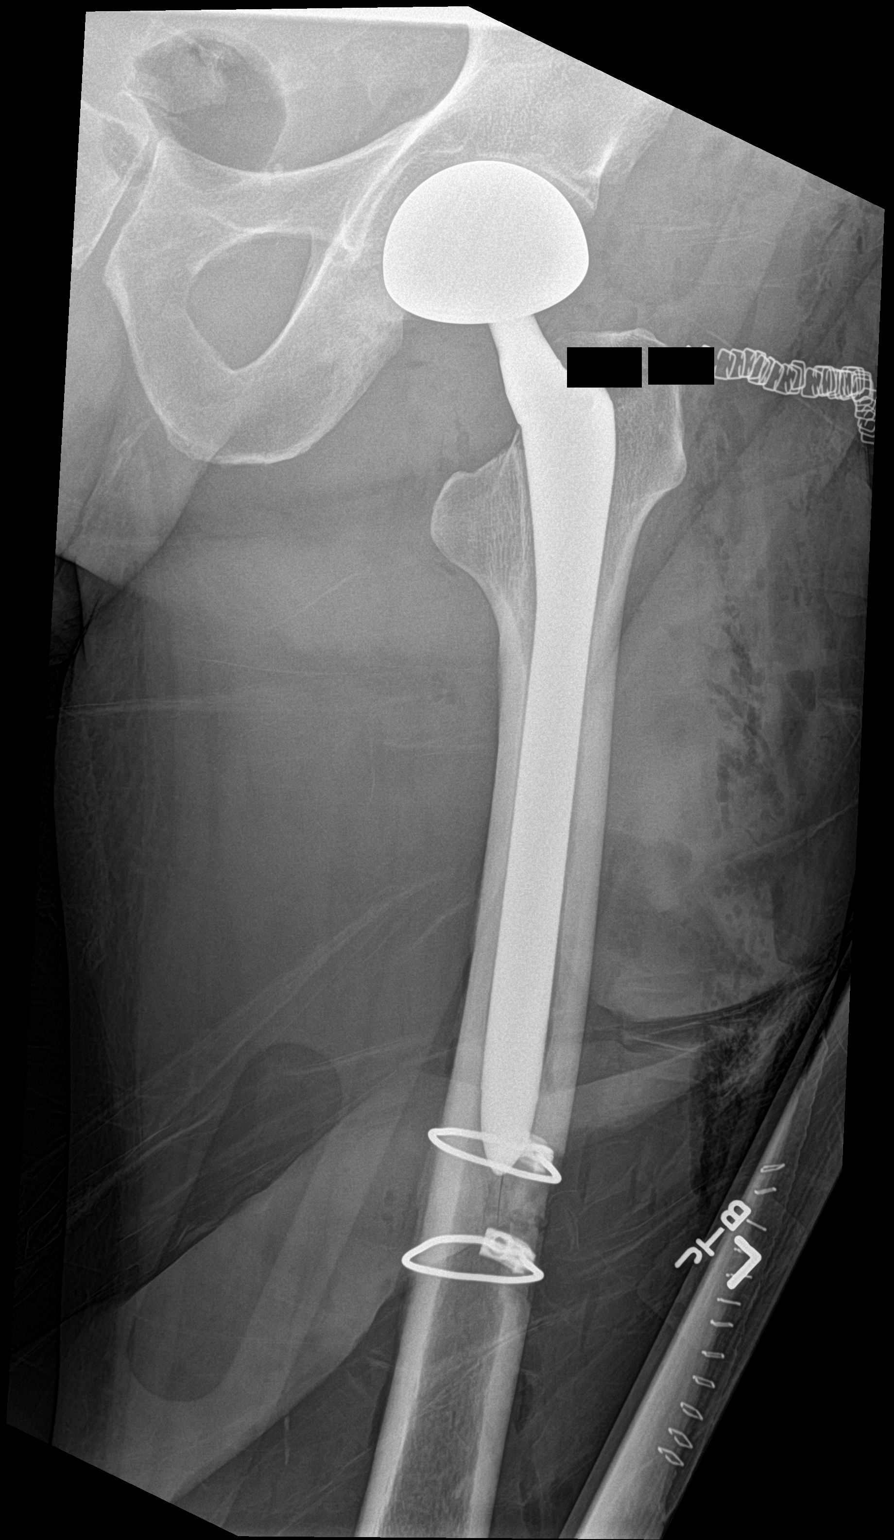

[3 of 3 positions shown; findings below may reference images not displayed]

FINDINGS: Interval postsurgical changes from left total hip arthroplasty.
Arthroplasty components appear in their expected alignment. No
periprosthetic fracture is identified. Expected postoperative
changes within the overlying soft tissues.
IMPRESSION: Expected postsurgical changes of left total hip arthroplasty.

## 2021-07-30 MED ORDER — INSULIN ASPART 100 UNIT/ML IJ SOLN
25.0000 [IU] | Freq: Once | INTRAMUSCULAR | Status: AC
  Administered 2021-07-30: 25 [IU] via SUBCUTANEOUS

## 2021-07-30 MED ORDER — INSULIN ASPART 100 UNIT/ML IJ SOLN
20.0000 [IU] | Freq: Once | INTRAMUSCULAR | Status: AC
  Administered 2021-07-30: 20 [IU] via SUBCUTANEOUS

## 2021-07-30 MED ORDER — INSULIN ASPART 100 UNIT/ML IV SOLN
10.0000 [IU] | Freq: Once | INTRAVENOUS | Status: AC
  Administered 2021-07-30: 10 [IU] via INTRAVENOUS

## 2021-07-30 MED ORDER — ENOXAPARIN SODIUM 30 MG/0.3ML IJ SOSY
30.0000 mg | PREFILLED_SYRINGE | Freq: Every day | INTRAMUSCULAR | Status: DC
  Administered 2021-07-31 – 2021-08-01 (×2): 30 mg via SUBCUTANEOUS
  Filled 2021-07-30 (×2): qty 0.3

## 2021-07-30 MED ORDER — TAMSULOSIN HCL 0.4 MG PO CAPS
0.4000 mg | ORAL_CAPSULE | Freq: Every day | ORAL | Status: DC
Start: 2021-07-30 — End: 2021-08-02
  Administered 2021-07-30 – 2021-08-01 (×3): 0.4 mg via ORAL
  Filled 2021-07-30 (×3): qty 1

## 2021-07-30 MED ORDER — SODIUM ZIRCONIUM CYCLOSILICATE 10 G PO PACK
10.0000 g | PACK | Freq: Two times a day (BID) | ORAL | Status: DC
  Administered 2021-07-30 – 2021-07-31 (×3): 10 g via ORAL
  Filled 2021-07-30 (×3): qty 1

## 2021-07-30 MED ORDER — DEXAMETHASONE SODIUM PHOSPHATE 4 MG/ML IJ SOLN: 4.0000 mg | Freq: Two times a day (BID) | INTRAMUSCULAR | Status: DC

## 2021-07-30 MED ORDER — PANTOPRAZOLE SODIUM 40 MG PO TBEC
40.0000 mg | DELAYED_RELEASE_TABLET | Freq: Every day | ORAL | Status: DC
  Administered 2021-07-30 – 2021-08-01 (×3): 40 mg via ORAL
  Filled 2021-07-30 (×3): qty 1

## 2021-07-30 MED ORDER — SODIUM CHLORIDE 0.9 % IV SOLN: INTRAVENOUS | Status: DC

## 2021-07-30 MED ORDER — LIDOCAINE HCL URETHRAL/MUCOSAL 2 % EX GEL
1.0000 "application " | Freq: Once | CUTANEOUS | Status: AC
  Administered 2021-07-30: 1 via URETHRAL
  Filled 2021-07-30 (×2): qty 6

## 2021-07-30 MED ORDER — STERILE WATER FOR INJECTION IV SOLN
INTRAVENOUS | Status: DC
  Filled 2021-07-30: qty 150
  Filled 2021-07-30 (×2): qty 1000

## 2021-07-30 MED ORDER — DEXAMETHASONE SODIUM PHOSPHATE 10 MG/ML IJ SOLN
8.0000 mg | Freq: Two times a day (BID) | INTRAMUSCULAR | Status: AC
  Administered 2021-07-30 – 2021-07-31 (×2): 8 mg via INTRAVENOUS
  Filled 2021-07-30 (×2): qty 0.8

## 2021-07-30 MED ORDER — DEXAMETHASONE SODIUM PHOSPHATE 4 MG/ML IJ SOLN
4.0000 mg | Freq: Three times a day (TID) | INTRAMUSCULAR | Status: DC
  Filled 2021-07-30: qty 1

## 2021-07-30 MED ORDER — ENSURE ENLIVE PO LIQD
237.0000 mL | Freq: Two times a day (BID) | ORAL | Status: DC
  Administered 2021-07-30 – 2021-08-01 (×5): 237 mL via ORAL

## 2021-07-30 NOTE — Progress Notes (Signed)
Notified by RN that patient is having acute urinary retention with repeat bladder scan amount >420m. In and out previously attempted x 2 with resistance met. No prior hx of urinary retention or urological issues on chart review. CT abd/pelvis with no obstruction identified.  Plan -Will attempt 3rd in and out per policy if unsuccessful or still with significant residual >300 cc post void will place a caude catheter -Consider urology consult if above does not resolve   ERufina Falco DNP, CCRN, FNP-C, AGACNP-BC Acute Care & Family Nurse Practitioner  LRandolph See Amion for personal pager PCCM on call pager ([phone] 7 am

## 2021-07-30 NOTE — Progress Notes (Signed)
Bladder scan 317ml. Order to I&O cath

## 2021-07-30 NOTE — Progress Notes (Addendum)
PROGRESS NOTE    Michael Fritz  BSJ:[id number] DOB: [birth year] DOA: 07/12/2021 PCP: Kathyrn Drown, MD    Brief Narrative:   Michael Fritz is a 56 y.o. male with past medical history significant for essential hypertension, insulin-dependent diabetes mellitus, hyperlipidemia, tobacco abuse disorder who presented to [facility] ED by direction of his primary care office for abnormal labs.  Patient reports generalized weakness, unintentional weight loss of 12 pounds over the last several months.  Also complaining of left-sided hip pain that is worse with ambulation.  Patient denies any chest pain, no abdominal pain, no headache, no visual changes.  Day prior, patient went to see his PCP ordered blood work that showed a calcium of 12.9 and was told to come to the ED for further evaluation.  In the ED, temperature 97.7 F, HR 63, RR 17, BP 120/53, SPO2 90% on room air.  WBC 20.1, hemoglobin 10.9, platelets 269.  Sodium 132, potassium 5.3, chloride 99, CO2 25, glucose 318, BUN 28, creatinine 2.28, calcium 14.0, AST 32, ALT 38, total bilirubin 0.8.  Urinalysis unrevealing.  Chest x-ray with stable dense right upper lobe consolidation compatible with pneumonia versus mass.  CT chest/abdomen/pelvis without contrast with mass involving majority of right upper lobe with soft tissue extending along the right upper lobe and right mainstem bronchus along the carina into the AP window concerning for primary lung malignancy, widespread osseous metastatic disease with osseous lesion involving left femoral head and neck cortex which concerning for impending pathologic fracture, moderate right pleural effusion, multiple hypoechoic masses throughout the liver, fullness left adrenal gland concerning for metastases, pathologic compression fracture T5.  Hospitalist service consulted for admission for likely primary lung cancer with metastatic disease, hypercalcemia of malignancy, acute renal failure.  Assessment & Plan:    Right upper lobe mass concerning for primary lung malignancy with metastasis to liver, left adrenal, bone CT chest/abdomen/pelvis without contrast with mass involving majority of right upper lobe with soft tissue extending along the right upper lobe and right mainstem bronchus along the carina into the AP window concerning for primary lung malignancy, widespread osseous metastatic disease with osseous lesion involving left femoral head and neck cortex which concerning for impending pathologic fracture, moderate right pleural effusion, multiple hypoechoic masses throughout the liver, fullness left adrenal gland concerning for metastases, pathologic compression fracture T5. --Oncology consulted, Dr. Alen Blew --S/p thoracentesis 5/24, cytology shows no malignant cells --Left resected femur sent for pathology, pending --IR consulted for kyphoplasty/bone biopsy T5/7, pending insurance authorization  Impending pathologic left femur fracture s/p THA CT pelvis without contrast with focal lytic destruction involving the left femoral neck and head consistent with metastatic disease.  Patient underwent anterior total hip arthroplasty on 07/25/2021 by Dr. Lyla Glassing. --WBAT w/ walker --PT/OT evaluation --Postoperative DVT prophylaxis with Lovenox --Will need radiation therapy to right subtrochanteric region of the femur and left proximal femur --If develops right hip pain, will likely need a prophylactic cephalomedullary nail --Outpatient follow-up with orthopedics, 2 weeks  Postoperative blood loss anemia --Hgb 10.9>1.7>10.4>8.0 --Transfuse to maintain hemoglobin greater than 7.0 --CBC daily  Pathologic T5/T7 compression fracture --IR following for T5/7 biopsy/ablation/kyphoplasty, pending insurance authorization  Hypercalcemia of malignancy Calcium 14.0 on arrival, etiology likely secondary to diffuse malignancy from likely primary lung cancer. --Ca 14.0>13.6>12.7>10.4 --iPTH: 8, low --PTHrP:  pending --s/p pamidronate on 5/24 --NS at 200 mL/h --Calcitonin BID --Decadron tapering to 8 mg IV q12h today, once tomorrow and stop  Acute renal failure Creatinine 2.28 on admission.  Etiology  likely secondary to hypercalcemia.  Uric acid level within normal limits.  No urine obstruction noted on CT abdomen/pelvis. --Cr 2.28>2.35>2.39>2.74>2.65 --Holding home metformin/ARB --Continue IV fluid hydration as above --Avoid nephrotoxins, renally dose all medications --BMP daily  Hyperkalemia Potassium 6.2 this morning with slight hemolysis, likely secondary to acute renal failure. --Insulin 10 units IV x1 today --Lokelma 10 g p.o. twice daily today --repeat BMP this afternoon and qAM  Pleural effusion, likely malignant Underwent thoracentesis on 07/28/2021 by IR with 750 mL of fluid removed.  Cytology with no malignant cells identified, although low yield.  Hyponatremia Sodium 132 on arrival.  Suspect etiology related to lung cancer with SIADH.  Permitted by elevated glucose in the setting of IV dexamethasone use. --Na 132>133 --Continue IV fluid hydration as above --Glucose control --BMP daily  Leukocytosis Patient is afebrile, WBC count elevated 20.1.  Denies productive cough.  Low suspicion for infectious etiology and holding antibiotics for now. --CBC daily  Type 2 diabetes with hyperglycemia Hemoglobin A1c 12.2 on 07/26/2021, poorly controlled.  Likely complicated by current steroid use for hypercalcemia of malignancy as above.  Home medications include Lantus 55-55 units daily, metformin 500 mg twice daily. --Diabetic educator following, appreciate assistance --Semglee 22u [place] BID --Resistant SSI for coverage --CBGs qAC/HS  Essential hypertension Home medication includes losartan 160 mg p.o. daily. --Holding ARB in the setting of AKI as above --Start amlodipine 10 mg p.o. daily --Hydralazine 25 mg PO q6h PRN SBP >150 or DBP >100 --Continue monitor BP  closely  Hyperlipidemia Currently not taking statin at home. --Outpatient follow-up PCP  Tobacco use disorder Counseled on need for complete cessation given new diagnosis of likely metastatic lung cancer. --Nicotine patch   DVT prophylaxis: SCDs Start: 07/07/2021 2345 enoxaparin (LOVENOX) injection 40 mg Start: 07/28/21 1030 Place and maintain sequential compression device Start: 07/11/2021 1646    Code Status: Full Code Family Communication: Updated spouse present at bedside  Disposition Plan:  Level of care: Med-Surg Status is: Inpatient Remains inpatient appropriate because: pending PT/OT evaluation, pending insurance authorization for T5/7 kyphoplasty, needs better control of glucose and still treating for elevated calcium level.  Will eventually need radiation evaluation at [facility] long following kyphoplasty.    Consultants:  Oncology, Dr. Alen Blew Orthopedics, Dr. Lyla Glassing Interventional radiology  Procedures:  Thoracentesis 5/24 Anterior approach total hip arthroplasty, left; Dr. Lyla Glassing 07/21/2021  Antimicrobials:  None   Subjective: Patient seen examined bedside, lying in bed.  Spouse present.  Complaining of left hip pain.  Underwent left hip arthroplasty by Dr. Lyla Glassing yesterday.  Received additional dose of IV Decadron perioperatively and now blood sugars in the 500s this morning.  We will aggressively treat as well as his elevated potassium, although with slight hemolysis.  Discussed with patient and spouse results of cytology from thoracentesis that showed no malignant cells; although this is a low yield test to identify malignancy.  No other complaints or concerns at this time.  Denies headache, no visual changes, no chest pain, no palpitations, no shortness of breath, no abdominal pain, no fever/chills/night sweats, no nausea/vomiting/diarrhea, no congestion, no fatigue, no focal weakness, no paresthesias.  No acute events overnight per nursing  staff.  Objective: Vitals:   07/12/2021 2300 07/11/2021 2340 07/30/21 0520 07/30/21 0823  BP:  130/73 (!) 129/45 (!) 128/57  Pulse:  81 79 81  Resp:  16 17 18   Temp: 97.6 F (36.4 C) 97.7 F (36.5 C) (!) 97.5 F (36.4 C) (!) 97.5 F (36.4 C)  TempSrc:  Oral Oral  SpO2:  97% 96% 96%  Weight:      Height:        Intake/Output Summary (Last 24 hours) at 07/30/2021 1018 Last data filed at 07/30/2021 1012 Gross per 24 hour  Intake 3029.13 ml  Output 1350 ml  Net 1679.13 ml   Filed Weights   07/15/2021 1032  Weight: 68 kg    Examination:  Physical Exam: GEN: NAD, alert and oriented x 3, wd/wn HEENT: NCAT, PERRL, EOMI, sclera clear, MMM PULM: CTAB w/o wheezes/crackles, normal respiratory effort, on room air CV: RRR w/o M/G/R GI: abd soft, NTND, NABS, no R/G/M MSK: no peripheral edema, noted left surgical incision sites with dressings in place, clean/dry/intact NEURO: CN II-XII intact, no focal deficits, sensation to light touch intact PSYCH: normal mood/affect Integumentary: dry/intact, no rashes or wounds    Data Reviewed: I have personally reviewed following labs and imaging studies  CBC: Recent Labs  Lab 07/26/21 1632 07/11/2021 1021 07/28/21 0316 07/10/2021 0214 07/30/21 0440  WBC 26.1* 20.1* 20.2* 18.9* 24.0*  NEUTROABS  --  16.8*  --   --   --   HGB 12.9* 10.9* 10.7* 10.4* 8.0*  HCT 39.2 33.6* 33.3* 31.2* 25.5*  MCV 88 90.8 89.0 88.6 91.7  PLT 363 269 279 248 106   Basic Metabolic Panel: Recent Labs  Lab 07/26/21 1632 07/15/2021 1021 07/28/21 0316 07/05/2021 0214 07/30/21 0440 07/30/21 0703  NA 134 132* 133* 132*  --  133*  K 5.0 5.3* 5.3* 5.2*  --  6.2*  CL 96 99 103 104  --  105  CO2 25 25 23 22   --  15*  GLUCOSE 144* 318* 282* 223*  --  506*  BUN 31* 38* 44* 52*  --  71*  CREATININE 1.87* 2.28* 2.35* 2.39* 2.74* 2.65*  CALCIUM 14.9* 14.0* 13.6*  13.2 12.7*  --  10.4*  MG  --   --   --  1.6*  --   --    GFR: Estimated Creatinine Clearance: 29.6  mL/min (A) (by C-G formula based on SCr of 2.65 mg/dL (H)). Liver Function Tests: Recent Labs  Lab 07/26/21 1632 07/28/2021 1021  AST 46* 32  ALT 57* 38  ALKPHOS 866* 570*  BILITOT 0.3 0.8  PROT 7.0 5.8*  ALBUMIN 3.5* 2.4*   No results for input(s): LIPASE, AMYLASE in the last 168 hours. No results for input(s): AMMONIA in the last 168 hours. Coagulation Profile: No results for input(s): INR, PROTIME in the last 168 hours. Cardiac Enzymes: Recent Labs  Lab 07/21/2021 1952  CKTOTAL 29*   BNP (last 3 results) No results for input(s): PROBNP in the last 8760 hours. HbA1C: No results for input(s): HGBA1C in the last 72 hours.  CBG: Recent Labs  Lab 07/18/2021 1153 07/23/2021 1428 07/19/2021 2056 07/30/21 0825 07/30/21 1011  GLUCAP 287* 199* 267* 545* 517*   Lipid Profile: No results for input(s): CHOL, HDL, LDLCALC, TRIG, CHOLHDL, LDLDIRECT in the last 72 hours.  Thyroid Function Tests: No results for input(s): TSH, T4TOTAL, FREET4, T3FREE, THYROIDAB in the last 72 hours. Anemia Panel: No results for input(s): VITAMINB12, FOLATE, FERRITIN, TIBC, IRON, RETICCTPCT in the last 72 hours. Sepsis Labs: No results for input(s): PROCALCITON, LATICACIDVEN in the last 168 hours.  Recent Results (from the past 240 hour(s))  Surgical pcr screen     Status: None   Collection Time: 07/18/2021  3:23 PM   Specimen: Nasal Mucosa; Nasal Swab  Result Value Ref Range Status  MRSA, PCR NEGATIVE NEGATIVE Final   Staphylococcus aureus NEGATIVE NEGATIVE Final    Comment: (NOTE) The Xpert SA Assay (FDA approved for NASAL specimens in patients 64 years of age and older), is one component of a comprehensive surveillance program. It is not intended to diagnose infection nor to guide or monitor treatment. Performed at [facility], [place] [address], [place], [place] [address]          Radiology Studies: DG Chest 1 View  Result Date: 07/28/2021 CLINICAL DATA:  Post  thoracentesis right side EXAM: CHEST  1 VIEW COMPARISON:  CT 07/23/2021 FINDINGS: Decreased right pleural effusion after thoracentesis. Unchanged right upper lung mass and consolidation. Left lung is clear. No pneumothorax. Multiple osseous metastatic lesions, better visualized on recent CT. IMPRESSION: Decreased right pleural effusion after thoracentesis. No evidence of pneumothorax. Unchanged right upper lung mass and surrounding lung consolidation. Electronically Signed   By: Maurine Simmering M.D.   On: 07/28/2021 13:24   MR THORACIC SPINE WO CONTRAST  Result Date: 07/25/2021 CLINICAL DATA:  Compression fracture EXAM: MRI THORACIC SPINE WITHOUT CONTRAST TECHNIQUE: Multiplanar, multisequence MR imaging of the thoracic spine was performed. No intravenous contrast was administered. COMPARISON:  No prior MRI, correlation is made with CT chest abdomen pelvis 07/25/2021 FINDINGS: Alignment:  Physiologic. Vertebrae: Abnormal marrow signal is seen at every level, with decreased T1 and increased T2 signal throughout the T1, T3, T7, T10, T12, and L1 vertebral bodies, with the remaining thoracic spine demonstrating more focal lesions than diffuse involvement. 50% vertebral body height loss in T5, with bowing of the posterior cortex, most likely an acute to subacute pathologic fracture. 15% vertebral body height loss in T7, without definite bowing of the posterior cortex, which may also represent a pathologic fracture of indeterminate acuity. Lesser vertebral body height loss at T8, T9, and T10 does not appear acute. Multifocal involvement of the posterior elements. Abnormal signal is also noted in the imaged portions of the proximal left second sixth, eighth, tenth, and twelfth ribs and proximal right seventh, eighth and tenth ribs. Cord:  Normal signal and morphology. Paraspinal and other soft tissues: Mass involving the majority of the right upper lobe is better evaluated on the 07/21/2021 CT chest abdomen pelvis. Small  pleural effusions, decreased on the right and stable to slightly increased on the left. T2 hyperintense lesions in the liver, which could represent metastatic disease but could also be cystic. Disc levels: No spinal canal stenosis. The bowing of the posterior cortex of T5 narrows the spinal canal somewhat but does not cause significant stenosis. No significant neural foraminal narrowing. IMPRESSION: 1. 50% vertebral body height loss at T5, with bowing of the posterior cortex, most likely an acute to subacute pathologic fracture. 2. 15% vertebral body height loss at T7, which may represent a pathologic fracture of indeterminate acuity. 3. Diffusely abnormal marrow signal, concerning for metastatic disease. 4. Right lung mass and hepatic lesions are better evaluated on the prior CT. 5. Interval decrease in the size of the previously noted right pleural effusion, with stable or slightly increased in size left pleural effusion. Electronically Signed   By: Merilyn Baba M.D.   On: 07/15/2021 01:04   MR FEMUR LEFT WO CONTRAST  Result Date: 07/28/2021 CLINICAL DATA:  Metastatic disease evaluation EXAM: MR OF THE LEFT FEMUR WITHOUT CONTRAST TECHNIQUE: Multiplanar, multisequence MR imaging of the left femur was performed. No intravenous contrast was administered. COMPARISON:  Pelvis CT 07/28/2021. FINDINGS: Bones/Joint/Cartilage There are T2 hyperintense/T1 hypointense  metastases within the proximal femurs bilaterally, including in the femoral heads, necks, intertrochanteric, and subtrochanteric regions. There is a lytic destructive lesion of the left femoral neck which involves up to 40% of the femoral neck width, at high risk for pathologic fracture. There is a large metastasis in the proximal right subtrochanteric femur which is also likely high risk for pathologic fracture, partially imaged only in the coronal plane but involving a significant portion of the subtrochanteric femur width. Partially visualized osseous  metastatic lesions throughout the pelvis, most confluent in the left posterior acetabulum and ischial tuberosity. Muscles and Tendons There is feathery intramuscular edema within the hip adductors bilaterally, likely reactive. There is inter fascial edema throughout the left thigh without focal collection. Soft tissues Subcutaneous edema of the left thigh. There is trace nonspecific free fluid in the pelvis, partially visualized. IMPRESSION: Multiple osseous metastases within the proximal femurs bilaterally, most concerning in the left femoral neck and right subtrochanteric femur, both high risk for pathologic fracture. Partially visualized osseous metastatic disease throughout the pelvis, most confluent in the left posterior acetabulum. Electronically Signed   By: Maurine Simmering M.D.   On: 07/28/2021 13:22   DG C-Arm 1-60 Min-No Report  Result Date: 07/06/2021 Fluoroscopy was utilized by the requesting physician.  No radiographic interpretation.   DG C-Arm 1-60 Min-No Report  Result Date: 07/26/2021 Fluoroscopy was utilized by the requesting physician.  No radiographic interpretation.   DG C-Arm 1-60 Min-No Report  Result Date: 07/16/2021 Fluoroscopy was utilized by the requesting physician.  No radiographic interpretation.   DG C-Arm 1-60 Min-No Report  Result Date: 07/25/2021 Fluoroscopy was utilized by the requesting physician.  No radiographic interpretation.   DG HIP UNILAT WITH PELVIS 1V LEFT  Result Date: 07/26/2021 CLINICAL DATA:  Total hip hemiarthroplasty EXAM: DG HIP (WITH OR WITHOUT PELVIS) 1V*L* COMPARISON:  07/26/2021 FINDINGS: Changes of left hip hemiarthroplasty. Normal AP alignment. No hardware bony complicating feature. IMPRESSION: Intraoperative imaging as above.  No complicating feature. Electronically Signed   By: Rolm Baptise M.D.   On: 07/28/2021 20:18        Scheduled Meds:  amLODipine  10 mg Oral Daily   dexamethasone (DECADRON) injection  4 mg Intravenous Q6H    docusate sodium  100 mg Oral BID   enoxaparin (LOVENOX) injection  40 mg Subcutaneous Daily   feeding supplement  237 mL Oral BID BM   fentaNYL       insulin aspart  0-20 Units Subcutaneous TID WC   insulin aspart  0-5 Units Subcutaneous QHS   insulin aspart  25 Units Subcutaneous Once   insulin glargine-yfgn  22 Units Subcutaneous BID   nicotine  21 mg Transdermal Daily   pantoprazole  40 mg Oral Daily   senna  1 tablet Oral BID   sodium zirconium cyclosilicate  10 g Oral BID   Continuous Infusions:  sodium chloride Stopped (07/11/2021 1526)   sodium chloride 150 mL/hr at 07/30/21 0001   methocarbamol (ROBAXIN) IV       LOS: 3 days    Time spent: 59 minutes spent on chart review, discussion with nursing staff, consultants, updating family and interview/physical exam; more than 50% of that time was spent in counseling and/or coordination of care.    Junella Domke J [place] DO [facility] Available via Epic secure chat 7am-7pm After these hours, please refer to coverage provider listed on [email or web address] 07/30/2021, 10:18 AM

## 2021-07-30 NOTE — Progress Notes (Addendum)
Inpatient Diabetes Program Recommendations  AACE/ADA: New Consensus Statement on Inpatient Glycemic Control (2015)  Target Ranges:  Prepandial:   less than 140 mg/dL      Peak postprandial:   less than 180 mg/dL (1-2 hours)      Critically ill patients:  140 - 180 mg/dL    Latest Reference Range & Units 07/15/2021 07:38 07/08/2021 11:53 07/20/2021 14:28 07/30/2021 20:56  Glucose-Capillary 70 - 99 mg/dL 345 (H)  15 units Novolog  25 units Semglee _0   287 (H)  11 units Novolog  199 (H) 267 (H)    Latest Reference Range & Units 07/30/21 08:25 07/30/21 10:11  Glucose-Capillary 70 - 99 mg/dL 545 (HH)  10 units NOVOLOG 517 (HH)  25 units NOVOLOG    Admit with:  Right upper lobe mass concerning for primary lung malignancy with metastasis to liver, left adrenal, bone Impending pathologic left femur fracture s/p THA Pathologic T5/T7 compression fracture Hypercalcemia of malignancy Acute renal failure Hyperkalemia Pleural effusion, likely malignant Hyponatremia  History: DM  Home DM Meds: Lantus 50-55 units daily        Metformin 500 mg BID  Current Orders: Semglee 22 units BID     Novolog Resistant Correction Scale/ SSI (0-20 units) TID AC + HS     Decadron 8 mg BID (reduced today)     Decadron 8 mg BID      MD- Note CBGs extremely high this AM.  Note that Semglee dose has been increased to 22 units BID starting this AM.  Got an extra 10 mg Decadron yesterday at 4pm for Hip Surgery.  Also note BMET showed CO2 level down to 15 completed at 7am today--Pt receiving NS IVF at 200cc/hr  Please consider:  1. Recheck BMET today after IVF  2. Increase frequency of the Novolog SSI to Q4 hours while CBGs elevated    Addendum 11:30am--Met w/ pt and family at bedside.  Discussed with pt current blood sugars and reason for Hyperglycemia likely being Steroid treatment.  Explained to pt that IVF are being given and insulins are being adjusted.  Also discussed with pt that his  current A1c is 12.2%.  Pt told me his last A1c was around 10%--Told me he recently travelled abroad and ate lots of carbohydrate rich foods--Has trouble keeping his A1c and CBGs under control--Sees Dr. Wolfgang Phoenix for regular medical care.  Pt mildly concerned about CBGs, however, he is dealing with lots of new medical issues (new cancer diagnosis, mets, hypercalcemia) and understands glucose control is important.  Did not have any questions for me at this time about his diabetes.    --Will follow patient during hospitalization--  Wyn Quaker RN, MSN, CDE Diabetes Coordinator Inpatient Glycemic Control Team Team Pager: [phone] (8a-5p)

## 2021-07-30 NOTE — Progress Notes (Signed)
Repeated bladder scan.430ml noted. On call NP notified. Handed off to [facility] RN at this time.

## 2021-07-30 NOTE — Progress Notes (Signed)
Pt transported to XR via bed. Alert and stable at baseline. Family at bedside.

## 2021-07-30 NOTE — Progress Notes (Signed)
Pt DTV @ 1100. Bladder scan <19ml. Pt denies urge/discomfort. IVF infusing. PO intake encouraged. MD notified. BMP pending.

## 2021-07-30 NOTE — Progress Notes (Signed)
Pt unable to void.. bladder scan performed, request orders from provider to release bladder, orders are placed by provider for in/out cath, pt bladder is relieved

## 2021-07-30 NOTE — Progress Notes (Signed)
PT Cancellation Note  Patient Details Name: MCLAIN FREER MRN: [id number] DOB: 10-29-1965   Cancelled Treatment:    Reason Eval/Treat Not Completed: Medical issues which prohibited therapy. Patient has blood sugar in 500s at this time and is not medically ready for PT evaluation. Discussed with RN. Will re-attempt tomorrow if appropriate.      Ezechiel Stooksbury 07/30/2021, 12:57 PM

## 2021-07-30 NOTE — Progress Notes (Addendum)
Attempted I&O cath x2 with no success. Catheter met with a lot of resistance. MD notified via secure chat.  Per MD, bladder scan pt again in 2 hours. MD states if urine retention persists, pt may need foley placed. Order for flomax. Updated pt and wife, both verbalize understanding to plan of care.

## 2021-07-30 NOTE — Progress Notes (Signed)
IR Update:  Have had no update on pending Ins authorization. Anticipate ultimate approval and will tentatively begin planning for procedure as discussed early next week.  Ascencion Dike PA-C Interventional Radiology 07/30/2021 4:15 PM

## 2021-07-30 NOTE — Progress Notes (Signed)
Explained to patient that we need to do one more attempt of intermittent catheter to relieve retention and if retention will continue will do foley catheter later. Patient stated " if I will have pain, better have it once because I know the result will be the same later, I rather have foley catheter now than in and out." Paged [place] NP, will proceed with coude foley insertion.

## 2021-07-30 NOTE — Anesthesia Postprocedure Evaluation (Signed)
Anesthesia Post Note  Patient: Michael Fritz  Procedure(s) Performed: TOTAL HIP HEMI ARTHROPLASTY ANTERIOR APPROACH (Left: Hip)     Patient location during evaluation: PACU Anesthesia Type: General Level of consciousness: awake and alert Pain management: pain level controlled Vital Signs Assessment: post-procedure vital signs reviewed and stable Respiratory status: spontaneous breathing, nonlabored ventilation, respiratory function stable and patient connected to nasal cannula oxygen Cardiovascular status: blood pressure returned to baseline and stable Postop Assessment: no apparent nausea or vomiting Anesthetic complications: no   No notable events documented.  Last Vitals:  Vitals:   07/09/2021 2300 07/10/2021 2340  BP:  130/73  Pulse:  81  Resp:  16  Temp: 36.4 C 36.5 C  SpO2:  97%    Last Pain:  Vitals:   07/14/2021 2327  TempSrc:   PainSc: [place]

## 2021-07-31 LAB — BASIC METABOLIC PANEL
Anion gap: 7 (ref 5–15)
BUN: 81 mg/dL — ABNORMAL HIGH (ref 6–20)
CO2: 23 mmol/L (ref 22–32)
Calcium: 9.7 mg/dL (ref 8.9–10.3)
Chloride: 103 mmol/L (ref 98–111)
Creatinine, Ser: 2.72 mg/dL — ABNORMAL HIGH (ref 0.61–1.24)
GFR, Estimated: 26 mL/min — ABNORMAL LOW (ref >60)
Glucose, Bld: 86 mg/dL (ref 70–99)
Potassium: 4.9 mmol/L (ref 3.5–5.1)
Sodium: 133 mmol/L — ABNORMAL LOW (ref 135–145)

## 2021-07-31 LAB — POTASSIUM
Potassium: 4.6 mmol/L (ref 3.5–5.1)
Potassium: 5 mmol/L (ref 3.5–5.1)
Potassium: 4.9 mmol/L (ref 3.5–5.1)

## 2021-07-31 LAB — CBC
HCT: 19.8 % — ABNORMAL LOW (ref 39.0–52.0)
Hemoglobin: 6.8 g/dL — CL (ref 13.0–17.0)
MCH: 30.1 pg (ref 26.0–34.0)
MCHC: 34.3 g/dL (ref 30.0–36.0)
MCV: 87.6 fL (ref 80.0–100.0)
Platelets: 198 10*3/uL (ref 150–400)
RBC: 2.26 MIL/uL — ABNORMAL LOW (ref 4.22–5.81)
RDW: 13.2 % (ref 11.5–15.5)
WBC: 16.1 10*3/uL — ABNORMAL HIGH (ref 4.0–10.5)
nRBC: 0 % (ref 0.0–0.2)

## 2021-07-31 LAB — GLUCOSE, CAPILLARY
Glucose-Capillary: 73 mg/dL (ref 70–99)
Glucose-Capillary: 236 mg/dL — ABNORMAL HIGH (ref 70–99)
Glucose-Capillary: 217 mg/dL — ABNORMAL HIGH (ref 70–99)
Glucose-Capillary: 250 mg/dL — ABNORMAL HIGH (ref 70–99)

## 2021-07-31 LAB — HEMOGLOBIN AND HEMATOCRIT, BLOOD
HCT: 24.7 % — ABNORMAL LOW (ref 39.0–52.0)
Hemoglobin: 8.3 g/dL — ABNORMAL LOW (ref 13.0–17.0)

## 2021-07-31 LAB — PREPARE RBC (CROSSMATCH)

## 2021-07-31 MED ORDER — CHLORHEXIDINE GLUCONATE CLOTH 2 % EX PADS
6.0000 | MEDICATED_PAD | Freq: Every day | CUTANEOUS | Status: DC
  Administered 2021-07-31 – 2021-08-01 (×2): 6 via TOPICAL

## 2021-07-31 MED ORDER — SODIUM CHLORIDE 0.9% IV SOLUTION: Freq: Once | INTRAVENOUS | Status: AC

## 2021-07-31 NOTE — Progress Notes (Signed)
OT Cancellation Note  Patient Details Name: Michael Fritz MRN: [id number] DOB: [birth year]   Cancelled Treatment:    Reason Eval/Treat Not Completed: Patient declined, no reason specified Pt recently declined PT requesting therapy re-attempt tomorrow. Will continue to follow as time allows and pt is appropriate.   [facility] OTR/L Acute Rehabilitation Services Office: [place] 07/31/2021, 12:14 PM

## 2021-07-31 NOTE — Plan of Care (Signed)
  Problem: Clinical Measurements: Goal: Will remain free from infection Outcome: Not Progressing   Problem: Nutrition: Goal: Adequate nutrition will be maintained Outcome: Not Progressing   Problem: Activity: Goal: Risk for activity intolerance will decrease Outcome: Not Progressing   Problem: Elimination: Goal: Will not experience complications related to urinary retention Outcome: Not Progressing   Problem: Elimination: Goal: Will not experience complications related to bowel motility Outcome: Not Progressing   Problem: Pain Managment: Goal: General experience of comfort will improve Outcome: Not Progressing

## 2021-07-31 NOTE — Progress Notes (Signed)
PT Cancellation Note  Patient Details Name: Michael Fritz MRN: [id number] DOB: [birth year]   Cancelled Treatment:    Reason Eval/Treat Not Completed: Patient declined, no reason specified (Pt and spouse decline therapy at this time.  State that MD this AM told him he has to have blood transfusion first and should wait until tomorrow.  PT to f/u in AM.)  Dohn Stclair A. Gyanna Jarema, PT, DPT Acute Rehabilitation Services Office: [place] 07/31/2021, 9:04 AM

## 2021-07-31 NOTE — Progress Notes (Signed)
At [phone], notified of critical hemoglobin at 6.8, paged NP Ouma, also updated her that new order for type screen is needed.

## 2021-07-31 NOTE — Progress Notes (Signed)
Subjective: 2 Days Post-Op Procedure(s) (LRB): TOTAL HIP HEMI ARTHROPLASTY ANTERIOR APPROACH (Left)  Patient reports pain as mild.  Doing well on RNF. No acute issues. He reports that he will be getting a blood transfusion today by primary team.  Objective:   VITALS:  Temp:  [97.4 F (36.3 C)-98 F (36.7 C)] 98 F (36.7 C) (05/27 0748) Pulse Rate:  [73-77] 73 (05/27 0748) Resp:  [17-18] 17 (05/27 0748) BP: (106-130)/(49-56) 130/53 (05/27 0748) SpO2:  [94 %-97 %] 97 % (05/27 0748)  Neurovascular intact Sensation intact distally Intact pulses distally Dorsiflexion/Plantar flexion intact Incision: dressing C/D/I Compartment soft   LABS Recent Labs    07/17/2021 0214 07/30/21 0440 07/31/21 0557  HGB 10.4* 8.0* 6.8*  WBC 18.9* 24.0* 16.1*  PLT 248 224 198   Recent Labs    07/30/21 1224 07/30/21 1612 07/31/21 0557 07/31/21 0926  NA 129*  --  133*  --   K 5.2*   < > 4.9 5.0  CL 103  --  103  --   CO2 16*  --  23  --   BUN 76*  --  81*  --   CREATININE 2.79*  --  2.72*  --   GLUCOSE 473*  --  86  --    < > = values in this interval not displayed.   No results for input(s): LABPT, INR in the last 72 hours.   Assessment/Plan: 2 Days Post-Op Procedure(s) (LRB): TOTAL HIP HEMI ARTHROPLASTY ANTERIOR APPROACH (Left)  WBAT LLE with a walker VTE ppx: Lovenox Follow surgical path report Following wound healing, recommend XRT to the right subtroch femur as well as left proximal femur If patient develops right hip/groin pain, he will likely require a prophylactic nail on that side Up with therapy Plan for discharge per primary team If discharge, follow up with Dr. Lyla Glassing for routine 2 week postop visit  Armond Hang 07/31/2021, 10:59 AM

## 2021-07-31 NOTE — Progress Notes (Signed)
PROGRESS NOTE    CUSTER PIMENTA  XTG:[id number] DOB: March 29, 1965 DOA: 07/13/2021 PCP: Kathyrn Drown, MD    Brief Narrative:   Michael Fritz is a 56 y.o. male with past medical history significant for essential hypertension, insulin-dependent diabetes mellitus, hyperlipidemia, tobacco abuse disorder who presented to [facility] ED by direction of his primary care office for abnormal labs.  Patient reports generalized weakness, unintentional weight loss of 12 pounds over the last several months.  Also complaining of left-sided hip pain that is worse with ambulation.  Patient denies any chest pain, no abdominal pain, no headache, no visual changes.  Day prior, patient went to see his PCP ordered blood work that showed a calcium of 12.9 and was told to come to the ED for further evaluation.  In the ED, temperature 97.7 F, HR 63, RR 17, BP 120/53, SPO2 90% on room air.  WBC 20.1, hemoglobin 10.9, platelets 269.  Sodium 132, potassium 5.3, chloride 99, CO2 25, glucose 318, BUN 28, creatinine 2.28, calcium 14.0, AST 32, ALT 38, total bilirubin 0.8.  Urinalysis unrevealing.  Chest x-ray with stable dense right upper lobe consolidation compatible with pneumonia versus mass.  CT chest/abdomen/pelvis without contrast with mass involving majority of right upper lobe with soft tissue extending along the right upper lobe and right mainstem bronchus along the carina into the AP window concerning for primary lung malignancy, widespread osseous metastatic disease with osseous lesion involving left femoral head and neck cortex which concerning for impending pathologic fracture, moderate right pleural effusion, multiple hypoechoic masses throughout the liver, fullness left adrenal gland concerning for metastases, pathologic compression fracture T5.  Hospitalist service consulted for admission for likely primary lung cancer with metastatic disease, hypercalcemia of malignancy, acute renal failure.  Assessment & Plan:    Right upper lobe mass concerning for primary lung malignancy with metastasis to liver, left adrenal, bone CT chest/abdomen/pelvis without contrast with mass involving majority of right upper lobe with soft tissue extending along the right upper lobe and right mainstem bronchus along the carina into the AP window concerning for primary lung malignancy, widespread osseous metastatic disease with osseous lesion involving left femoral head and neck cortex which concerning for impending pathologic fracture, moderate right pleural effusion, multiple hypoechoic masses throughout the liver, fullness left adrenal gland concerning for metastases, pathologic compression fracture T5. --Oncology consulted, and following peripherally for now awaiting pathology results --S/p thoracentesis 5/24, cytology shows no malignant cells --Left resected femur sent for pathology, pending --IR consulted for kyphoplasty/bone biopsy T5/7, pending insurance authorization  Impending pathologic left femur fracture s/p THA CT pelvis without contrast with focal lytic destruction involving the left femoral neck and head consistent with metastatic disease.  Patient underwent anterior total hip arthroplasty on 07/18/2021 by Dr. Lyla Glassing. --WBAT w/ walker --PT/OT evaluation --Postoperative DVT prophylaxis with Lovenox --Will need radiation therapy to right subtrochanteric region of the femur and left proximal femur --If develops right hip pain, will likely need a prophylactic cephalomedullary nail --Outpatient follow-up with orthopedics, 2 weeks Dr. Lyla Glassing  Postoperative blood loss anemia --Hgb 10.9>1.7>10.4>8.0>6.8 --Transfuse 1 unit pRBC today, repeat H&H 2 hours following transfusion --Transfuse to maintain hemoglobin greater than 7.0 --CBC daily  Pathologic T5/T7 compression fracture --IR following for T5/7 biopsy/ablation/kyphoplasty, pending insurance authorization  Hypercalcemia of malignancy Calcium 14.0 on arrival,  etiology likely secondary to diffuse malignancy from likely primary lung cancer.  Patient received IV pamidronate on 07/28/2021 with IV Decadron and calcitonin which have now been titrated off. --Ca 14.0>13.6>12.7>10.4>9.9>9.7 --  iPTH: 8, low --PTHrP: pending --BMP daily  Acute renal failure Creatinine 2.28 on admission.  Etiology likely secondary to hypercalcemia.  Uric acid level within normal limits.  No urine obstruction noted on CT abdomen/pelvis. --Cr 2.28>2.35>2.39>2.74>2.65>2.79>2.72 --Holding home metformin/ARB --Sodium bicarbonate IV 75 mL/h --Avoid nephrotoxins, renally dose all medications --BMP daily  Hyperkalemia: Resolved Treated with insulin and Lokelma. --K 5.3>>6.2>>4.6>5.0  Pleural effusion, likely malignant Underwent thoracentesis on 07/28/2021 by IR with 750 mL of fluid removed.  Cytology with no malignant cells identified, although low yield.  Hyponatremia Sodium 132 on arrival.  Suspect etiology related to lung cancer with SIADH.  Permitted by elevated glucose in the setting of IV dexamethasone use. --Na 132>133 --Continue IV fluid hydration as above --Glucose control --BMP daily  Leukocytosis Patient is afebrile, WBC count elevated 20.1.  Denies productive cough.  Low suspicion for infectious etiology and holding antibiotics for now. --20.1>> may be18.9>24.0>16.1 --CBC daily  Type 2 diabetes with hyperglycemia Hemoglobin A1c 12.2 on 07/26/2021, poorly controlled.  Likely complicated by current steroid use for hypercalcemia of malignancy as above.  Home medications include Lantus 55-55 units daily, metformin 500 mg twice daily. --Diabetic educator following, appreciate assistance --Semglee 22u Kiron BID --Resistant SSI for coverage --CBGs qAC/HS  Essential hypertension Home medication includes losartan 160 mg p.o. daily. --Holding ARB in the setting of AKI as above --Start amlodipine 10 mg p.o. daily --Hydralazine 25 mg PO q6h PRN SBP >150 or DBP  >100 --Continue monitor BP closely  Hyperlipidemia Currently not taking statin at home. --Outpatient follow-up PCP  Tobacco use disorder Counseled on need for complete cessation given new diagnosis of likely metastatic lung cancer. --Nicotine patch   DVT prophylaxis: enoxaparin (LOVENOX) injection 30 mg Start: 07/31/21 1000 SCDs Start: 07/13/2021 2345 Place and maintain sequential compression device Start: 07/18/2021 1646    Code Status: Full Code Family Communication: Updated spouse present at bedside  Disposition Plan:  Level of care: Med-Surg Status is: Inpatient Remains inpatient appropriate because: pending PT/OT evaluation, pending insurance authorization for T5/7 kyphoplasty, will eventually need radiation evaluation at [facility] long following kyphoplasty.    Consultants:  Oncology, Dr. Alen Blew Orthopedics, Dr. Lyla Glassing Interventional radiology  Procedures:  Thoracentesis 5/24 Anterior approach total hip arthroplasty, left; Dr. Lyla Glassing 07/18/2021  Antimicrobials:  None   Subjective: Patient seen examined bedside, lying in bed.  Spouse present.  Discussed trending down hemoglobin and need for transfusion today.  Continues with mild left hip discomfort.  Discussed calcium now at his normal level and still waiting insurance authorizations for kyphoplasty, hopefully will receive to have done early next week.  No other complaints or concerns at this time.  Denies headache, no visual changes, no chest pain, no palpitations, no shortness of breath, no abdominal pain, no fever/chills/night sweats, no nausea/vomiting/diarrhea, no congestion, no fatigue, no focal weakness, no paresthesias.  No acute events overnight per nursing staff.  Objective: Vitals:   07/30/21 1756 07/30/21 2031 07/31/21 0414 07/31/21 0748  BP: (!) 108/50 (!) 117/49 (!) 118/56 (!) 130/53  Pulse: 73 75 77 73  Resp: 17 17 18 17   Temp: (!) 97.4 F (36.3 C) 97.8 F (36.6 C) (!) 97.5 F (36.4 C) 98 F (36.7  C)  TempSrc: Oral  Oral Oral  SpO2: 96% 95% 97% 97%  Weight:      Height:        Intake/Output Summary (Last 24 hours) at 07/31/2021 1200 Last data filed at 07/30/2021 2230 Gross per 24 hour  Intake 360 ml  Output  375 ml  Net -15 ml   Filed Weights   07/11/2021 1032  Weight: 68 kg    Examination:  Physical Exam: GEN: NAD, alert and oriented x 3, wd/wn HEENT: NCAT, PERRL, EOMI, sclera clear, MMM PULM: CTAB w/o wheezes/crackles, normal respiratory effort, on room air CV: RRR w/o M/G/R GI: abd soft, NTND, NABS, no R/G/M MSK: no peripheral edema, noted left surgical incision sites with dressings in place, clean/dry/intact NEURO: CN II-XII intact, no focal deficits, sensation to light touch intact PSYCH: normal mood/affect Integumentary: dry/intact, no rashes or wounds    Data Reviewed: I have personally reviewed following labs and imaging studies  CBC: Recent Labs  Lab 07/18/2021 1021 07/28/21 0316 07/14/2021 0214 07/30/21 0440 07/31/21 0557  WBC 20.1* 20.2* 18.9* 24.0* 16.1*  NEUTROABS 16.8*  --   --   --   --   HGB 10.9* 10.7* 10.4* 8.0* 6.8*  HCT 33.6* 33.3* 31.2* 25.5* 19.8*  MCV 90.8 89.0 88.6 91.7 87.6  PLT 269 279 248 224 536   Basic Metabolic Panel: Recent Labs  Lab 07/28/21 0316 07/14/2021 0214 07/30/21 0440 07/30/21 0703 07/30/21 1224 07/30/21 1612 07/30/21 1952 07/31/21 0108 07/31/21 0557 07/31/21 0926  NA 133* 132*  --  133* 129*  --   --   --  133*  --   K 5.3* 5.2*  --  6.2* 5.2* 4.9 4.6 4.6 4.9 5.0  CL 103 104  --  105 103  --   --   --  103  --   CO2 23 22  --  15* 16*  --   --   --  23  --   GLUCOSE 282* 223*  --  506* 473*  --   --   --  86  --   BUN 44* 52*  --  71* 76*  --   --   --  81*  --   CREATININE 2.35* 2.39* 2.74* 2.65* 2.79*  --   --   --  2.72*  --   CALCIUM 13.6*  13.2 12.7*  --  10.4* 9.9  --   --   --  9.7  --   MG  --  1.6*  --   --   --   --   --   --   --   --    GFR: Estimated Creatinine Clearance: 28.8 mL/min (A) (by  C-G formula based on SCr of 2.72 mg/dL (H)). Liver Function Tests: Recent Labs  Lab 07/26/21 1632 07/21/2021 1021  AST 46* 32  ALT 57* 38  ALKPHOS 866* 570*  BILITOT 0.3 0.8  PROT 7.0 5.8*  ALBUMIN 3.5* 2.4*   No results for input(s): LIPASE, AMYLASE in the last 168 hours. No results for input(s): AMMONIA in the last 168 hours. Coagulation Profile: No results for input(s): INR, PROTIME in the last 168 hours. Cardiac Enzymes: Recent Labs  Lab 07/20/2021 1952  CKTOTAL 29*   BNP (last 3 results) No results for input(s): PROBNP in the last 8760 hours. HbA1C: No results for input(s): HGBA1C in the last 72 hours.  CBG: Recent Labs  Lab 07/30/21 1011 07/30/21 1135 07/30/21 1559 07/30/21 2032 07/31/21 0748  GLUCAP 517* 495* 384* 295* 73   Lipid Profile: No results for input(s): CHOL, HDL, LDLCALC, TRIG, CHOLHDL, LDLDIRECT in the last 72 hours.  Thyroid Function Tests: No results for input(s): TSH, T4TOTAL, FREET4, T3FREE, THYROIDAB in the last 72 hours. Anemia Panel: No results for input(s): VITAMINB12, FOLATE,  FERRITIN, TIBC, IRON, RETICCTPCT in the last 72 hours. Sepsis Labs: No results for input(s): PROCALCITON, LATICACIDVEN in the last 168 hours.  Recent Results (from the past 240 hour(s))  Surgical pcr screen     Status: None   Collection Time: 07/26/2021  3:23 PM   Specimen: Nasal Mucosa; Nasal Swab  Result Value Ref Range Status   MRSA, PCR NEGATIVE NEGATIVE Final   Staphylococcus aureus NEGATIVE NEGATIVE Final    Comment: (NOTE) The Xpert SA Assay (FDA approved for NASAL specimens in patients 75 years of age and older), is one component of a comprehensive surveillance program. It is not intended to diagnose infection nor to guide or monitor treatment. Performed at [facility], [place] [address] [place], [place] [address]          Radiology Studies: DG C-Arm 1-60 Min-No Report  Result Date: 07/26/2021 Fluoroscopy was utilized by the  requesting physician.  No radiographic interpretation.   DG C-Arm 1-60 Min-No Report  Result Date: 07/11/2021 Fluoroscopy was utilized by the requesting physician.  No radiographic interpretation.   DG C-Arm 1-60 Min-No Report  Result Date: 07/11/2021 Fluoroscopy was utilized by the requesting physician.  No radiographic interpretation.   DG C-Arm 1-60 Min-No Report  Result Date: 07/28/2021 Fluoroscopy was utilized by the requesting physician.  No radiographic interpretation.   DG HIP UNILAT WITH PELVIS 1V LEFT  Result Date: 07/19/2021 CLINICAL DATA:  Total hip hemiarthroplasty EXAM: DG HIP (WITH OR WITHOUT PELVIS) 1V*L* COMPARISON:  07/26/2021 FINDINGS: Changes of left hip hemiarthroplasty. Normal AP alignment. No hardware bony complicating feature. IMPRESSION: Intraoperative imaging as above.  No complicating feature. Electronically Signed   By: Rolm Baptise M.D.   On: 07/25/2021 20:18   DG HIP UNILAT WITH PELVIS 2-3 VIEWS LEFT  Result Date: 07/30/2021 CLINICAL DATA:  Left hip replacement EXAM: DG HIP (WITH OR WITHOUT PELVIS) 1V LEFT COMPARISON:  None Available. FINDINGS: Interval postsurgical changes from left total hip arthroplasty. Arthroplasty components appear in their expected alignment. No periprosthetic fracture is identified. Expected postoperative changes within the overlying soft tissues. IMPRESSION: Expected postsurgical changes of left total hip arthroplasty. Electronically Signed   By: Yetta Glassman M.D.   On: 07/30/2021 11:27        Scheduled Meds:  sodium chloride   Intravenous Once   amLODipine  10 mg Oral Daily   Chlorhexidine Gluconate Cloth  6 each Topical Daily   docusate sodium  100 mg Oral BID   enoxaparin (LOVENOX) injection  30 mg Subcutaneous Daily   feeding supplement  237 mL Oral BID BM   insulin aspart  0-20 Units Subcutaneous TID WC   insulin aspart  0-5 Units Subcutaneous QHS   insulin glargine-yfgn  22 Units Subcutaneous BID   nicotine  21 mg  Transdermal Daily   pantoprazole  40 mg Oral Daily   senna  1 tablet Oral BID   sodium zirconium cyclosilicate  10 g Oral BID   tamsulosin  0.4 mg Oral Daily   Continuous Infusions:  methocarbamol (ROBAXIN) IV Stopped (07/30/21 2050)    sodium bicarbonate (isotonic) infusion in sterile water 75 mL/hr at 07/31/21 0603     LOS: 4 days    Time spent: 51 minutes spent on chart review, discussion with nursing staff, consultants, updating family and interview/physical exam; more than 50% of that time was spent in counseling and/or coordination of care.    Rondle Lohse J [place] DO [facility] Available via Epic secure chat 7am-7pm After these  hours, please refer to coverage provider listed on [email or web address] 07/31/2021, 12:00 PM

## 2021-08-01 LAB — BASIC METABOLIC PANEL
Anion gap: 7 (ref 5–15)
BUN: 89 mg/dL — ABNORMAL HIGH (ref 6–20)
CO2: 27 mmol/L (ref 22–32)
Calcium: 9.2 mg/dL (ref 8.9–10.3)
Chloride: 98 mmol/L (ref 98–111)
Creatinine, Ser: 2.69 mg/dL — ABNORMAL HIGH (ref 0.61–1.24)
GFR, Estimated: 27 mL/min — ABNORMAL LOW (ref >60)
Glucose, Bld: 241 mg/dL — ABNORMAL HIGH (ref 70–99)
Potassium: 4.6 mmol/L (ref 3.5–5.1)
Sodium: 132 mmol/L — ABNORMAL LOW (ref 135–145)

## 2021-08-01 LAB — TYPE AND SCREEN
ABO/RH(D): A POS
Antibody Screen: NEGATIVE
Unit division: 0

## 2021-08-01 LAB — CBC
HCT: 22.5 % — ABNORMAL LOW (ref 39.0–52.0)
Hemoglobin: 7.5 g/dL — ABNORMAL LOW (ref 13.0–17.0)
MCH: 29.2 pg (ref 26.0–34.0)
MCHC: 33.3 g/dL (ref 30.0–36.0)
MCV: 87.5 fL (ref 80.0–100.0)
Platelets: 190 10*3/uL (ref 150–400)
RBC: 2.57 MIL/uL — ABNORMAL LOW (ref 4.22–5.81)
RDW: 13.1 % (ref 11.5–15.5)
WBC: 16.3 10*3/uL — ABNORMAL HIGH (ref 4.0–10.5)
nRBC: 0 % (ref 0.0–0.2)

## 2021-08-01 LAB — GLUCOSE, CAPILLARY
Glucose-Capillary: 197 mg/dL — ABNORMAL HIGH (ref 70–99)
Glucose-Capillary: 164 mg/dL — ABNORMAL HIGH (ref 70–99)
Glucose-Capillary: 226 mg/dL — ABNORMAL HIGH (ref 70–99)
Glucose-Capillary: 171 mg/dL — ABNORMAL HIGH (ref 70–99)

## 2021-08-01 LAB — BPAM RBC
Blood Product Expiration Date: 202306112359
ISSUE DATE / TIME: 202305271201
Unit Type and Rh: 6200

## 2021-08-01 MED ORDER — SENNOSIDES-DOCUSATE SODIUM 8.6-50 MG PO TABS
2.0000 | ORAL_TABLET | Freq: Two times a day (BID) | ORAL | Status: DC
  Administered 2021-08-01 (×2): 2 via ORAL
  Filled 2021-08-01 (×3): qty 2

## 2021-08-01 MED ORDER — POLYETHYLENE GLYCOL 3350 17 G PO PACK
17.0000 g | PACK | Freq: Two times a day (BID) | ORAL | Status: DC
  Administered 2021-08-01 (×2): 17 g via ORAL
  Filled 2021-08-01 (×2): qty 1

## 2021-08-01 MED ORDER — GUAIFENESIN-DM 100-10 MG/5ML PO SYRP
5.0000 mL | ORAL_SOLUTION | ORAL | Status: DC | PRN
  Administered 2021-08-01 – 2021-08-02 (×4): 5 mL via ORAL
  Filled 2021-08-01 (×4): qty 5

## 2021-08-01 MED ORDER — BISACODYL 10 MG RE SUPP: 10.0000 mg | Freq: Every day | RECTAL | Status: DC | PRN

## 2021-08-01 MED ORDER — BENZONATATE 100 MG PO CAPS
100.0000 mg | ORAL_CAPSULE | Freq: Three times a day (TID) | ORAL | Status: DC | PRN
  Administered 2021-08-01 (×3): 100 mg via ORAL
  Filled 2021-08-01 (×3): qty 1

## 2021-08-01 MED ORDER — LIDOCAINE 5 % EX PTCH
1.0000 | MEDICATED_PATCH | CUTANEOUS | Status: DC
  Administered 2021-08-01: 1 via TRANSDERMAL
  Filled 2021-08-01: qty 1

## 2021-08-01 NOTE — Progress Notes (Signed)
PROGRESS NOTE    Michael Fritz  QAS:[id number] DOB: [birth year] DOA: 07/06/2021 PCP: Kathyrn Drown, MD    Brief Narrative:   Michael Fritz is a 56 y.o. male with past medical history significant for essential hypertension, insulin-dependent diabetes mellitus, hyperlipidemia, tobacco abuse disorder who presented to [facility] ED by direction of his primary care office for abnormal labs.  Patient reports generalized weakness, unintentional weight loss of 12 pounds over the last several months.  Also complaining of left-sided hip pain that is worse with ambulation.  Patient denies any chest pain, no abdominal pain, no headache, no visual changes.  Day prior, patient went to see his PCP ordered blood work that showed a calcium of 12.9 and was told to come to the ED for further evaluation.  In the ED, temperature 97.7 F, HR 63, RR 17, BP 120/53, SPO2 90% on room air.  WBC 20.1, hemoglobin 10.9, platelets 269.  Sodium 132, potassium 5.3, chloride 99, CO2 25, glucose 318, BUN 28, creatinine 2.28, calcium 14.0, AST 32, ALT 38, total bilirubin 0.8.  Urinalysis unrevealing.  Chest x-ray with stable dense right upper lobe consolidation compatible with pneumonia versus mass.  CT chest/abdomen/pelvis without contrast with mass involving majority of right upper lobe with soft tissue extending along the right upper lobe and right mainstem bronchus along the carina into the AP window concerning for primary lung malignancy, widespread osseous metastatic disease with osseous lesion involving left femoral head and neck cortex which concerning for impending pathologic fracture, moderate right pleural effusion, multiple hypoechoic masses throughout the liver, fullness left adrenal gland concerning for metastases, pathologic compression fracture T5.  Hospitalist service consulted for admission for likely primary lung cancer with metastatic disease, hypercalcemia of malignancy, acute renal failure.  Assessment & Plan:    Right upper lobe mass concerning for primary lung malignancy with metastasis to liver, left adrenal, bone CT chest/abdomen/pelvis without contrast with mass involving majority of right upper lobe with soft tissue extending along the right upper lobe and right mainstem bronchus along the carina into the AP window concerning for primary lung malignancy, widespread osseous metastatic disease with osseous lesion involving left femoral head and neck cortex which concerning for impending pathologic fracture, moderate right pleural effusion, multiple hypoechoic masses throughout the liver, fullness left adrenal gland concerning for metastases, pathologic compression fracture T5. --Oncology consulted, and following peripherally for now awaiting pathology results --S/p thoracentesis 5/24, cytology shows no malignant cells --Left resected femur sent for pathology, pending --IR consulted for kyphoplasty/bone biopsy T5/7, pending insurance authorization  Impending pathologic left femur fracture s/p THA CT pelvis without contrast with focal lytic destruction involving the left femoral neck and head consistent with metastatic disease.  Patient underwent anterior total hip arthroplasty on 07/28/2021 by Dr. Lyla Glassing. --WBAT w/ walker --Postoperative DVT prophylaxis with Lovenox --Will need radiation therapy to right subtrochanteric region of the femur and left proximal femur --Continue PT/OT while inpatient --If develops right hip pain, will likely need a prophylactic cephalomedullary nail --Outpatient follow-up with orthopedics, 2 weeks Dr. Lyla Glassing  Postoperative blood loss anemia --Hgb 10.9>1.7>10.4>8.0>6.8>8.3>7.5 --Transfuse 1 unit pRBC today, repeat H&H 2 hours following transfusion --Transfuse to maintain hemoglobin > 7.0 --CBC daily  Pathologic T5/T7 compression fracture --IR following for T5/7 biopsy/ablation/kyphoplasty, pending insurance authorization --N.p.o. after midnight for possible  procedure tomorrow  Hypercalcemia of malignancy Calcium 14.0 on arrival, etiology likely secondary to diffuse malignancy from likely primary lung cancer.  Patient received IV pamidronate on 07/28/2021 with IV Decadron and calcitonin  which have now been titrated off. --Ca 14.0>13.6>12.7>10.4>9.9>9.7>9.2 --iPTH: 8, low --PTHrP: pending --BMP daily  Acute renal failure Creatinine 2.28 on admission.  Etiology likely secondary to hypercalcemia.  Uric acid level within normal limits.  No urine obstruction noted on CT abdomen/pelvis. --Cr 2.28>2.35>2.39>2.74>2.65>2.79>2.72>2.69 --Holding home metformin/ARB --Avoid nephrotoxins, renally dose all medications --BMP daily  Hyperkalemia: Resolved Treated with insulin and Lokelma. --K 5.3>>6.2>>4.6>5.0>4.6  Pleural effusion, likely malignant Underwent thoracentesis on 07/28/2021 by IR with 750 mL of fluid removed.  Cytology with no malignant cells identified, although low yield.  Hyponatremia Sodium 132 on arrival.  Suspect etiology related to lung cancer with SIADH.  Permitted by elevated glucose in the setting of IV dexamethasone use. --Na 188>416>606 --Glucose control --BMP daily  Leukocytosis Patient is afebrile, WBC count elevated 20.1.  Denies productive cough.  Low suspicion for infectious etiology and holding antibiotics for now. --20.1>> 18.9>24.0>16.1>16.3 --CBC daily  Type 2 diabetes with hyperglycemia Hemoglobin A1c 12.2 on 07/26/2021, poorly controlled.  Likely complicated by current steroid use for hypercalcemia of malignancy as above.  Home medications include Lantus 55-55 units daily, metformin 500 mg twice daily. --Diabetic educator following, appreciate assistance --Semglee 22u [place] BID --Resistant SSI for coverage --CBGs qAC/HS  Essential hypertension Home medication includes losartan 160 mg p.o. daily. --Holding ARB in the setting of AKI as above --Start amlodipine 10 mg p.o. daily --Hydralazine 25 mg PO q6h PRN SBP  >150 or DBP >100 --Continue monitor BP closely  Hyperlipidemia Currently not taking statin at home. --Outpatient follow-up PCP  Tobacco use disorder Counseled on need for complete cessation given new diagnosis of likely metastatic lung cancer. --Nicotine patch   DVT prophylaxis: enoxaparin (LOVENOX) injection 30 mg Start: 07/31/21 1000 SCDs Start: 07/26/2021 2345 Place and maintain sequential compression device Start: 07/31/2021 1646    Code Status: Full Code Family Communication: Updated spouse present at bedside  Disposition Plan:  Level of care: Med-Surg Status is: Inpatient Remains inpatient appropriate because: pending OT evaluation, pending insurance authorization for T5/7 kyphoplasty, will eventually need radiation evaluation following kyphoplasty.    Consultants:  Oncology, Dr. Alen Blew Orthopedics, Dr. Lyla Glassing Interventional radiology  Procedures:  Thoracentesis 5/24 Anterior approach total hip arthroplasty, left; Dr. Lyla Glassing 08/01/2021  Antimicrobials:  None   Subjective: Patient seen examined bedside, lying in bed.  Spouse present.  Complaining of cough overnight.  No other complaints or concerns at this time.  Discussed with patient that we will make n.p.o. after midnight for possible IR kyphoplasty tomorrow if insurance authorization comes through.  Denies headache, no visual changes, no chest pain, no palpitations, no shortness of breath, no abdominal pain, no fever/chills/night sweats, no nausea/vomiting/diarrhea, no congestion, no fatigue, no focal weakness, no paresthesias.  No acute events overnight per nursing staff.  Objective: Vitals:   07/31/21 1641 07/31/21 1956 08/01/21 0522 08/01/21 0831  BP: (!) 112/49 (!) 157/52 (!) 163/60 (!) 152/57  Pulse: 87 92 95 89  Resp: 17 16 18 17   Temp: 98.3 F (36.8 C) 98.2 F (36.8 C) 98.7 F (37.1 C) 99.1 F (37.3 C)  TempSrc: Oral Oral Oral Oral  SpO2: 93% 95% 91% 91%  Weight:      Height:         Intake/Output Summary (Last 24 hours) at 08/01/2021 1205 Last data filed at 08/01/2021 0606 Gross per 24 hour  Intake 3526.16 ml  Output 750 ml  Net 2776.16 ml   Filed Weights   07/07/2021 1032  Weight: 68 kg    Examination:  Physical  Exam: GEN: NAD, alert and oriented x 3, wd/wn HEENT: NCAT, PERRL, EOMI, sclera clear, MMM PULM: CTAB w/o wheezes/crackles, normal respiratory effort, on room air CV: RRR w/o M/G/R GI: abd soft, NTND, NABS, no R/G/M MSK: no peripheral edema, noted left surgical incision sites with dressings in place, clean/dry/intact NEURO: CN II-XII intact, no focal deficits, sensation to light touch intact PSYCH: normal mood/affect Integumentary: dry/intact, no rashes or wounds    Data Reviewed: I have personally reviewed following labs and imaging studies  CBC: Recent Labs  Lab 07/22/2021 1021 07/28/21 0316 07/17/2021 0214 07/30/21 0440 07/31/21 0557 07/31/21 1708 08/01/21 0012  WBC 20.1* 20.2* 18.9* 24.0* 16.1*  --  16.3*  NEUTROABS 16.8*  --   --   --   --   --   --   HGB 10.9* 10.7* 10.4* 8.0* 6.8* 8.3* 7.5*  HCT 33.6* 33.3* 31.2* 25.5* 19.8* 24.7* 22.5*  MCV 90.8 89.0 88.6 91.7 87.6  --  87.5  PLT 269 279 248 224 198  --  962   Basic Metabolic Panel: Recent Labs  Lab 07/26/2021 0214 07/30/21 0440 07/30/21 0703 07/30/21 1224 07/30/21 1612 07/31/21 0108 07/31/21 0557 07/31/21 0926 07/31/21 1131 08/01/21 0012  NA 132*  --  133* 129*  --   --  133*  --   --  132*  K 5.2*  --  6.2* 5.2*   < > 4.6 4.9 5.0 4.9 4.6  CL 104  --  105 103  --   --  103  --   --  98  CO2 22  --  15* 16*  --   --  23  --   --  27  GLUCOSE 223*  --  506* 473*  --   --  86  --   --  241*  BUN 52*  --  71* 76*  --   --  81*  --   --  89*  CREATININE 2.39* 2.74* 2.65* 2.79*  --   --  2.72*  --   --  2.69*  CALCIUM 12.7*  --  10.4* 9.9  --   --  9.7  --   --  9.2  MG 1.6*  --   --   --   --   --   --   --   --   --    < > = values in this interval not displayed.    GFR: Estimated Creatinine Clearance: 29.1 mL/min (A) (by C-G formula based on SCr of 2.69 mg/dL (H)). Liver Function Tests: Recent Labs  Lab 07/26/21 1632 07/31/2021 1021  AST 46* 32  ALT 57* 38  ALKPHOS 866* 570*  BILITOT 0.3 0.8  PROT 7.0 5.8*  ALBUMIN 3.5* 2.4*   No results for input(s): LIPASE, AMYLASE in the last 168 hours. No results for input(s): AMMONIA in the last 168 hours. Coagulation Profile: No results for input(s): INR, PROTIME in the last 168 hours. Cardiac Enzymes: Recent Labs  Lab 07/26/2021 1952  CKTOTAL 29*   BNP (last 3 results) No results for input(s): PROBNP in the last 8760 hours. HbA1C: No results for input(s): HGBA1C in the last 72 hours.  CBG: Recent Labs  Lab 07/31/21 0748 07/31/21 1209 07/31/21 1642 07/31/21 1957 08/01/21 0832  GLUCAP 73 236* 217* 250* 197*   Lipid Profile: No results for input(s): CHOL, HDL, LDLCALC, TRIG, CHOLHDL, LDLDIRECT in the last 72 hours.  Thyroid Function Tests: No results for input(s): TSH, T4TOTAL, FREET4, T3FREE,  THYROIDAB in the last 72 hours. Anemia Panel: No results for input(s): VITAMINB12, FOLATE, FERRITIN, TIBC, IRON, RETICCTPCT in the last 72 hours. Sepsis Labs: No results for input(s): PROCALCITON, LATICACIDVEN in the last 168 hours.  Recent Results (from the past 240 hour(s))  Surgical pcr screen     Status: None   Collection Time: 07/08/2021  3:23 PM   Specimen: Nasal Mucosa; Nasal Swab  Result Value Ref Range Status   MRSA, PCR NEGATIVE NEGATIVE Final   Staphylococcus aureus NEGATIVE NEGATIVE Final    Comment: (NOTE) The Xpert SA Assay (FDA approved for NASAL specimens in patients 16 years of age and older), is one component of a comprehensive surveillance program. It is not intended to diagnose infection nor to guide or monitor treatment. Performed at [facility], [place] [address] [place],  [address]          Radiology Studies: No results  found.      Scheduled Meds:  amLODipine  10 mg Oral Daily   Chlorhexidine Gluconate Cloth  6 each Topical Daily   enoxaparin (LOVENOX) injection  30 mg Subcutaneous Daily   feeding supplement  237 mL Oral BID BM   insulin aspart  0-20 Units Subcutaneous TID WC   insulin aspart  0-5 Units Subcutaneous QHS   insulin glargine-yfgn  22 Units Subcutaneous BID   lidocaine  1 patch Transdermal Q24H   nicotine  21 mg Transdermal Daily   pantoprazole  40 mg Oral Daily   polyethylene glycol  17 g Oral BID   senna-docusate  2 tablet Oral BID   tamsulosin  0.4 mg Oral Daily   Continuous Infusions:  methocarbamol (ROBAXIN) IV Stopped (07/30/21 2050)     LOS: 5 days    Time spent: 49 minutes spent on chart review, discussion with nursing staff, consultants, updating family and interview/physical exam; more than 50% of that time was spent in counseling and/or coordination of care.    Ruthanna Macchia J [place] DO [facility] Available via Epic secure chat 7am-7pm After these hours, please refer to coverage provider listed on [email or web address] 08/01/2021, 12:05 PM

## 2021-08-01 NOTE — Evaluation (Signed)
Physical Therapy Evaluation Patient Details Name: Michael Fritz MRN: [id number] DOB: 11/03/1965 Today's Date: 08/01/2021  History of Present Illness  Pt is 56 yr old M admitted on 07/11/2021 for abnormal labs, fatigue and widespread body pain x 1 month.  Imaging (+) for mass involving R upper lung lobe and R mainstem bronchus; widespread osseous met disease including L femoral head/neck and R subtrochanteric region; liver masses; adrenal met disease; compression fx T5. Underwent Left hip hemiarthroplasty, anterior approach on 5/25. PMH: adhesive capsulitis R shoulder, DM, HTN   Clinical Impression  Pt presents with condition above and deficits mentioned below, see PT Problem List. PTA, he was IND without DME, living with his spouse and adult son in a 1-level house with 3 STE. Currently, pt is primarily limited by his L hip pain. Pt is denying any back pain at this time. He required minA for bed mobility and transfers to stand, but was able to take a few small, slow steps with a RW at a min guard assist level to go from bed > recliner. I suspect as his pain is better managed he will progress well. Thus, anticipate pt would be able to go home with HHPT if recommended by the physician. Will continue to follow acutely.     Recommendations for follow up therapy are one component of a multi-disciplinary discharge planning process, led by the attending physician.  Recommendations may be updated based on patient status, additional functional criteria and insurance authorization.  Follow Up Recommendations Follow physician's recommendations for discharge plan and follow up therapies (could do HHPT if desired by physician)    Assistance Recommended at Discharge Intermittent Supervision/Assistance  Patient can return home with the following  A little help with walking and/or transfers;A little help with bathing/dressing/bathroom;Assistance with cooking/housework;Assist for transportation;Help with stairs or  ramp for entrance    Equipment Recommendations Rolling walker (2 wheels);BSC/3in1  Recommendations for Other Services       Functional Status Assessment Patient has had a recent decline in their functional status and demonstrates the ability to make significant improvements in function in a reasonable and predictable amount of time.     Precautions / Restrictions Precautions Precautions: Fall;Back Precaution Booklet Issued: No Precaution Comments: reviewed spinal precautions Restrictions Weight Bearing Restrictions: Yes LLE Weight Bearing: Weight bearing as tolerated Other Position/Activity Restrictions:  (no brace needed for T5 compression fx unless back pain limiting pt per conversation with Dr. [place] may change after potential back surgery)      Mobility  Bed Mobility Overal bed mobility: Needs Assistance Bed Mobility: Rolling, Sidelying to Sit Rolling: Min assist Sidelying to sit: Min assist, HOB elevated       General bed mobility comments: Cues and assistance to slightly flex L knee and reach UE across to R bed rail to log roll, minA for L leg and hips. MinA to manage legs off EOB, cuing pt to push up with UEs to ascend trunk, HOB elevated    Transfers Overall transfer level: Needs assistance Equipment used: Rolling walker (2 wheels) Transfers: Sit to/from Stand, Bed to chair/wheelchair/BSC Sit to Stand: Min assist, From elevated surface   Step pivot transfers: Min guard       General transfer comment: MinA to power up to stand from elevated EOB with pt placing L foot anterior to R for sit <> stand, cues to push up with R UE from bed to stand. Min guard assist for safety with stand step to R bed > recliner using RW.  Pt displaying L knee instability but no significant buckling.    Ambulation/Gait Ambulation/Gait assistance: Min guard Gait Distance (Feet): 3 Feet Assistive device: Rolling walker (2 wheels) Gait Pattern/deviations: Step-to pattern, Decreased  stance time - left, Decreased step length - right, Decreased step length - left, Decreased stride length, Knees buckling, Shuffle, Trunk flexed, Antalgic (partial buckling) Gait velocity: reduced Gait velocity interpretation: <1.31 ft/sec, indicative of household ambulator   General Gait Details: Pt with very slow, antalgic gait pattern to step to R bed > recliner with RW. Pt minimally if at all clearing feet and displaying L knee instability and partial buckling but no full buckling. Min guard for Scientist, research (medical)    Modified Rankin (Stroke Patients Only)       Balance Overall balance assessment: Needs assistance Sitting-balance support: Single extremity supported, Bilateral upper extremity supported, Feet supported Sitting balance-Leahy Scale: Poor Sitting balance - Comments: Reliant on UE support for pain management sitting EOB, supervision for safety   Standing balance support: Bilateral upper extremity supported, During functional activity, Reliant on assistive device for balance Standing balance-Leahy Scale: Poor Standing balance comment: Reliant on RW                             Pertinent Vitals/Pain Pain Assessment Pain Assessment: 0-10 Pain Score: 7  Pain Location: L hip Pain Descriptors / Indicators: Sharp, Operative site guarding Pain Intervention(s): Limited activity within patient's tolerance, Monitored during session, Repositioned, Patient requesting pain meds-RN notified, RN gave pain meds during session    Home Living Family/patient expects to be discharged to:: Private residence Living Arrangements: Children;Spouse/significant other (57 y.o. son) Available Help at Discharge: Family;Available 24 hours/day (mother can also assist) Type of Home: House Home Access: Stairs to enter Entrance Stairs-Rails: Psychiatric nurse of Steps: 3   Home Layout: One level Home Equipment: Shower seat - built  in;Grab bars - tub/shower      Prior Function Prior Level of Function : Driving;Independent/Modified Independent;Working/employed             Mobility Comments: No AD. No falls. 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     Hand Dominance   Dominant Hand: Right    Extremity/Trunk Assessment   Upper Extremity Assessment Upper Extremity Assessment: Defer to OT evaluation    Lower Extremity Assessment Lower Extremity Assessment: LLE deficits/detail LLE Deficits / Details: Pain limiting strength and hip and knee ROM    Cervical / Trunk Assessment Cervical / Trunk Assessment: Other exceptions Cervical / Trunk Exceptions: compression fx T5  Communication   Communication: No difficulties  Cognition Arousal/Alertness: Awake/alert Behavior During Therapy: WFL for tasks assessed/performed Overall Cognitive Status: Within Functional Limits for tasks assessed                                          General Comments      Exercises     Assessment/Plan    PT Assessment Patient needs continued PT services  PT Problem List Decreased strength;Decreased range of motion;Decreased activity tolerance;Decreased balance;Decreased mobility;Pain       PT Treatment Interventions DME instruction;Gait training;Stair training;Functional mobility training;Therapeutic activities;Therapeutic exercise;Balance training;Neuromuscular re-education;Patient/family education    PT Goals (Current goals can be found in the Care Plan section)  Acute Rehab  PT Goals Patient Stated Goal: to get back to his normal PT Goal Formulation: With patient/family Time For Goal Achievement: 08/08/21 Potential to Achieve Goals: Good    Frequency 7X/week (BID)     Co-evaluation               AM-PAC PT "6 Clicks" Mobility  Outcome Measure Help needed turning from your back to your side while in a flat bed without using bedrails?: A Little Help needed moving from  lying on your back to sitting on the side of a flat bed without using bedrails?: A Little Help needed moving to and from a bed to a chair (including a wheelchair)?: A Little Help needed standing up from a chair using your arms (e.g., wheelchair or bedside chair)?: A Little Help needed to walk in hospital room?: Total Help needed climbing 3-5 steps with a railing? : Total 6 Click Score: 14    End of Session Equipment Utilized During Treatment: Gait belt Activity Tolerance: Patient limited by pain Patient left: in chair;with call bell/phone within reach;with family/visitor present Nurse Communication: Mobility status;Patient requests pain meds;Other (comment) (wrist band too tight on R) PT Visit Diagnosis: Unsteadiness on feet (R26.81);Other abnormalities of gait and mobility (R26.89);Muscle weakness (generalized) (M62.81);Difficulty in walking, not elsewhere classified (R26.2);Pain Pain - Right/Left: Left Pain - part of body: Hip    Time: 3009-2330 PT Time Calculation (min) (ACUTE ONLY): 33 min   Charges:   PT Evaluation $PT Eval Moderate Complexity: 1 Mod PT Treatments $Therapeutic Activity: 8-22 mins        Moishe Spice, PT, DPT Acute Rehabilitation Services  Pager: [phone] Office: [phone]   Orvan Falconer 08/01/2021, 10:50 AM

## 2021-08-01 NOTE — Progress Notes (Signed)
SADIEL MOTA  MRN: [id number] DOB/Age: 06-01-1965 56 y.o. [facility] Procedure: Procedure(s) (LRB): TOTAL HIP HEMI ARTHROPLASTY ANTERIOR APPROACH (Left)     Subjective: C/O left hip pain, currently working with therapy to get out of bed for first time.   Vital Signs Temp:  [97.9 F (36.6 C)-99.1 F (37.3 C)] 99.1 F (37.3 C) (05/28 0831) Pulse Rate:  [84-95] 89 (05/28 0831) Resp:  [16-18] 17 (05/28 0831) BP: (112-163)/(49-60) 152/57 (05/28 0831) SpO2:  [91 %-99 %] 91 % (05/28 0831)  Lab Results Recent Labs    07/31/21 0557 07/31/21 1708 08/01/21 0012  WBC 16.1*  --  16.3*  HGB 6.8* 8.3* 7.5*  HCT 19.8* 24.7* 22.5*  PLT 198  --  190   BMET Recent Labs    07/31/21 0557 07/31/21 0926 07/31/21 1131 08/01/21 0012  NA 133*  --   --  132*  K 4.9   < > 4.9 4.6  CL 103  --   --  98  CO2 23  --   --  27  GLUCOSE 86  --   --  241*  BUN 81*  --   --  89*  CREATININE 2.72*  --   --  2.69*  CALCIUM 9.7  --   --  9.2   < > = values in this interval not displayed.   No results found for: INR   Exam Left hip aquacel dressings dry and thigh dressing dry NVI LLE        Plan Continue to mobilize  Continue plans as outlined in previous note,   WBAT LLE with a walker VTE ppx: Lovenox Follow surgical path report Following wound healing, recommend XRT to the right subtroch femur as well as left proximal femur If patient develops right hip/groin pain, he will likely require a prophylactic nail on that side Up with therapy Plan for discharge per primary team If discharge, follow up with Dr. Lyla Glassing for routine 2 week postop visit  Jenetta Loges PA-C  08/01/2021, 10:10 AM Contact # [phone]

## 2021-08-01 NOTE — Progress Notes (Signed)
Physical Therapy Treatment Patient Details Name: Michael Fritz MRN: [id number] DOB: 01-24-1966 Today's Date: 08/01/2021   History of Present Illness Pt is 56 yr old M admitted on 07/14/2021 for abnormal labs, fatigue and widespread body pain x 1 month.  Imaging (+) for mass involving R upper lung lobe and R mainstem bronchus; widespread osseous met disease including L femoral head/neck and R subtrochanteric region; liver masses; adrenal met disease; compression fx T5. Underwent Left hip hemiarthroplasty, anterior approach on 5/25. PMH: adhesive capsulitis R shoulder, DM, HTN    PT Comments    Pt is making good, steady progress with mobility. He was able to come to stand with improved ease and very light minA from a slightly decreased elevated EOB. He was also able to ambulate ~5 ft anteriorly then ~5 ft posteriorly with a RW and min guard assist using the RW with improved, but still short step length. Pt limited by further mobility and declining exercises due to L hip pain. Educated pt on bed level exercises to improve his L leg strength and ROM. Will continue to follow acutely. Current recommendations remain appropriate.   Recommendations for follow up therapy are one component of a multi-disciplinary discharge planning process, led by the attending physician.  Recommendations may be updated based on patient status, additional functional criteria and insurance authorization.  Follow Up Recommendations  Follow physician's recommendations for discharge plan and follow up therapies (could do HHPT if desired by physician)     Assistance Recommended at Discharge Intermittent Supervision/Assistance  Patient can return home with the following A little help with walking and/or transfers;A little help with bathing/dressing/bathroom;Assistance with cooking/housework;Assist for transportation;Help with stairs or ramp for entrance   Equipment Recommendations  Rolling walker (2 wheels);BSC/3in1     Recommendations for Other Services       Precautions / Restrictions Precautions Precautions: Fall;Back Precaution Booklet Issued: No Precaution Comments: reviewed spinal precautions Restrictions Weight Bearing Restrictions: Yes LLE Weight Bearing: Weight bearing as tolerated Other Position/Activity Restrictions:  (no brace needed for T5 compression fx unless back pain limiting pt per conversation with Dr. [place] may change after potential back surgery)     Mobility  Bed Mobility Overal bed mobility: Needs Assistance Bed Mobility: Rolling, Sidelying to Sit, Sit to Supine Rolling: Min assist Sidelying to sit: Min assist, HOB elevated   Sit to supine: Min assist, HOB elevated   General bed mobility comments: Cues and assistance at L leg and hip to roll to R then transition legs off EOB to sit up, HOB elevated. MinA to bring legs up onto bed with return to supine, HOB elevated. Cues provided to hook R foot under L for self assisting L leg.    Transfers Overall transfer level: Needs assistance Equipment used: Rolling walker (2 wheels) Transfers: Sit to/from Stand Sit to Stand: Min assist, From elevated surface   Step pivot transfers: Min guard       General transfer comment: EOB elevated a few inches with pt needing very light minA to power up to stand to RW with L foot placed anterior to R for pain management.    Ambulation/Gait Ambulation/Gait assistance: Min guard Gait Distance (Feet): 10 Feet Assistive device: Rolling walker (2 wheels) Gait Pattern/deviations: Step-to pattern, Decreased stance time - left, Decreased step length - right, Decreased step length - left, Decreased stride length, Knees buckling, Shuffle, Trunk flexed, Antalgic (partial buckling) Gait velocity: reduced Gait velocity interpretation: <1.31 ft/sec, indicative of household ambulator   General Gait Details: Pt  with very slow, antalgic gait pattern to step anterior <> posterior at EOB with RW. Pt  with poor feet clearance and displaying L knee instability and partial buckling but no full buckling. Cues provided to activate L quads during L stance phase. Min guard for Barrister's clerk    Modified Rankin (Stroke Patients Only)       Balance Overall balance assessment: Needs assistance Sitting-balance support: Single extremity supported, Bilateral upper extremity supported, Feet supported Sitting balance-Leahy Scale: Poor Sitting balance - Comments: Reliant on UE support for pain management sitting EOB, supervision for safety   Standing balance support: Bilateral upper extremity supported, During functional activity, Reliant on assistive device for balance Standing balance-Leahy Scale: Poor Standing balance comment: Reliant on RW                            Cognition Arousal/Alertness: Awake/alert Behavior During Therapy: WFL for tasks assessed/performed Overall Cognitive Status: Within Functional Limits for tasks assessed                                          Exercises      General Comments General comments (skin integrity, edema, etc.): educated pt to perform heel slides, quad sets, and hip abd/add supine in bed as HEP      Pertinent Vitals/Pain Pain Assessment Pain Assessment: Faces Pain Score: 7  Faces Pain Scale: Hurts even more Pain Location: L hip Pain Descriptors / Indicators: Sharp, Operative site guarding Pain Intervention(s): Monitored during session, Limited activity within patient's tolerance, Repositioned    Home Living Family/patient expects to be discharged to:: Private residence Living Arrangements: Children;Spouse/significant other (58 y/o son) Available Help at Discharge: Family;Available 24 hours/day Type of Home: House Home Access: Stairs to enter Entrance Stairs-Rails: Psychiatric nurse of Steps: 3   Home Layout: One level Home Equipment: Shower seat -  built in;Grab bars - tub/shower Additional Comments: spouse works daytime hours, but mother can assist as well as needed    Prior Function            PT Goals (current goals can now be found in the care plan section) Acute Rehab PT Goals Patient Stated Goal: to get some rest PT Goal Formulation: With patient Time For Goal Achievement: 08/08/21 Potential to Achieve Goals: Good Progress towards PT goals: Progressing toward goals    Frequency    7X/week (BID)      PT Plan Current plan remains appropriate    Co-evaluation              AM-PAC PT "6 Clicks" Mobility   Outcome Measure  Help needed turning from your back to your side while in a flat bed without using bedrails?: A Little Help needed moving from lying on your back to sitting on the side of a flat bed without using bedrails?: A Little Help needed moving to and from a bed to a chair (including a wheelchair)?: A Little Help needed standing up from a chair using your arms (e.g., wheelchair or bedside chair)?: A Little Help needed to walk in hospital room?: Total Help needed climbing 3-5 steps with a railing? : Total 6 Click Score: 14    End of Session Equipment Utilized During Treatment: Gait belt Activity Tolerance: Patient  limited by pain Patient left: with call bell/phone within reach;in bed;with bed alarm set   PT Visit Diagnosis: Unsteadiness on feet (R26.81);Other abnormalities of gait and mobility (R26.89);Muscle weakness (generalized) (M62.81);Difficulty in walking, not elsewhere classified (R26.2);Pain Pain - Right/Left: Left Pain - part of body: Hip     Time: 9735-3299 PT Time Calculation (min) (ACUTE ONLY): 14 min  Charges:  $Therapeutic Activity: 8-22 mins                     Moishe Spice, PT, DPT Acute Rehabilitation Services  Pager: [phone] Office: [phone]    Orvan Falconer 08/01/2021, 4:05 PM

## 2021-08-01 NOTE — Evaluation (Signed)
Occupational Therapy Evaluation Patient Details Name: Michael Fritz MRN: [id number] DOB: Nov 11, 1965 Today's Date: 08/01/2021   History of Present Illness Pt is 56 yr old M admitted on 07/23/2021 for abnormal labs, fatigue and widespread body pain x 1 month.  Imaging (+) for mass involving R upper lung lobe and R mainstem bronchus; widespread osseous met disease including L femoral head/neck and R subtrochanteric region; liver masses; adrenal met disease; compression fx T5. Underwent Left hip hemiarthroplasty, anterior approach on 5/25. PMH: adhesive capsulitis R shoulder, DM, HTN   Clinical Impression   PTA patient independent and working. Admitted for above and presents with problem list below, including L hip pain, generalized weakness, decreased activity tolerance and impaired balance. He demonstrates ability to complete ADLs with setup to max assist (for LB), transfers with min assist using RW. Educated on safety, ADL compensatory techniques, recommendations, DME/AE, and precautions.  Anticipate patient will progress well, will follow acutely.  DC plan per physician, but may benefit from [facility] services.     Recommendations for follow up therapy are one component of a multi-disciplinary discharge planning process, led by the attending physician.  Recommendations may be updated based on patient status, additional functional criteria and insurance authorization.   Follow Up Recommendations  Follow physician's recommendations for discharge plan and follow up therapies    Assistance Recommended at Discharge Intermittent Supervision/Assistance  Patient can return home with the following A little help with walking and/or transfers;A lot of help with bathing/dressing/bathroom;Assistance with cooking/housework;Assist for transportation;Help with stairs or ramp for entrance    Functional Status Assessment  Patient has had a recent decline in their functional status and demonstrates the ability to  make significant improvements in function in a reasonable and predictable amount of time.  Equipment Recommendations  BSC/3in1;Other (comment) (RW)    Recommendations for Other Services       Precautions / Restrictions Precautions Precautions: Fall;Back Precaution Booklet Issued: No Precaution Comments: reviewed spinal precautions Restrictions Weight Bearing Restrictions: Yes LLE Weight Bearing: Weight bearing as tolerated Other Position/Activity Restrictions:  (no brace needed for T5 compression fx unless back pain limiting pt per conversation with Dr. [place] may change after potential back surgery)      Mobility Bed Mobility Overal bed mobility: Needs Assistance Bed Mobility: Rolling, Sit to Sidelying Rolling: Min assist       Sit to sidelying: Min assist General bed mobility comments: cueing for technique and assist to bring L LE to sidelying from EOB    Transfers                          Balance Overall balance assessment: Needs assistance Sitting-balance support: No upper extremity supported, Feet supported Sitting balance-Leahy Scale: Fair     Standing balance support: Bilateral upper extremity supported, During functional activity Standing balance-Leahy Scale: Poor Standing balance comment: Reliant on RW                           ADL either performed or assessed with clinical judgement   ADL Overall ADL's : Needs assistance/impaired     Grooming: Set up;Sitting           Upper Body Dressing : Set up;Sitting   Lower Body Dressing: Maximal assistance;Sit to/from stand   Toilet Transfer: Minimal assistance;Stand-pivot;Rolling walker (2 wheels) Toilet Transfer Details (indicate cue type and reason): simulated from recliner to EOB  Functional mobility during ADLs: Minimal assistance;Rolling walker (2 wheels);Cueing for safety;Cueing for sequencing General ADL Comments: limited by pain     Vision   Vision  Assessment?: No apparent visual deficits     Perception     Praxis      Pertinent Vitals/Pain Pain Assessment Pain Assessment: Faces Faces Pain Scale: Hurts even more Pain Location: L hip Pain Descriptors / Indicators: Sharp, Operative site guarding Pain Intervention(s): Limited activity within patient's tolerance, Monitored during session, Repositioned     Hand Dominance Right   Extremity/Trunk Assessment Upper Extremity Assessment Upper Extremity Assessment: Overall WFL for tasks assessed   Lower Extremity Assessment Lower Extremity Assessment: Defer to PT evaluation LLE Deficits / Details: Pain limiting strength and hip and knee ROM   Cervical / Trunk Assessment Cervical / Trunk Assessment: Other exceptions Cervical / Trunk Exceptions: compression fx T5   Communication Communication Communication: No difficulties   Cognition Arousal/Alertness: Awake/alert Behavior During Therapy: WFL for tasks assessed/performed Overall Cognitive Status: Within Functional Limits for tasks assessed                                       General Comments  mother present and supportive    Exercises     Shoulder Instructions      Home Living Family/patient expects to be discharged to:: Private residence Living Arrangements: Children;Spouse/significant other (67 y/o son) Available Help at Discharge: Family;Available 24 hours/day Type of Home: House Home Access: Stairs to enter [employer] of Steps: 3 Entrance Stairs-Rails: Right;Left Home Layout: One level     Bathroom Shower/Tub: Occupational psychologist: Standard Bathroom Accessibility: Yes   Home Equipment: Shower seat - built in;Grab bars - tub/shower   Additional Comments: spouse works daytime hours, but mother can assist as well as needed      Prior Functioning/Environment Prior Level of Function : Driving;Independent/Modified Independent;Working/employed              Mobility Comments: No AD. No falls. 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        OT Problem List: Decreased strength;Decreased activity tolerance;Impaired balance (sitting and/or standing);Decreased safety awareness;Decreased knowledge of use of DME or AE;Decreased knowledge of precautions;Pain      OT Treatment/Interventions: Self-care/ADL training;Therapeutic exercise;DME and/or AE instruction;Therapeutic activities;Patient/family education;Balance training    OT Goals(Current goals can be found in the care plan section) Acute Rehab OT Goals Patient Stated Goal: less pain OT Goal Formulation: With patient Time For Goal Achievement: 08/15/21 Potential to Achieve Goals: Good  OT Frequency: Min 2X/week    Co-evaluation              AM-PAC OT "6 Clicks" Daily Activity     Outcome Measure Help from another person eating meals?: None Help from another person taking care of personal grooming?: A Little Help from another person toileting, which includes using toliet, bedpan, or urinal?: A Lot Help from another person bathing (including washing, rinsing, drying)?: A Lot Help from another person to put on and taking off regular upper body clothing?: A Little Help from another person to put on and taking off regular lower body clothing?: A Lot 6 Click Score: 16   End of Session Equipment Utilized During Treatment: Rolling walker (2 wheels);Gait belt Nurse Communication: Mobility status  Activity Tolerance: Patient tolerated treatment well Patient left: in bed;with call bell/phone within reach;with bed alarm set;with  family/visitor present  OT Visit Diagnosis: Other abnormalities of gait and mobility (R26.89);Muscle weakness (generalized) (M62.81);Pain                Time: 3817-7116 OT Time Calculation (min): 13 min Charges:  OT General Charges $OT Visit: 1 Visit OT Evaluation $OT Eval Moderate Complexity: 1 Mod  Jolaine Artist, OT Acute Rehabilitation  Services Pager [phone] Office [phone]   Delight Stare 08/01/2021, 1:45 PM

## 2021-08-02 DIAGNOSIS — E279 Disorder of adrenal gland, unspecified: Secondary | ICD-10-CM | POA: Diagnosis present

## 2021-08-02 DIAGNOSIS — K769 Liver disease, unspecified: Secondary | ICD-10-CM | POA: Diagnosis present

## 2021-08-02 DIAGNOSIS — S22000A Wedge compression fracture of unspecified thoracic vertebra, initial encounter for closed fracture: Secondary | ICD-10-CM | POA: Diagnosis present

## 2021-08-02 DIAGNOSIS — C7951 Secondary malignant neoplasm of bone: Secondary | ICD-10-CM | POA: Diagnosis present

## 2021-08-02 DIAGNOSIS — I469 Cardiac arrest, cause unspecified: Secondary | ICD-10-CM | POA: Diagnosis not present

## 2021-08-02 DIAGNOSIS — R338 Other retention of urine: Secondary | ICD-10-CM | POA: Diagnosis not present

## 2021-08-02 DIAGNOSIS — E875 Hyperkalemia: Secondary | ICD-10-CM

## 2021-08-02 LAB — CBC
HCT: 23.7 % — ABNORMAL LOW (ref 39.0–52.0)
Hemoglobin: 7.9 g/dL — ABNORMAL LOW (ref 13.0–17.0)
MCH: 29.5 pg (ref 26.0–34.0)
MCHC: 33.3 g/dL (ref 30.0–36.0)
MCV: 88.4 fL (ref 80.0–100.0)
Platelets: 184 10*3/uL (ref 150–400)
RBC: 2.68 MIL/uL — ABNORMAL LOW (ref 4.22–5.81)
RDW: 13.3 % (ref 11.5–15.5)
WBC: 17.9 10*3/uL — ABNORMAL HIGH (ref 4.0–10.5)
nRBC: 0.2 % (ref 0.0–0.2)

## 2021-08-02 LAB — BASIC METABOLIC PANEL
Anion gap: 9 (ref 5–15)
BUN: 71 mg/dL — ABNORMAL HIGH (ref 6–20)
CO2: 24 mmol/L (ref 22–32)
Calcium: 9.1 mg/dL (ref 8.9–10.3)
Chloride: 90 mmol/L — ABNORMAL LOW (ref 98–111)
Creatinine, Ser: 2.42 mg/dL — ABNORMAL HIGH (ref 0.61–1.24)
GFR, Estimated: 30 mL/min — ABNORMAL LOW (ref >60)
Glucose, Bld: 121 mg/dL — ABNORMAL HIGH (ref 70–99)
Potassium: 4.4 mmol/L (ref 3.5–5.1)
Sodium: 123 mmol/L — ABNORMAL LOW (ref 135–145)

## 2021-08-02 LAB — GLUCOSE, CAPILLARY
Glucose-Capillary: 442 mg/dL — ABNORMAL HIGH (ref 70–99)
Glucose-Capillary: 69 mg/dL — ABNORMAL LOW (ref 70–99)

## 2021-08-04 LAB — SURGICAL PATHOLOGY

## 2021-08-04 LAB — PTH-RELATED PEPTIDE: PTH-related peptide: <2 pmol/L

## 2021-08-05 NOTE — Death Summary Note (Signed)
DEATH SUMMARY   Patient Details  Name: Michael Fritz MRN: [id number] DOB: 04/14/1965 HQI:[id number], Michael Snare, MD Admission/Discharge Information   Admit Date:  08-06-21  Date of Death: Date of Death: 2021/08/12  Time of Death: Time of Death: 0537  Length of Stay: 6   Principle Cause of death: Cardiac arrest  Hospital Diagnoses: Principal Problem:   Hyperkalemia Active Problems:   Type 2 diabetes mellitus without complication ([place])   Hyperlipidemia associated with type 2 diabetes mellitus ([place])   Tobacco abuse   Essential hypertension   Hypercalcemia   AKI (acute kidney injury) ([place])   Lung mass   Hyponatremia   Protein-calorie malnutrition, severe   Liver lesion   Lesion of adrenal gland ([facility])   Thoracic compression fracture ([facility])   Cancer, metastatic to bone ([place])   Acute urinary retention   Cardiac arrest ([place])  History of present illness: Michael Fritz is a 56 y.o. male with past medical history significant for essential hypertension, insulin-dependent diabetes mellitus, hyperlipidemia, tobacco abuse disorder who presented to [facility] ED by direction of his primary care office for abnormal labs.  Patient reports generalized weakness, unintentional weight loss of 12 pounds over the last several months.  Also complaining of left-sided hip pain that is worse with ambulation.  Patient denies any chest pain, no abdominal pain, no headache, no visual changes.  Day prior, patient went to see his PCP ordered blood work that showed a calcium of 12.9 and was told to come to the ED for further evaluation.   In the ED, temperature 97.7 F, HR 63, RR 17, BP 120/53, SPO2 90% on room air.  WBC 20.1, hemoglobin 10.9, platelets 269.  Sodium 132, potassium 5.3, chloride 99, CO2 25, glucose 318, BUN 28, creatinine 2.28, calcium 14.0, AST 32, ALT 38, total bilirubin 0.8.  Urinalysis unrevealing.  Chest x-ray with stable dense right upper lobe consolidation compatible with pneumonia versus  mass.  CT chest/abdomen/pelvis without contrast with mass involving majority of right upper lobe with soft tissue extending along the right upper lobe and right mainstem bronchus along the carina into the AP window concerning for primary lung malignancy, widespread osseous metastatic disease with osseous lesion involving left femoral head and neck cortex which concerning for impending pathologic fracture, moderate right pleural effusion, multiple hypoechoic masses throughout the liver, fullness left adrenal gland concerning for metastases, pathologic compression fracture T5.  Hospitalist service consulted for admission for likely primary lung cancer with metastatic disease, hypercalcemia of malignancy, acute renal failure.   Hospital Course:   Cardiac arrest On the morning of 2021/08/12, roughly 5:12 AM, nursing staff noted patient was unresponsive while attempting routine vital signs.  Last known normal was roughly 5:00 AM when the nurse tech was giving patient a bath.  CPR was initiated and a CODE BLUE was called.  Patient received 6 rounds of epinephrine, 3 doses of bicarb and 1 calcium chloride.  Patient remained asystole during cardiopulmonary resuscitation and patient was pronounced dead at 0537.  Spouse was present at event and consoled with the assistance of chaplain services.  Right upper lobe mass concerning for primary lung malignancy with metastasis to liver, left adrenal, bone CT chest/abdomen/pelvis without contrast with mass involving majority of right upper lobe with soft tissue extending along the right upper lobe and right mainstem bronchus along the carina into the AP window concerning for primary lung malignancy, widespread osseous metastatic disease with osseous lesion involving left femoral head and neck cortex which concerning for  impending pathologic fracture, moderate right pleural effusion, multiple hypoechoic masses throughout the liver, fullness left adrenal gland concerning for  metastases, pathologic compression fracture T5.  Oncology was consulted and was following peripherally awaiting pathology results from the left resected femur.  Unfortunately patient passed as above.   Impending pathologic left femur fracture s/p THA CT pelvis without contrast with focal lytic destruction involving the left femoral neck and head consistent with metastatic disease.  Patient underwent anterior total hip arthroplasty on 07/15/2021 by Dr. Lyla Glassing.   Postoperative blood loss anemia Patient's hemoglobin dropped from 10.9 on admission to 6.8 postoperatively and received 1 unit PRBC with adequate response.   Pathologic T5/T7 compression fracture IR was consulted for consideration following for T5/7 biopsy/ablation/kyphoplasty   Hypercalcemia of malignancy Calcium 14.0 on arrival, etiology likely secondary to diffuse malignancy from likely primary lung cancer.  Patient received IV pamidronate on 07/28/2021 with IV Decadron and calcitonin which have now been titrated off given the normalization of calcium level.   Acute renal failure Creatinine 2.28 on admission.  Etiology likely secondary to hypercalcemia.  Uric acid level within normal limits.  No urine obstruction noted on CT abdomen/pelvis.  Patient was supported with IV fluid resuscitation and his home metformin/ARB were held.   Hyperkalemia: Resolved Treated with insulin and Lokelma with resolution.   Pleural effusion, likely malignant Underwent thoracentesis on 07/28/2021 by IR with 750 mL of fluid removed.  Cytology with no malignant cells identified, although low yield.   Hyponatremia Suspect etiology related to lung cancer with SIADH.  Also complicated by elevated glucose in the setting of IV dexamethasone use for treatment of hypercalcemia during initial hospitalization..  Type 2 diabetes with hyperglycemia Hemoglobin A1c 12.2 on 07/26/2021, poorly controlled.  Likely complicated by current steroid use for hypercalcemia of  malignancy as above.  Home medications include Lantus and metformin.  Diabetic educator followed during hospital course.   Essential hypertension Home medication includes losartan 160 mg p.o. daily which was held during hospitalization due to acute renal failure.  Patient was started on amlodipine 10 mg p.o. daily.   Hyperlipidemia Currently not taking statin at home.   Tobacco use disorder Counseled on need for complete cessation given new diagnosis of likely metastatic lung cancer.  Patient is supported with nicotine patch    Procedures:  Thoracentesis 5/24 Anterior approach total hip arthroplasty, left; Dr. Lyla Glassing 08/03/2021  Consultations:  Oncology, Dr. Alen Blew Orthopedics, Dr. Lyla Glassing Interventional radiology  The results of significant diagnostics from this hospitalization (including imaging, microbiology, ancillary and laboratory) are listed below for reference.   Significant Diagnostic Studies: DG Chest 1 View  Result Date: 07/28/2021 CLINICAL DATA:  Post thoracentesis right side EXAM: CHEST  1 VIEW COMPARISON:  CT 07/06/2021 FINDINGS: Decreased right pleural effusion after thoracentesis. Unchanged right upper lung mass and consolidation. Left lung is clear. No pneumothorax. Multiple osseous metastatic lesions, better visualized on recent CT. IMPRESSION: Decreased right pleural effusion after thoracentesis. No evidence of pneumothorax. Unchanged right upper lung mass and surrounding lung consolidation. Electronically Signed   By: Maurine Simmering M.D.   On: 07/28/2021 13:24   DG Chest 2 View  Result Date: 07/21/2021 CLINICAL DATA:  Dyspnea EXAM: CHEST - 2 VIEW COMPARISON:  Chest radiograph from one day prior. FINDINGS: Stable cardiomediastinal silhouette with normal heart size. No pneumothorax. Stable trace bilateral pleural effusions. Dense right upper lobe consolidation is stable. Clear left lung. IMPRESSION: 1. Stable dense right upper lobe consolidation compatible with  pneumonia. Underlying obstructing right upper lung  lesion not excluded. Continued follow-up chest radiographs recommended at a minimum to document resolution. Chest CT with IV contrast may be considered. 2. Stable trace bilateral pleural effusions. Electronically Signed   By: Ilona Sorrel M.D.   On: 07/22/2021 10:56   DG Chest 2 View  Result Date: 07/26/2021 CLINICAL DATA:  Cough EXAM: CHEST - 2 VIEW COMPARISON:  Report 05/13/2011 FINDINGS: Small bilateral pleural effusions. Airspace disease in the right upper lobe. Normal cardiac size. No pneumothorax. IMPRESSION: 1. Airspace disease in the right upper lobe could be due to pneumonia but pulmonary mass or central lesion is also possible. Consider correlation with contrast-enhanced chest CT 2. Small bilateral effusions Electronically Signed   By: Donavan Foil M.D.   On: 07/26/2021 17:18   DG Lumbar Spine Complete  Result Date: 07/26/2021 CLINICAL DATA:  Provided history: Low back pain. Additional history provided: Cough since February, low back pain, bilateral hip pain. EXAM: LUMBAR SPINE - COMPLETE 4+ VIEW COMPARISON:  No pertinent prior exams available for comparison. FINDINGS: Five lumbar vertebrae. The caudal most well-formed intervertebral disc space is designated L5-S1. Minimal grade 1 retrolisthesis at T12-L1, L1-L2, L2-L3, L3-L4 and L5-S1. No lumbar vertebral compression fracture. No more than mild disc space narrowing within the lumbar spine. Facet arthrosis, greatest at L4-L5 and L5-S1. Large stool burden within the colon and rectum. Aortic atherosclerosis. IMPRESSION: No lumbar vertebral compression fracture. Minimal grade 1 retrolisthesis at T12-L1, L1-L2, L2-L3, L3-L4 and L5-S1. Lumbar spondylosis, as described. Large stool burden within the colon and rectum, suggesting constipation. Aortic Atherosclerosis (ICD10-I70.0). Electronically Signed   By: Kellie Simmering D.O.   On: 07/26/2021 17:18   CT PELVIS WO CONTRAST  Result Date:  07/28/2021 CLINICAL DATA:  Pelvic fracture. EXAM: CT PELVIS WITHOUT CONTRAST TECHNIQUE: Multidetector CT imaging of the pelvis was performed following the standard protocol without intravenous contrast. RADIATION DOSE REDUCTION: This exam was performed according to the departmental dose-optimization program which includes automated exposure control, adjustment of the mA and/or kV according to patient size and/or use of iterative reconstruction technique. COMPARISON:  Jul 27, 2021. FINDINGS: Urinary Tract: No definite abnormality seen involving the urinary bladder. Bowel:  Unremarkable visualized pelvic bowel loops. Vascular/Lymphatic: No pathologically enlarged lymph nodes. No significant vascular abnormality seen. Reproductive:  Prostate gland is unremarkable. Other:  No definite evidence of hernia or ascites is noted. Musculoskeletal: There is again noted lytic destruction involving the left femoral head and neck anteriorly consistent with metastatic disease. Sclerotic density is seen involving the posterior portion of the left acetabulum as well as iliac bone adjacent to the left sacroiliac joint consistent with metastatic disease. Sclerotic densities are also noted in the visualized lumbar vertebral bodies and sacrum concerning for metastatic disease. IMPRESSION: Focal lytic destruction involving the left femoral neck and head is again noted consistent with metastatic disease. Sclerotic densities are again noted involving the left acetabulum and iliac bone, as well as visualized lumbar vertebral bodies and sacrum concerning for metastatic disease. Electronically Signed   By: Marijo Conception M.D.   On: 07/28/2021 09:33   MR THORACIC SPINE WO CONTRAST  Result Date: 07/07/2021 CLINICAL DATA:  Compression fracture EXAM: MRI THORACIC SPINE WITHOUT CONTRAST TECHNIQUE: Multiplanar, multisequence MR imaging of the thoracic spine was performed. No intravenous contrast was administered. COMPARISON:  No prior MRI,  correlation is made with CT chest abdomen pelvis 07/20/2021 FINDINGS: Alignment:  Physiologic. Vertebrae: Abnormal marrow signal is seen at every level, with decreased T1 and increased T2 signal throughout the T1,  T3, T7, T10, T12, and L1 vertebral bodies, with the remaining thoracic spine demonstrating more focal lesions than diffuse involvement. 50% vertebral body height loss in T5, with bowing of the posterior cortex, most likely an acute to subacute pathologic fracture. 15% vertebral body height loss in T7, without definite bowing of the posterior cortex, which may also represent a pathologic fracture of indeterminate acuity. Lesser vertebral body height loss at T8, T9, and T10 does not appear acute. Multifocal involvement of the posterior elements. Abnormal signal is also noted in the imaged portions of the proximal left second sixth, eighth, tenth, and twelfth ribs and proximal right seventh, eighth and tenth ribs. Cord:  Normal signal and morphology. Paraspinal and other soft tissues: Mass involving the majority of the right upper lobe is better evaluated on the 07/22/2021 CT chest abdomen pelvis. Small pleural effusions, decreased on the right and stable to slightly increased on the left. T2 hyperintense lesions in the liver, which could represent metastatic disease but could also be cystic. Disc levels: No spinal canal stenosis. The bowing of the posterior cortex of T5 narrows the spinal canal somewhat but does not cause significant stenosis. No significant neural foraminal narrowing. IMPRESSION: 1. 50% vertebral body height loss at T5, with bowing of the posterior cortex, most likely an acute to subacute pathologic fracture. 2. 15% vertebral body height loss at T7, which may represent a pathologic fracture of indeterminate acuity. 3. Diffusely abnormal marrow signal, concerning for metastatic disease. 4. Right lung mass and hepatic lesions are better evaluated on the prior CT. 5. Interval decrease in the  size of the previously noted right pleural effusion, with stable or slightly increased in size left pleural effusion. Electronically Signed   By: Merilyn Baba M.D.   On: 07/11/2021 01:04   MR FEMUR LEFT WO CONTRAST  Result Date: 07/28/2021 CLINICAL DATA:  Metastatic disease evaluation EXAM: MR OF THE LEFT FEMUR WITHOUT CONTRAST TECHNIQUE: Multiplanar, multisequence MR imaging of the left femur was performed. No intravenous contrast was administered. COMPARISON:  Pelvis CT 07/28/2021. FINDINGS: Bones/Joint/Cartilage There are T2 hyperintense/T1 hypointense metastases within the proximal femurs bilaterally, including in the femoral heads, necks, intertrochanteric, and subtrochanteric regions. There is a lytic destructive lesion of the left femoral neck which involves up to 40% of the femoral neck width, at high risk for pathologic fracture. There is a large metastasis in the proximal right subtrochanteric femur which is also likely high risk for pathologic fracture, partially imaged only in the coronal plane but involving a significant portion of the subtrochanteric femur width. Partially visualized osseous metastatic lesions throughout the pelvis, most confluent in the left posterior acetabulum and ischial tuberosity. Muscles and Tendons There is feathery intramuscular edema within the hip adductors bilaterally, likely reactive. There is inter fascial edema throughout the left thigh without focal collection. Soft tissues Subcutaneous edema of the left thigh. There is trace nonspecific free fluid in the pelvis, partially visualized. IMPRESSION: Multiple osseous metastases within the proximal femurs bilaterally, most concerning in the left femoral neck and right subtrochanteric femur, both high risk for pathologic fracture. Partially visualized osseous metastatic disease throughout the pelvis, most confluent in the left posterior acetabulum. Electronically Signed   By: Maurine Simmering M.D.   On: 07/28/2021 13:22    DG C-Arm 1-60 Min-No Report  Result Date: 07/09/2021 Fluoroscopy was utilized by the requesting physician.  No radiographic interpretation.   DG C-Arm 1-60 Min-No Report  Result Date: 07/19/2021 Fluoroscopy was utilized by the requesting physician.  No  radiographic interpretation.   DG C-Arm 1-60 Min-No Report  Result Date: 07/06/2021 Fluoroscopy was utilized by the requesting physician.  No radiographic interpretation.   DG C-Arm 1-60 Min-No Report  Result Date: 07/15/2021 Fluoroscopy was utilized by the requesting physician.  No radiographic interpretation.   DG HIP UNILAT WITH PELVIS 1V LEFT  Result Date: 08/03/2021 CLINICAL DATA:  Total hip hemiarthroplasty EXAM: DG HIP (WITH OR WITHOUT PELVIS) 1V*L* COMPARISON:  07/26/2021 FINDINGS: Changes of left hip hemiarthroplasty. Normal AP alignment. No hardware bony complicating feature. IMPRESSION: Intraoperative imaging as above.  No complicating feature. Electronically Signed   By: Rolm Baptise M.D.   On: 07/10/2021 20:18   DG HIP UNILAT WITH PELVIS 2-3 VIEWS LEFT  Result Date: 07/30/2021 CLINICAL DATA:  Left hip replacement EXAM: DG HIP (WITH OR WITHOUT PELVIS) 1V LEFT COMPARISON:  None Available. FINDINGS: Interval postsurgical changes from left total hip arthroplasty. Arthroplasty components appear in their expected alignment. No periprosthetic fracture is identified. Expected postoperative changes within the overlying soft tissues. IMPRESSION: Expected postsurgical changes of left total hip arthroplasty. Electronically Signed   By: Yetta Glassman M.D.   On: 07/30/2021 11:27   DG HIPS BILAT WITH PELVIS MIN 5 VIEWS  Result Date: 07/26/2021 CLINICAL DATA:  Hip pain EXAM: DG HIP (WITH OR WITHOUT PELVIS) 5+V BILAT COMPARISON:  None Available. FINDINGS: No fracture or malalignment. SI joints are patent. Pubic symphysis is intact. There are mild degenerative changes of both hips. IMPRESSION: Mild degenerative changes.  No acute osseous  abnormality Electronically Signed   By: Donavan Foil M.D.   On: 07/26/2021 17:17   CT CHEST ABDOMEN PELVIS WO CONTRAST  Result Date: 07/28/2021 CLINICAL DATA:  lung mass, weight loss, lumbar/hip pain EXAM: CT CHEST, ABDOMEN AND PELVIS WITHOUT CONTRAST TECHNIQUE: Multidetector CT imaging of the chest, abdomen and pelvis was performed following the standard protocol without IV contrast. RADIATION DOSE REDUCTION: This exam was performed according to the departmental dose-optimization program which includes automated exposure control, adjustment of the mA and/or kV according to patient size and/or use of iterative reconstruction technique. COMPARISON:  None Available. FINDINGS: Evaluation is limited with lack of IV contrast. CT CHEST FINDINGS Cardiovascular: Heart is the upper limits of normal in size. Mild LEFT-sided coronary artery atherosclerotic calcifications. Small pericardial effusion. Aorta is normal in course and caliber. Scattered atherosclerotic calcifications. Mediastinum/Nodes: Visualized thyroid is unremarkable. Mildly prominent LEFT subpectoral axillary lymph node measures 9 mm in the short axis (series 4, image 51). There is mediastinal adenopathy. Representative rounded precarinal lymph node measures 11 mm (series 4, image 74). Evaluation for hilar adenopathy is limited secondary to lack of IV contrast. Ill-defined soft tissue encases the RIGHT mainstem bronchus and extends inferior to the carina and along the course of the trachea. This ill-defined soft tissue is seen within the AP window. Lungs/Pleura: Moderate RIGHT pleural effusion. Trace LEFT pleural effusion. Irregular consolidative opacity throughout the majority of the RIGHT upper lobe with interlobular septal thickening. Masslike consolidation centrally measures approximately 7.5 x 6.4 cm (series 6, image 61). It demonstrates mass effect on the RIGHT upper lobe bronchi and occludes them superiorly. Irregular soft tissue extends into the  RIGHT middle and lower lobe with ill-defined soft tissue seen coursing along the RIGHT mainstem bronchus. Patchy LEFT basilar irregular centrilobular nodular opacities, nonspecific. Musculoskeletal: Infiltrative osseous metastatic disease is favored to involve every thoracic vertebral body and many of the visualized cervical vertebral bodies. Pathologic compression fracture deformity of T5. Lesions involve multiple bilateral ribs with scattered  the pathologic fractures noted including the LEFT-sided fifth rib and RIGHT-sided eleventh rib. There are 13 thoracic type vertebral bodies. CT ABDOMEN PELVIS FINDINGS Hepatobiliary: There are multiple incompletely characterized hypoechoic masses with representative incompletely characterized hypoechoic mass of the RIGHT liver (series 3, image 75). These are incompletely characterized given lack of IV contrast. Gallbladder is unremarkable. Pancreas: No peripancreatic fat stranding. Spleen: Unremarkable. Adrenals/Urinary Tract: Fullness of the LEFT adrenal gland. No hydronephrosis. No obstructing nephrolithiasis. Bladder is unremarkable. Stomach/Bowel: No evidence of bowel obstruction. Moderate colonic stool burden predominately within the RIGHT hemicolon. Appendix is normal. Vascular/Lymphatic: Atherosclerotic calcifications of the nonaneurysmal abdominal aorta. No definitive intra-abdominal adenopathy is identified in the limitations of this noncontrast exam. Reproductive: Prostate is present Other: No free air.  No free fluid. Musculoskeletal: Soft tissue mass involving the LEFT femoral neck and eroding the cortex anteriorly (series 4, image 310). It places patient at the risk for pathologic fracture. Infiltrative metastatic disease involving the LEFT acetabulum, pelvis, sacrum and all of the lumbar vertebral bodies. IMPRESSION: 1. Mass involving the majority of the RIGHT upper lobe with soft tissue extending along the RIGHT upper lobe and RIGHT mainstem bronchus, along the  carina and into the AP window. This is concerning for primary lung malignancy. 2. Widespread osseous metastatic disease. Of note there is an osseous lesion involving the LEFT femoral head and neck cortex which places the patient at risk for impending pathologic fracture. Recommend consultation with Orthopedic surgery. 3. Moderate RIGHT pleural effusion, possibly malignant. 4. Multiple hypoechoic masses throughout the liver, incompletely assessed given lack of IV contrast. These are concerning for hepatic metastatic disease. 5. Fullness of the LEFT adrenal gland, likely adrenal metastatic disease. 6. There may be a postobstructive infectious or atelectatic component within the RIGHT upper lobe. Lymphangitic spread remains a differential consideration. 7. Scattered LEFT basilar centrilobular nodular opacities are nonspecific and could be infectious or inflammatory in etiology versus reflect additional metastatic disease. 8. Pathologic compression fracture of T5. These results were called by telephone at the time of interpretation on 07/25/2021 at 2:26 pm to provider MELANIE BELFI , who verbally acknowledged these results. Electronically Signed   By: Valentino Saxon M.D.   On: 07/10/2021 14:39   IR THORACENTESIS ASP PLEURAL SPACE W/IMG GUIDE  Result Date: 07/28/2021 INDICATION: Patient was recently diagnosed right upper lobe lung mass, right pleural effusion. Request IR for diagnostic and therapeutic right thoracentesis EXAM: ULTRASOUND GUIDED RIGHT THORACENTESIS MEDICATIONS: 6 mL 1% lidocaine COMPLICATIONS: None immediate. PROCEDURE: An ultrasound guided thoracentesis was thoroughly discussed with the patient and questions answered. The benefits, risks, alternatives and complications were also discussed. The patient understands and wishes to proceed with the procedure. Written consent was obtained. Ultrasound was performed to localize and mark an adequate pocket of fluid in the right chest. The area was then  prepped and draped in the normal sterile fashion. 1% Lidocaine was used for local anesthesia. Under ultrasound guidance a 6 Fr Safe-T-Centesis catheter was introduced. Thoracentesis was performed. The catheter was removed and a dressing applied. FINDINGS: A total of approximately 750 mL of hazy yellow fluid was removed. Samples were sent to the laboratory as requested by the clinical team. IMPRESSION: Successful ultrasound guided right thoracentesis yielding 750 mL of pleural fluid. Read by Candiss Norse, PA-C Electronically Signed   By: Aletta Edouard M.D.   On: 07/28/2021 12:20    Microbiology: Recent Results (from the past 240 hour(s))  Surgical pcr screen     Status: None   Collection Time:  07/18/2021  3:23 PM   Specimen: Nasal Mucosa; Nasal Swab  Result Value Ref Range Status   MRSA, PCR NEGATIVE NEGATIVE Final   Staphylococcus aureus NEGATIVE NEGATIVE Final    Comment: (NOTE) The Xpert SA Assay (FDA approved for NASAL specimens in patients 80 years of age and older), is one component of a comprehensive surveillance program. It is not intended to diagnose infection nor to guide or monitor treatment. Performed at [facility], [place] [address] [place], [place] [address]     Time spent: 36 minutes  Signed: Royalti Schauf J [place] DO 08-19-2021

## 2021-08-05 NOTE — Progress Notes (Signed)
VAST response to code blue. RN reports pt has 2 PIV already. VAST standing by.

## 2021-08-05 NOTE — Progress Notes (Signed)
[facility] responded to CODE BLUE page on 6N at 5:26am; when [facility] arrived medical team were performing CPR; pt.'s wife sitting in chair down the hallway with staff present with her.  CH remained present with wife as MD gave updates on pt.'s condition and when MD ultimately shared news that pt. had passed.  Wife returned to pt.'s room and sat at bedside, tearful and distraught.  Wife contacted pt.'s mother and her two sons to come see pt. Wife shared that pt. was diagnosed with stage IV cancer very recently, following a trip she and pt. took to [place] for their 30th wedding anniversary.  Pt. underwent hip surgery earlier this week and was scheduled for back surgery later this admission.  Ava allowed wife space and time to grieve at bedside but remained on unit until additional family arrived.  No needs shared at this time but family are aware of chaplains' availability if needed.  Lindaann Pascal, Chaplain Pager: [phone]

## 2021-08-05 NOTE — Progress Notes (Signed)
0500 Patient was given CHG by NT and still responding per NT  0512 NT went back to room to do routine VS and patient was found unresponsive. Wife was at bedside at that time.  6381 CPR/BLS initiated. Code Blue was called and took over. Patient remained Asystole after 6 rounds of Epi, 3 Bicarb and 1 Ca Chloride.   [place]. [place],MD pronounced patient's death and notified spouse with Chaplain as support.  70 J. Mansy, MD was notified about the patient's death.  0619 called [facility] and notified time of death.

## 2021-08-05 NOTE — Code Documentation (Signed)
  Patient Name: Michael Fritz   MRN: [id number]   Date of Birth/ Sex: 1965/09/06 , male      Admission Date: 07/10/2021  Attending Provider: [place] Eric J, DO  Primary Diagnosis: Hypercalcemia [E83.52] Lung mass [R91.8] AKI (acute kidney injury) ([place]) [N17.9] Metastatic malignant neoplasm, unspecified site [facility]) [C79.9]   Indication: Pt was in his usual state of health until this AM, when he was noted to be unresponsive. Code blue was subsequently called. At the time of arrival on scene, ACLS protocol was underway.   Technical Description:  - CPR performance duration:  23 minutes  - Was defibrillation or cardioversion used? No   - Was external pacer placed? Yes  - Was patient intubated pre/post CPR? Yes   Medications Administered: Y = Yes; Blank = No Amiodarone    Atropine    Calcium    Epinephrine  Y  Lidocaine    Magnesium    Norepinephrine    Phenylephrine    Sodium bicarbonate  Y  Vasopressin    Other    Post CPR evaluation:  - Final Status - Was patient successfully resuscitated ? No   Miscellaneous Information:  - Time of death:  5:37 AM  - Primary team notified?  Yes  - Family Notified? Phylliss Bob, MD   [facility] PGY-3 08-09-2021, 5:43 AM

## 2021-08-05 DEATH — deceased

## 2021-08-26 MED FILL — Medication: Qty: 1 | Status: AC

## 2021-09-27 ENCOUNTER — Ambulatory Visit: Payer: 59 | Admitting: Family Medicine

## 2022-01-12 ENCOUNTER — Ambulatory Visit: Payer: 59 | Admitting: Urology
